# Patient Record
Sex: Male | Born: 1935 | Race: White | Hispanic: No | Smoking: Former smoker
Health system: Southern US, Community
[De-identification: ages and names within clinical notes are randomized; demographics above are authoritative.]

## PROBLEM LIST (undated history)

## (undated) DIAGNOSIS — E079 Disorder of thyroid, unspecified: Secondary | ICD-10-CM

## (undated) DIAGNOSIS — E039 Hypothyroidism, unspecified: Secondary | ICD-10-CM

## (undated) DIAGNOSIS — K219 Gastro-esophageal reflux disease without esophagitis: Secondary | ICD-10-CM

## (undated) DIAGNOSIS — I6529 Occlusion and stenosis of unspecified carotid artery: Secondary | ICD-10-CM

## (undated) DIAGNOSIS — I10 Essential (primary) hypertension: Secondary | ICD-10-CM

## (undated) DIAGNOSIS — F0390 Unspecified dementia without behavioral disturbance: Secondary | ICD-10-CM

## (undated) HISTORY — DX: Essential (primary) hypertension: I10

## (undated) HISTORY — DX: Gastro-esophageal reflux disease without esophagitis: K21.9

## (undated) HISTORY — PX: COLONOSCOPY: SHX174

## (undated) HISTORY — DX: Occlusion and stenosis of unspecified carotid artery: I65.29

## (undated) HISTORY — PX: INGUINAL HERNIA REPAIR: SHX194

## (undated) HISTORY — PX: EYE SURGERY: SHX253

## (undated) HISTORY — PX: ESOPHAGOGASTRODUODENOSCOPY: SHX1529

## (undated) HISTORY — PX: LIPOMA EXCISION: SHX5283

## (undated) HISTORY — PX: MELANOMA EXCISION: SHX5266

## (undated) HISTORY — DX: Disorder of thyroid, unspecified: E07.9

## (undated) HISTORY — PX: REPLACEMENT TOTAL KNEE BILATERAL: SUR1225

---

## 2000-06-13 ENCOUNTER — Encounter: Payer: Self-pay | Admitting: General Surgery

## 2000-06-13 ENCOUNTER — Encounter: Admission: RE | Admit: 2000-06-13 | Discharge: 2000-06-13 | Payer: Self-pay | Admitting: General Surgery

## 2000-06-14 ENCOUNTER — Encounter (INDEPENDENT_AMBULATORY_CARE_PROVIDER_SITE_OTHER): Payer: Self-pay | Admitting: Specialist

## 2000-06-14 ENCOUNTER — Ambulatory Visit (HOSPITAL_BASED_OUTPATIENT_CLINIC_OR_DEPARTMENT_OTHER): Admission: RE | Admit: 2000-06-14 | Discharge: 2000-06-14 | Payer: Self-pay | Admitting: General Surgery

## 2009-08-08 ENCOUNTER — Inpatient Hospital Stay (HOSPITAL_COMMUNITY): Admission: RE | Admit: 2009-08-08 | Discharge: 2009-08-11 | Payer: Self-pay | Admitting: Orthopedic Surgery

## 2011-01-06 LAB — BASIC METABOLIC PANEL
BUN: 15 mg/dL (ref 6–23)
BUN: 15 mg/dL (ref 6–23)
CO2: 27 mEq/L (ref 19–32)
CO2: 27 mEq/L (ref 19–32)
Calcium: 8.5 mg/dL (ref 8.4–10.5)
Chloride: 102 mEq/L (ref 96–112)
Creatinine, Ser: 1.49 mg/dL (ref 0.4–1.5)
GFR calc non Af Amer: 54 mL/min — ABNORMAL LOW (ref >60)
Glucose, Bld: 109 mg/dL — ABNORMAL HIGH (ref 70–99)
Glucose, Bld: 126 mg/dL — ABNORMAL HIGH (ref 70–99)

## 2011-01-06 LAB — COMPREHENSIVE METABOLIC PANEL
ALT: 25 U/L (ref 0–53)
AST: 26 U/L (ref 0–37)
Albumin: 3.7 g/dL (ref 3.5–5.2)
Alkaline Phosphatase: 71 U/L (ref 39–117)
BUN: 12 mg/dL (ref 6–23)
CO2: 28 mEq/L (ref 19–32)
Calcium: 9.4 mg/dL (ref 8.4–10.5)
Chloride: 103 mEq/L (ref 96–112)
Creatinine, Ser: 1.26 mg/dL (ref 0.4–1.5)
GFR calc Af Amer: >60 mL/min (ref >60)
GFR calc non Af Amer: 55 mL/min — ABNORMAL LOW (ref >60)
Glucose, Bld: 181 mg/dL — ABNORMAL HIGH (ref 70–99)
Potassium: 3.6 mEq/L (ref 3.5–5.1)
Sodium: 137 mEq/L (ref 135–145)
Total Bilirubin: 0.8 mg/dL (ref 0.3–1.2)
Total Protein: 7.3 g/dL (ref 6.0–8.3)

## 2011-01-06 LAB — CBC
HCT: 27.4 % — ABNORMAL LOW (ref 39.0–52.0)
HCT: 27.9 % — ABNORMAL LOW (ref 39.0–52.0)
HCT: 33.4 % — ABNORMAL LOW (ref 39.0–52.0)
HCT: 40.7 % (ref 39.0–52.0)
Hemoglobin: 13.6 g/dL (ref 13.0–17.0)
MCHC: 33.3 g/dL (ref 30.0–36.0)
MCHC: 33.9 g/dL (ref 30.0–36.0)
MCHC: 34 g/dL (ref 30.0–36.0)
MCV: 96.3 fL (ref 78.0–100.0)
MCV: 96.6 fL (ref 78.0–100.0)
MCV: 96.6 fL (ref 78.0–100.0)
Platelets: 136 10*3/uL — ABNORMAL LOW (ref 150–400)
Platelets: 154 10*3/uL (ref 150–400)
Platelets: 158 10*3/uL (ref 150–400)
Platelets: 167 10*3/uL (ref 150–400)
RBC: 4.21 MIL/uL — ABNORMAL LOW (ref 4.22–5.81)
RDW: 13.1 % (ref 11.5–15.5)
RDW: 13.2 % (ref 11.5–15.5)
RDW: 13.2 % (ref 11.5–15.5)
WBC: 11.4 10*3/uL — ABNORMAL HIGH (ref 4.0–10.5)
WBC: 5.7 10*3/uL (ref 4.0–10.5)

## 2011-01-06 LAB — PROTIME-INR
INR: 1.04 (ref 0.00–1.49)
INR: 1.64 — ABNORMAL HIGH (ref 0.00–1.49)
Prothrombin Time: 13.5 seconds (ref 11.6–15.2)
Prothrombin Time: 15.5 seconds — ABNORMAL HIGH (ref 11.6–15.2)
Prothrombin Time: 21.4 seconds — ABNORMAL HIGH (ref 11.6–15.2)

## 2011-01-06 LAB — URINALYSIS, ROUTINE W REFLEX MICROSCOPIC
Bilirubin Urine: NEGATIVE
Glucose, UA: 100 mg/dL — AB
Hgb urine dipstick: NEGATIVE
Ketones, ur: NEGATIVE mg/dL
Nitrite: NEGATIVE
Protein, ur: NEGATIVE mg/dL
Specific Gravity, Urine: 1.011 (ref 1.005–1.030)
Urobilinogen, UA: 0.2 mg/dL (ref 0.0–1.0)
pH: 6.5 (ref 5.0–8.0)

## 2011-01-06 LAB — APTT: aPTT: 28 seconds (ref 24–37)

## 2011-01-06 LAB — TYPE AND SCREEN: ABO/RH(D): O POS

## 2011-02-18 ENCOUNTER — Other Ambulatory Visit: Payer: Self-pay | Admitting: Orthopedic Surgery

## 2011-02-18 ENCOUNTER — Encounter (HOSPITAL_COMMUNITY): Payer: Medicare Other

## 2011-02-18 LAB — CBC
MCH: 30.2 pg (ref 26.0–34.0)
MCHC: 33.3 g/dL (ref 30.0–36.0)
MCV: 90.5 fL (ref 78.0–100.0)
Platelets: 172 10*3/uL (ref 150–400)
RBC: 4.54 MIL/uL (ref 4.22–5.81)
RDW: 13.2 % (ref 11.5–15.5)

## 2011-02-18 LAB — COMPREHENSIVE METABOLIC PANEL
ALT: 23 U/L (ref 0–53)
AST: 22 U/L (ref 0–37)
Alkaline Phosphatase: 96 U/L (ref 39–117)
CO2: 32 mEq/L (ref 19–32)
Chloride: 99 mEq/L (ref 96–112)
GFR calc Af Amer: 57 mL/min — ABNORMAL LOW (ref >60)
GFR calc non Af Amer: 47 mL/min — ABNORMAL LOW (ref >60)
Glucose, Bld: 90 mg/dL (ref 70–99)
Potassium: 4.1 mEq/L (ref 3.5–5.1)
Sodium: 140 mEq/L (ref 135–145)
Total Bilirubin: 0.3 mg/dL (ref 0.3–1.2)

## 2011-02-18 LAB — URINALYSIS, ROUTINE W REFLEX MICROSCOPIC
Bilirubin Urine: NEGATIVE
Ketones, ur: NEGATIVE mg/dL
Nitrite: NEGATIVE
Protein, ur: NEGATIVE mg/dL
pH: 7 (ref 5.0–8.0)

## 2011-02-18 LAB — TYPE AND SCREEN: ABO/RH(D): O POS

## 2011-02-18 LAB — PROTIME-INR: Prothrombin Time: 13.3 seconds (ref 11.6–15.2)

## 2011-02-19 NOTE — Op Note (Signed)
[place]. [facility]  Patient:    Christian Bennett, Christian Bennett                       MRN: [id number] Proc. Date: 06/14/00 Adm. Date:  [id number] Attending:  Arlis Porta CC:         Hadassah Pais. Jeannetta Nap, M.D.   Operative Report  PREOPERATIVE DIAGNOSES: 1. Left inguinal hernia. 2. Left back soft tissue mass.  POSTOPERATIVE DIAGNOSES: 1. Indirect left inguinal hernia. 2. Left back lipomatous soft tissue mass.  PROCEDURE: 1. Left inguinal hernia repair with mesh. 2. Excision of 7 cm left back soft tissue mass.  SURGEON:  Adolph Pollack, M.D.  ANESTHESIA:  Local with MAC.  INDICATIONS FOR PROCEDURE:  The patient is a 77 year old male, status post right inguinal hernia repair in the past who has noticed a left inguinal bulge that has becoming symptomatic.  He also has noted a soft tissue mass on his back and his daughter said it is getting larger.  He presents for operative management of both of these problems.  DESCRIPTION OF PROCEDURE:  He was placed supine on the operating table and given intravenous sedation.  The local groin area was shaved, and sterilely prepped and draped.  Local anesthetic consisting of 0.5% plain Marcaine plus 1% lidocaine with epinephrine plus sodium bicarbonate was infiltrated in the left groin region and an oblique incision was made sharply and carried down to the external oblique aponeurosis.  More local anesthetic was infiltrated deep to the external oblique aponeurosis, and it was incised, and its fibers were split through the external ring medically, and up toward the anterior-superior iliac spine laterally.  The upper midline ilioinguinal nerve was identified and preserved.  The shelving edge of the inguinal ligament inferiorly and the internal oblique muscle aponeurosis superiorly were exposed by using blunt dissection.  The spermatic cord was isolated using blunt dissection and direct hernia sac was dissected free from  the spermatic cord, opened, and extraperitoneal fatty contents reduced.  The sac was ligated with a 2-0 silk pursestring suture, and excess sac was excised, and sent to pathology.  Next, a piece of 3 inch x 6 inch mesh was brought into the field and anchored 1 cm medial to the pubic tubercle with 2-0 Prolene suture.  The inferior edge of the mesh was then sewn to the shelving edge of the inguinal ligament with a running 2-0 Prolene suture up to the level of the internal ring.  A split was made in the mesh which was wrapped around the spermatic cord and the superior aspect of the mesh was anchored to the internal oblique muscle and aponeurosis with interrupted 2-0 Vicryl sutures.  Two tails of the mesh were crossed, creating a new internal ring, and these were anchored to the shelving edge of the inguinal ligament with a single 2-0 Prolene stitch.  The tip of the hemostat was able to be placed through the new internal ring aperture.  Next, the tails of the mesh were stuffed laterally, deep to the external oblique aponeurosis, and the external oblique aponeurosis was closed over the mesh with a running Vicryl suture.  The Scarpas fascia was closed with a running Vicryl suture, and the skin was closed with a 4-0 Monocryl subcuticular stitch followed by Steri-Strips and sterile dressings.  The left testicle was in the scrotum at this time.  Next, the patient was turned into the right lateral decubitus position and the mass of the left  back was identified.  The area was shaved and sterilely prepped and draped.  Local anesthetic was infiltrated directly over the mass and a transverse incision made directly over it and carried through the subcutaneous tissue.  The capsule of the mass was incised.  The mass was lipomatous and using sharp and blunt dissection, was able to be removed and measured 7 cm.  After removal, bleeding points were controlled with the cautery.  The subcutaneous tissue was  then closed with a running 3-0 Vicryl suture.  The skin closed with a 4-0 Monocryl subcuticular stitch. Steri-Strips and a sterile dressing were applied.  He tolerated both procedures well without any apparent complications, and was taken to the recovery room in satisfactory condition. DD:  06/14/00 TD:  06/15/00 Job: [id number]

## 2011-02-22 ENCOUNTER — Inpatient Hospital Stay (HOSPITAL_COMMUNITY)
Admission: RE | Admit: 2011-02-22 | Discharge: 2011-02-25 | DRG: 470 | Disposition: A | Discharge status: Skilled Nursing Facility | Payer: Medicare Other | Source: Ambulatory Visit | Attending: Orthopedic Surgery | Admitting: Orthopedic Surgery

## 2011-02-22 DIAGNOSIS — E039 Hypothyroidism, unspecified: Secondary | ICD-10-CM | POA: Diagnosis present

## 2011-02-22 DIAGNOSIS — H9190 Unspecified hearing loss, unspecified ear: Secondary | ICD-10-CM | POA: Diagnosis present

## 2011-02-22 DIAGNOSIS — I10 Essential (primary) hypertension: Secondary | ICD-10-CM | POA: Diagnosis present

## 2011-02-22 DIAGNOSIS — K219 Gastro-esophageal reflux disease without esophagitis: Secondary | ICD-10-CM | POA: Diagnosis present

## 2011-02-22 DIAGNOSIS — K449 Diaphragmatic hernia without obstruction or gangrene: Secondary | ICD-10-CM | POA: Diagnosis present

## 2011-02-22 DIAGNOSIS — Z85820 Personal history of malignant melanoma of skin: Secondary | ICD-10-CM

## 2011-02-22 DIAGNOSIS — H9319 Tinnitus, unspecified ear: Secondary | ICD-10-CM | POA: Diagnosis present

## 2011-02-22 DIAGNOSIS — Z01812 Encounter for preprocedural laboratory examination: Secondary | ICD-10-CM

## 2011-02-22 DIAGNOSIS — M1710 Unilateral primary osteoarthritis, unspecified knee: Principal | ICD-10-CM | POA: Diagnosis present

## 2011-02-23 LAB — CBC
HCT: 32.1 % — ABNORMAL LOW (ref 39.0–52.0)
MCH: 30.4 pg (ref 26.0–34.0)
MCV: 91.2 fL (ref 78.0–100.0)
Platelets: 141 10*3/uL — ABNORMAL LOW (ref 150–400)
RDW: 13.4 % (ref 11.5–15.5)
WBC: 10.3 10*3/uL (ref 4.0–10.5)

## 2011-02-23 LAB — BASIC METABOLIC PANEL
CO2: 31 mEq/L (ref 19–32)
Chloride: 99 mEq/L (ref 96–112)
Creatinine, Ser: 1.6 mg/dL — ABNORMAL HIGH (ref 0.4–1.5)
GFR calc Af Amer: 51 mL/min — ABNORMAL LOW (ref >60)
Glucose, Bld: 126 mg/dL — ABNORMAL HIGH (ref 70–99)
Sodium: 138 mEq/L (ref 135–145)

## 2011-02-24 LAB — BASIC METABOLIC PANEL
BUN: 17 mg/dL (ref 6–23)
CO2: 30 mEq/L (ref 19–32)
Glucose, Bld: 97 mg/dL (ref 70–99)
Potassium: 3.7 mEq/L (ref 3.5–5.1)
Sodium: 136 mEq/L (ref 135–145)

## 2011-02-24 LAB — CBC
Hemoglobin: 10.3 g/dL — ABNORMAL LOW (ref 13.0–17.0)
MCH: 30 pg (ref 26.0–34.0)
MCHC: 33.2 g/dL (ref 30.0–36.0)
MCV: 90.4 fL (ref 78.0–100.0)
RBC: 3.43 MIL/uL — ABNORMAL LOW (ref 4.22–5.81)

## 2011-02-24 NOTE — Op Note (Signed)
NAME:  Christian, Bennett NO.:  [id number]  MEDICAL RECORD NO.:  [id number]           PATIENT TYPE:  I  LOCATION:  0003                         FACILITY:  [facility]  PHYSICIAN:  Ollen Gross, M.D.    DATE OF BIRTH:  [birth year]  DATE OF PROCEDURE: DATE OF DISCHARGE:                              OPERATIVE REPORT   PREOPERATIVE DIAGNOSIS:  Osteoarthritis, left knee.  POSTOPERATIVE DIAGNOSIS:  Osteoarthritis, left knee.  PROCEDURE:  Left total knee arthroplasty.  SURGEON:  Ollen Gross, M.D.  ASSISTANT:  Rozell Searing, [facility]  ANESTHESIA:  Spinal.  ESTIMATED BLOOD LOSS:  Minimal.  DRAINS:  Hemovac x1.  TOURNIQUET TIME:  40 minutes at 300 mmHg.  COMPLICATIONS:  None.  CONDITION:  Stable to recovery.  BRIEF CLINICAL NOTE:  [name] is an 77 year old male with end-stage arthritis of the left knee with progressively worsening pain and dysfunction.  He has failed nonoperative management and presents for total knee arthroplasty.  PROCEDURE IN DETAIL:  After successful initiation of spinal anesthetic, a tourniquet was placed high on his left thigh and his left lower extremity.  It was prepped and draped in usual sterile fashion. Extremities wrapped in Esmarch, knee flexed, tourniquet inflated to 300 mmHg.  Midline incision is made with 10 blade through subcutaneous tissue to the level of the extensor mechanism.  A fresh blade is used to make a medial parapatellar arthrotomy.  Soft tissue on the proximal medial tibia is subperiosteally elevated to the joint line with the knife into the semimembranosus bursa with a Cobb elevator.  Soft tissue laterally is elevated with attention being paid to avoiding patellar tendon on tibial tubercle.  Patella was everted, knee flexed 90 degrees, and ACL and PCL removed.  Drill was used create a starting hole in the distal femur.  The canal was thoroughly irrigated.  The 5-degree left valgus alignment guide is placed and the  distal femoral cutting block pinned to remove 11 mm off the distal femur.  Distal femoral resection is made with an oscillating saw.  Size 4 is most appropriate femoral component and then the rotations marked off the epicondylar axis.  Size 4 cutting block is placed and the anterior-posterior chamfer cuts made.  Tibia subluxed forward and the menisci removed.  Extramedullary tibial alignment guide is placed, referencing proximally at the medial aspect of the tibial tubercle and distally along the second metatarsal axis tibial crest.  The block is pinned to remove 2 mm off the more deficient medial side.  Tibial resection is made with an oscillating saw.  Size 4 is the most appropriate tibial component and the proximal tibia is prepared to modular drill and keel punch for the size 4.  Femoral preparation is completed with the intercondylar cut.  Trial size 4 and posterior stabilized femur was placed.  10-mm posterior stabilized rotating platform insert trial was placed.  The 10 full extensions achieved with excellent varus-valgus and anterior-posterior balance throughout full range of motion.  Patella was everted and thickness measured to be 27 mm.  Freehand resection taken to 15 mm, 41 template placed, lug holes were drilled, trial patella was  placed and it tracks normally.  Osteophytes removed off the posterior femur with the trial in place.  All trials were removed and the cut bone surfaces are prepared with pulsatile lavage.  Cement is mixed and once ready for implantation, the size 4 mobile bearing tibial tray, size 4 posterior stabilized femur, and 41 patella are cemented into place.  The patella was held with a clamp.  Trial 10-mm inserts placed, knee held in full extension and all extruded cement removed.  When the cement is fully hardened, then the permanent 10-mm posterior stabilized rotating platform insert is placed into the tibial tray.  The wound was copiously irrigated  with saline solution and then the arthrotomy closed over Hemovac drain with interrupted #1 PDS.  Flexion against gravity is 135 degrees.  Tourniquet released total time of 40 minutes.  Subcu closed with interrupted 2-0 Vicryl and subcuticular running 4-0 Monocryl.  The catheter for Marcaine pain pump was placed and pumps initiated.  Incisions cleaned and dried and Steri-Strips and bulky sterile dressing were applied.  He is then placed into a knee immobilizer, awakened and transported to recovery in stable condition.     Ollen Gross, M.D.     FA/MEDQ  D:  02/22/2011  T:  02/22/2011  Job:  [id number]  Electronically Signed by Ollen Gross M.D. on 02/24/2011 12:56:09 PM

## 2011-02-25 LAB — CBC
HCT: 31.2 % — ABNORMAL LOW (ref 39.0–52.0)
Hemoglobin: 10.2 g/dL — ABNORMAL LOW (ref 13.0–17.0)
MCH: 29.7 pg (ref 26.0–34.0)
MCHC: 32.7 g/dL (ref 30.0–36.0)
MCV: 90.7 fL (ref 78.0–100.0)

## 2011-02-25 LAB — PROTIME-INR: INR: 1.33 (ref 0.00–1.49)

## 2011-03-06 NOTE — Discharge Summary (Signed)
NAME:  Christian Bennett, Christian Bennett NO.:  [id number]  MEDICAL RECORD NO.:  [id number]           PATIENT TYPE:  I  LOCATION:  1611                         FACILITY:  [facility]  PHYSICIAN:  Ollen Gross, M.D.    DATE OF BIRTH:  December 29, 1927  DATE OF ADMISSION:  02/22/2011 DATE OF DISCHARGE:  02/25/2011                        DISCHARGE SUMMARY - REFERRING   ADMITTING DIAGNOSES: 1. Osteoarthritis, left knee. 2. Impaired hearing. 3. Tinnitus. 4. Prior history of cataracts. 5. Hypertension. 6. Hiatal hernia. 7. Gastroesophageal reflux disease. 8. Hemorrhoids. 9. History of irritable bowel syndrome. 10.Hypothyroidism. 11.History of bursitis. 12.History of skin melanoma. 13.History of eczema.  DISCHARGE DIAGNOSES: 1. Osteoarthritis, left knee, status post left total knee replacement     arthroplasty. 2. Impaired hearing. 3. Tinnitus. 4. Prior history of cataracts. 5. Hypertension. 6. Hiatal hernia. 7. Gastroesophageal reflux disease. 8. Hemorrhoids. 9. History of irritable bowel syndrome. 10.Hypothyroidism. 11.History of bursitis. 12.History of skin melanoma. 13.History of eczema.  PROCEDURE:  Feb 22, 2011, left total knee.  SURGEON:  Ollen Gross, M.D.  ASSISTANT:  Rozell Searing, [facility]  ANESTHESIA:  Spinal anesthesia.  TOURNIQUET TIME:  40 minutes.  CONSULTS:  None.  BRIEF HISTORY:  Christian Bennett is an 77 year old male with end-stage arthritis of the left knee with progressive worsening and pain dysfunction.  He has failed nonoperative management and now presents for total knee arthroplasty.  LABORATORY DATA:  Preop CBC showed hemoglobin of 13.7, hematocrit of 41.1, white cell count 6, platelets 172,000.  PT/INR 13.3 and 0.99 with a PTT of 29 on admission.  Chem panel all within normal limits.  Preop UA negative.  Blood group type O positive.  Positive for Staphylococcus aureus.  Negative for MRSA on nasal swabs.  Serial CBCs were followed throughout the  hospital course.  Hemoglobin dropped down to 10.7, then 10.3 stabilized.  Last H and H 10.2 and 31.2.  Serial protimes followed per Coumadin protocol.  Last PT/INR 16.7 and 1.33.  Serial BMETs were followed for 48 hours.  Electrolytes all remained within normal limits but glucose went from 90 to 126, back down to 97.  EKG dated January 07, 2011, first-degree AV block, old PVCs.  HOSPITAL COURSE:  The patient was admitted [facility], taken to OR, underwent above-stated procedure without complication.  The patient tolerated the procedure well, later transferred to recovery room on orthopedic floor, started on p.o. and IV analgesic for pain control following surgery.  Had some problems with some nausea and we switched over from his oral pain pill to different one.  He did want to look into [facility], so we got social work involved.  He had good urinary output.  We started back on his home meds.  Started getting up out of bed on day 1.  By day 2, he was doing a little bit better.  Pain was under better control, especially his nausea was improved after switching over to different medications.  He was getting up, walking over 100 feet by day 2, dressing changed, incision looked good.  By day 3, he was feeling well, moving his bowels.  No complaints on  discharge over to [facility].  DISCHARGE PLAN: 1. The patient will be transferred to [facility] on Feb 25, 2011. 2. Discharge diagnoses, please see above.  DISCHARGE MEDICATIONS:  Current medications at time of transfer include: 1. Coumadin protocol.  Please titrate the Coumadin level for target     INR between 2.0 and 3.0, needs to be on Coumadin for 3 weeks from     the date of surgery. 2. Lovenox 40 mg subcu injection daily for 3 more days, then     discontinue the Lovenox. 3. Colace 100 mg p.o. b.i.d. 4. Levothyroxine 88 mcg p.o. q.a.m. 5. Prilosec 20 mg daily. 6. Furosemide 80  mg 2 tablets in the morning and 1 to 2 tablets in the     evening. 7. Robaxin 500 mg p.o. q.6-8h. p.r.n. spasm. 8. Tylenol 325 one or two every 4 to 6 hours as needed for mild pain,     temperature or headache. 9. Dilaudid 2 mg one or two every 4 to 6 hours as needed for moderate     pain. 10.Tramadol 50 mg 1 to 2 tablets every 6 hours as needed for mild     pain.  DIET:  Diet as tolerated.  ACTIVITY:  He is weightbearing as tolerated.  Total knee protocol.  PT and OT for gait training, ambulation, ADLs, range of motion, strengthening exercises.  Please note he may start showering, however do not submerge incision under water.  FOLLOWUP:  He needs to follow up with Dr. Deri Fuelling office in 2 to 2-1/2 weeks following his surgery.  Please contact the office at [phone] and help make appointment either on Tuesday, June 5, or Thursday, June 7.  DISPOSITION:  [facility].  CONDITION ON DISCHARGE:  Improving.     Alexzandrew L. Julien Girt, P.A.C.   ______________________________ Ollen Gross, M.D.    ALP/MEDQ  D:  02/25/2011  T:  02/25/2011  Job:  [id number]  cc:   Windle Guard, M.D. Fax: [phone]  Electronically Signed by Patrica Duel P.A.C. on 03/02/2011 07:10:25 AM Electronically Signed by Ollen Gross M.D. on 03/06/2011 04:05:53 PM

## 2011-03-06 NOTE — H&P (Signed)
NAME:  Christian Bennett, TICAS NO.:  [id number]  MEDICAL RECORD NO.:  [id number]           PATIENT TYPE:  LOCATION:                                 FACILITY:  PHYSICIAN:  Ollen Gross, M.D.    DATE OF BIRTH:  [birth year]  DATE OF ADMISSION: DATE OF DISCHARGE:                             HISTORY & PHYSICAL   DATE OF ADMISSION:  Feb 22, 2011  CHIEF COMPLAINT:  Left knee pain.  HISTORY OF PRESENT ILLNESS:  The patient is an 77 year old male who has been seen by Dr. Lequita Halt for ongoing knee pain.  He has had previous right total knee done back in November 2010.  The left knee continues to be problematic and he is ready to have the other side done.  He has been seen preoperatively by Dr. Jeannetta Nap, felt to be stable for surgery.  ALLERGIES:  DOXYCYCLINE causes hives breaking out on his hands.  INTOLERANCES:  AMOXICILLIN causes stomach cramps.  Questionable food allergy to [facility], but he has been eaten it several times since 1 episode and he has had no problems.  CURRENT MEDICATIONS: 1. Levothyroxine 88 mcg. 2. Prilosec or Zegerid over the counter. 3. Lasix. 4. Cortisone cream topical.  PAST MEDICAL HISTORY: 1. Impaired hearing. 2. Tinnitus. 3. Previous history of cataracts. 4. Hypertension. 5. Hiatal hernia. 6. Gastroesophageal reflux disease. 7. Hemorrhoids. 8. History of irritable bowel syndrome. 9. Hypothyroidism. 10.History of bursitis. 11.History of skin melanoma. 12.History of eczema.  PAST SURGICAL HISTORY: 1. Hernia surgery in 2001. 2. Excision of lipoma on his back, 2001. 3. Cataract excision of bilateral eyes, 2010. 4. Right total knee replacement in November 2010.  FAMILY HISTORY:  Father deceased in his 57s with hypertension.  Mother deceased secondary to stroke and hypertension, she was 35.  He has a brother living with coronary arterial disease.  Another sister deceased secondary to MI.  Another brother who is deceased.  SOCIAL HISTORY:   Widowed, self employed, retired, quit smoking many years ago.  Lives alone.  Does want to look into a rehab facility, possibly [facility].  Does have a living will and healthcare power of attorney.  REVIEW OF SYSTEMS:  GENERAL:  No fevers, chills, or night sweats. NEURO:  Little bit of ringing and tinnitus.  No seizures, syncope, or paralysis.  RESPIRATORY:  No shortness breath, productive cough, or hemoptysis.  CARDIOVASCULAR:  No chest pain or orthopnea.  GI:  No nausea, vomiting, diarrhea, constipation, although does have some heartburn.  GU:  Little bit of nocturia.  No dysuria, hematuria. MUSCULOSKELETAL:  Left knee pain.  PHYSICAL EXAMINATION:  VITAL SIGNS:  Pulse 72, respirations 12, blood pressure 148/72. GENERAL:  77 year old white male, thin frame, in no acute distress, alert, oriented, and cooperative. HEENT:  Normocephalic, atraumatic.  Pupils round and reactive.  EOMs intact. NECK:  Supple. CHEST:  Clear. HEART:  Regular rate and rhythm without murmur, S1-S2 noted. ABDOMEN:  Soft, nontender.  Bowel sounds present. RECTAL/BREASTS/GENITALIA:  Not done, not part of present illness. EXTREMITIES:  Left knee, no swelling, moderate crepitus, tender more medial than lateral.  No instability.  IMPRESSION:  Osteoarthritis, left knee.  PLAN:  The patient admitted to [facility] to undergo a left total knee replacement arthroplasty.  Surgery will be performed by Dr. Ollen Gross.     Alexzandrew L. Julien Girt, P.A.C.   ______________________________ Ollen Gross, M.D.    ALP/MEDQ  D:  02/21/2011  T:  02/22/2011  Job:  [id number]  cc:   Windle Guard, M.D. Fax: [phone]  Electronically Signed by Patrica Duel P.A.C. on 03/02/2011 07:10:18 AM Electronically Signed by Ollen Gross M.D. on 03/06/2011 04:05:50 PM

## 2012-07-11 ENCOUNTER — Other Ambulatory Visit: Payer: Self-pay | Admitting: Dermatology

## 2014-01-25 ENCOUNTER — Other Ambulatory Visit: Payer: Self-pay | Admitting: Specialist

## 2014-01-25 DIAGNOSIS — M5136 Other intervertebral disc degeneration, lumbar region: Secondary | ICD-10-CM

## 2014-01-25 DIAGNOSIS — M5126 Other intervertebral disc displacement, lumbar region: Secondary | ICD-10-CM

## 2014-02-05 ENCOUNTER — Ambulatory Visit
Admission: RE | Admit: 2014-02-05 | Discharge: 2014-02-05 | Disposition: A | Discharge status: Home / Self Care | Payer: Medicare Other | Source: Ambulatory Visit | Attending: Specialist | Admitting: Specialist

## 2014-02-05 ENCOUNTER — Ambulatory Visit
Admission: RE | Admit: 2014-02-05 | Discharge: 2014-02-05 | Disposition: A | Discharge status: Home / Self Care | Payer: BLUE CROSS/BLUE SHIELD | Source: Ambulatory Visit | Attending: Specialist | Admitting: Specialist

## 2014-02-05 VITALS — BP 149/76 | HR 58 | Ht 70.0 in | Wt 175.0 lb

## 2014-02-05 DIAGNOSIS — M51369 Other intervertebral disc degeneration, lumbar region without mention of lumbar back pain or lower extremity pain: Secondary | ICD-10-CM

## 2014-02-05 DIAGNOSIS — M5136 Other intervertebral disc degeneration, lumbar region: Secondary | ICD-10-CM

## 2014-02-05 DIAGNOSIS — M5126 Other intervertebral disc displacement, lumbar region: Secondary | ICD-10-CM

## 2014-02-05 MED ORDER — IOHEXOL 180 MG/ML  SOLN: 15.0000 mL | Freq: Once | INTRAMUSCULAR | Status: AC | PRN

## 2014-02-05 MED ORDER — DIAZEPAM 5 MG PO TABS
5.0000 mg | ORAL_TABLET | Freq: Once | ORAL | Status: AC
  Administered 2014-02-05: 5 mg via ORAL

## 2014-02-05 NOTE — Discharge Instructions (Signed)

## 2015-12-09 DIAGNOSIS — R0602 Shortness of breath: Secondary | ICD-10-CM | POA: Diagnosis not present

## 2015-12-09 DIAGNOSIS — I10 Essential (primary) hypertension: Secondary | ICD-10-CM | POA: Diagnosis not present

## 2015-12-09 DIAGNOSIS — L299 Pruritus, unspecified: Secondary | ICD-10-CM | POA: Diagnosis not present

## 2015-12-30 DIAGNOSIS — K529 Noninfective gastroenteritis and colitis, unspecified: Secondary | ICD-10-CM | POA: Diagnosis not present

## 2016-01-21 DIAGNOSIS — D649 Anemia, unspecified: Secondary | ICD-10-CM | POA: Diagnosis not present

## 2016-01-21 DIAGNOSIS — R5381 Other malaise: Secondary | ICD-10-CM | POA: Diagnosis not present

## 2016-01-21 DIAGNOSIS — R630 Anorexia: Secondary | ICD-10-CM | POA: Diagnosis not present

## 2016-01-21 DIAGNOSIS — R110 Nausea: Secondary | ICD-10-CM | POA: Diagnosis not present

## 2016-01-29 DIAGNOSIS — R5381 Other malaise: Secondary | ICD-10-CM | POA: Diagnosis not present

## 2016-01-29 DIAGNOSIS — D649 Anemia, unspecified: Secondary | ICD-10-CM | POA: Diagnosis not present

## 2016-01-29 DIAGNOSIS — R110 Nausea: Secondary | ICD-10-CM | POA: Diagnosis not present

## 2016-01-30 DIAGNOSIS — D649 Anemia, unspecified: Secondary | ICD-10-CM | POA: Diagnosis not present

## 2016-01-30 DIAGNOSIS — R110 Nausea: Secondary | ICD-10-CM | POA: Diagnosis not present

## 2016-02-03 DIAGNOSIS — D649 Anemia, unspecified: Secondary | ICD-10-CM | POA: Diagnosis not present

## 2016-02-18 DIAGNOSIS — R634 Abnormal weight loss: Secondary | ICD-10-CM | POA: Diagnosis not present

## 2016-02-18 DIAGNOSIS — R5383 Other fatigue: Secondary | ICD-10-CM | POA: Diagnosis not present

## 2016-02-18 DIAGNOSIS — N402 Nodular prostate without lower urinary tract symptoms: Secondary | ICD-10-CM | POA: Diagnosis not present

## 2016-02-18 DIAGNOSIS — Z1211 Encounter for screening for malignant neoplasm of colon: Secondary | ICD-10-CM | POA: Diagnosis not present

## 2016-02-20 ENCOUNTER — Other Ambulatory Visit: Payer: Self-pay | Admitting: Family Medicine

## 2016-02-20 DIAGNOSIS — R110 Nausea: Secondary | ICD-10-CM

## 2016-02-20 DIAGNOSIS — R634 Abnormal weight loss: Secondary | ICD-10-CM

## 2016-02-20 DIAGNOSIS — K529 Noninfective gastroenteritis and colitis, unspecified: Secondary | ICD-10-CM

## 2016-02-20 DIAGNOSIS — R5383 Other fatigue: Secondary | ICD-10-CM

## 2016-02-21 DIAGNOSIS — R197 Diarrhea, unspecified: Secondary | ICD-10-CM | POA: Diagnosis not present

## 2016-02-26 ENCOUNTER — Ambulatory Visit
Admission: RE | Admit: 2016-02-26 | Discharge: 2016-02-26 | Disposition: A | Discharge status: Home / Self Care | Payer: Medicare Other | Source: Ambulatory Visit | Attending: Family Medicine | Admitting: Family Medicine

## 2016-02-26 DIAGNOSIS — R634 Abnormal weight loss: Secondary | ICD-10-CM

## 2016-02-26 DIAGNOSIS — R5383 Other fatigue: Secondary | ICD-10-CM

## 2016-02-26 DIAGNOSIS — K529 Noninfective gastroenteritis and colitis, unspecified: Secondary | ICD-10-CM

## 2016-02-26 DIAGNOSIS — R110 Nausea: Secondary | ICD-10-CM

## 2016-02-26 MED ORDER — IOPAMIDOL (ISOVUE-300) INJECTION 61%
100.0000 mL | Freq: Once | INTRAVENOUS | Status: AC | PRN
  Administered 2016-02-26: 100 mL via INTRAVENOUS

## 2016-03-03 DIAGNOSIS — I10 Essential (primary) hypertension: Secondary | ICD-10-CM | POA: Diagnosis not present

## 2016-03-03 DIAGNOSIS — H6123 Impacted cerumen, bilateral: Secondary | ICD-10-CM | POA: Diagnosis not present

## 2016-03-10 DIAGNOSIS — R5383 Other fatigue: Secondary | ICD-10-CM | POA: Diagnosis not present

## 2016-03-10 DIAGNOSIS — S81851A Open bite, right lower leg, initial encounter: Secondary | ICD-10-CM | POA: Diagnosis not present

## 2016-03-15 DIAGNOSIS — R634 Abnormal weight loss: Secondary | ICD-10-CM | POA: Diagnosis not present

## 2016-03-15 DIAGNOSIS — R197 Diarrhea, unspecified: Secondary | ICD-10-CM | POA: Diagnosis not present

## 2016-03-15 DIAGNOSIS — R5383 Other fatigue: Secondary | ICD-10-CM | POA: Diagnosis not present

## 2016-03-18 DIAGNOSIS — K635 Polyp of colon: Secondary | ICD-10-CM | POA: Diagnosis not present

## 2016-03-18 DIAGNOSIS — Z86010 Personal history of colonic polyps: Secondary | ICD-10-CM | POA: Diagnosis not present

## 2016-03-18 DIAGNOSIS — D123 Benign neoplasm of transverse colon: Secondary | ICD-10-CM | POA: Diagnosis not present

## 2016-03-18 DIAGNOSIS — R1310 Dysphagia, unspecified: Secondary | ICD-10-CM | POA: Diagnosis not present

## 2016-03-18 DIAGNOSIS — K3189 Other diseases of stomach and duodenum: Secondary | ICD-10-CM | POA: Diagnosis not present

## 2016-03-18 DIAGNOSIS — K529 Noninfective gastroenteritis and colitis, unspecified: Secondary | ICD-10-CM | POA: Diagnosis not present

## 2016-03-18 DIAGNOSIS — R197 Diarrhea, unspecified: Secondary | ICD-10-CM | POA: Diagnosis not present

## 2016-03-18 DIAGNOSIS — K5730 Diverticulosis of large intestine without perforation or abscess without bleeding: Secondary | ICD-10-CM | POA: Diagnosis not present

## 2016-03-18 DIAGNOSIS — Z800 Family history of malignant neoplasm of digestive organs: Secondary | ICD-10-CM | POA: Diagnosis not present

## 2016-03-18 DIAGNOSIS — R634 Abnormal weight loss: Secondary | ICD-10-CM | POA: Diagnosis not present

## 2016-03-24 DIAGNOSIS — I10 Essential (primary) hypertension: Secondary | ICD-10-CM | POA: Diagnosis not present

## 2016-03-29 DIAGNOSIS — R197 Diarrhea, unspecified: Secondary | ICD-10-CM | POA: Diagnosis not present

## 2016-03-29 DIAGNOSIS — R634 Abnormal weight loss: Secondary | ICD-10-CM | POA: Diagnosis not present

## 2016-04-14 DIAGNOSIS — E039 Hypothyroidism, unspecified: Secondary | ICD-10-CM | POA: Diagnosis not present

## 2016-04-14 DIAGNOSIS — I10 Essential (primary) hypertension: Secondary | ICD-10-CM | POA: Diagnosis not present

## 2016-04-15 ENCOUNTER — Other Ambulatory Visit: Payer: Self-pay | Admitting: Family Medicine

## 2016-04-15 DIAGNOSIS — L989 Disorder of the skin and subcutaneous tissue, unspecified: Secondary | ICD-10-CM

## 2016-04-21 ENCOUNTER — Ambulatory Visit
Admission: RE | Admit: 2016-04-21 | Discharge: 2016-04-21 | Disposition: A | Discharge status: Home / Self Care | Payer: Medicare Other | Source: Ambulatory Visit | Attending: Family Medicine | Admitting: Family Medicine

## 2016-04-21 DIAGNOSIS — R197 Diarrhea, unspecified: Secondary | ICD-10-CM | POA: Diagnosis not present

## 2016-04-21 DIAGNOSIS — S4991XA Unspecified injury of right shoulder and upper arm, initial encounter: Secondary | ICD-10-CM | POA: Diagnosis not present

## 2016-04-21 DIAGNOSIS — L989 Disorder of the skin and subcutaneous tissue, unspecified: Secondary | ICD-10-CM

## 2016-04-21 DIAGNOSIS — R634 Abnormal weight loss: Secondary | ICD-10-CM | POA: Diagnosis not present

## 2016-06-02 DIAGNOSIS — L309 Dermatitis, unspecified: Secondary | ICD-10-CM | POA: Diagnosis not present

## 2016-06-02 DIAGNOSIS — L821 Other seborrheic keratosis: Secondary | ICD-10-CM | POA: Diagnosis not present

## 2016-06-02 DIAGNOSIS — L570 Actinic keratosis: Secondary | ICD-10-CM | POA: Diagnosis not present

## 2016-06-02 DIAGNOSIS — D485 Neoplasm of uncertain behavior of skin: Secondary | ICD-10-CM | POA: Diagnosis not present

## 2016-06-02 DIAGNOSIS — Z85820 Personal history of malignant melanoma of skin: Secondary | ICD-10-CM | POA: Diagnosis not present

## 2016-07-21 DIAGNOSIS — R197 Diarrhea, unspecified: Secondary | ICD-10-CM | POA: Diagnosis not present

## 2016-07-21 DIAGNOSIS — R634 Abnormal weight loss: Secondary | ICD-10-CM | POA: Diagnosis not present

## 2016-08-04 DIAGNOSIS — I10 Essential (primary) hypertension: Secondary | ICD-10-CM | POA: Diagnosis not present

## 2016-08-24 DIAGNOSIS — E039 Hypothyroidism, unspecified: Secondary | ICD-10-CM | POA: Diagnosis not present

## 2016-08-24 DIAGNOSIS — D649 Anemia, unspecified: Secondary | ICD-10-CM | POA: Diagnosis not present

## 2016-08-24 DIAGNOSIS — I10 Essential (primary) hypertension: Secondary | ICD-10-CM | POA: Diagnosis not present

## 2016-08-24 DIAGNOSIS — N189 Chronic kidney disease, unspecified: Secondary | ICD-10-CM | POA: Diagnosis not present

## 2016-08-24 DIAGNOSIS — E785 Hyperlipidemia, unspecified: Secondary | ICD-10-CM | POA: Diagnosis not present

## 2016-09-06 DIAGNOSIS — L03031 Cellulitis of right toe: Secondary | ICD-10-CM | POA: Diagnosis not present

## 2016-09-08 DIAGNOSIS — L600 Ingrowing nail: Secondary | ICD-10-CM | POA: Diagnosis not present

## 2016-09-23 DIAGNOSIS — I10 Essential (primary) hypertension: Secondary | ICD-10-CM | POA: Diagnosis not present

## 2016-09-23 DIAGNOSIS — E039 Hypothyroidism, unspecified: Secondary | ICD-10-CM | POA: Diagnosis not present

## 2016-09-23 DIAGNOSIS — D509 Iron deficiency anemia, unspecified: Secondary | ICD-10-CM | POA: Diagnosis not present

## 2016-12-13 DIAGNOSIS — R55 Syncope and collapse: Secondary | ICD-10-CM | POA: Diagnosis not present

## 2016-12-13 DIAGNOSIS — Z79899 Other long term (current) drug therapy: Secondary | ICD-10-CM | POA: Diagnosis not present

## 2016-12-13 DIAGNOSIS — R42 Dizziness and giddiness: Secondary | ICD-10-CM | POA: Diagnosis not present

## 2017-05-04 DIAGNOSIS — R634 Abnormal weight loss: Secondary | ICD-10-CM | POA: Diagnosis not present

## 2017-05-04 DIAGNOSIS — R197 Diarrhea, unspecified: Secondary | ICD-10-CM | POA: Diagnosis not present

## 2017-06-22 ENCOUNTER — Encounter: Payer: Self-pay | Admitting: Family Medicine

## 2017-06-22 ENCOUNTER — Ambulatory Visit (INDEPENDENT_AMBULATORY_CARE_PROVIDER_SITE_OTHER): Payer: Medicare Other | Admitting: Family Medicine

## 2017-06-22 VITALS — BP 130/72 | HR 67 | Ht 70.0 in | Wt 152.6 lb

## 2017-06-22 DIAGNOSIS — F39 Unspecified mood [affective] disorder: Secondary | ICD-10-CM | POA: Diagnosis not present

## 2017-06-22 DIAGNOSIS — K861 Other chronic pancreatitis: Secondary | ICD-10-CM | POA: Insufficient documentation

## 2017-06-22 DIAGNOSIS — E039 Hypothyroidism, unspecified: Secondary | ICD-10-CM | POA: Insufficient documentation

## 2017-06-22 DIAGNOSIS — Z9889 Other specified postprocedural states: Secondary | ICD-10-CM | POA: Diagnosis not present

## 2017-06-22 DIAGNOSIS — K219 Gastro-esophageal reflux disease without esophagitis: Secondary | ICD-10-CM | POA: Insufficient documentation

## 2017-06-22 DIAGNOSIS — Z87891 Personal history of nicotine dependence: Secondary | ICD-10-CM | POA: Insufficient documentation

## 2017-06-22 DIAGNOSIS — Z1321 Encounter for screening for nutritional disorder: Secondary | ICD-10-CM | POA: Diagnosis not present

## 2017-06-22 DIAGNOSIS — I10 Essential (primary) hypertension: Secondary | ICD-10-CM

## 2017-06-22 NOTE — Patient Instructions (Addendum)
- Patient will calm in fasting for blood work in the very near future sometime this week.  He will make an appointment for that before he leaves today.  Patient prefers to be called at home with the results rather than coming in to discuss it.   He will follow-up in approximately 3 months for review of his thyroid, blood pressure, and any other things that may be going on.     How to Increase Your Level of Physical Activity  Getting regular physical activity is important for your overall health and well-being. Most people do not get enough exercise. There are easy ways to increase your level of physical activity, even if you have not been very active in the past or you are just starting out. Why is physical activity important? Physical activity has many short-term and long-term health benefits. Regular exercise can:  Help you lose weight or maintain a healthy weight.  Strengthen your muscles and bones.  Boost your mood and improve self-esteem.  Reduce your risk of certain long-term (chronic) diseases, like heart disease, cancer, and diabetes.  Help you stay capable of walking and moving around (mobile) as you age.  Prevent accidents, such as falls, as you age.  Increase life expectancy.  What are the benefits of being physically active on a regular basis? In addition to improving your physical health, being physically active on most days of the week can help you in ways that you may not expect. Benefits of regular physical activity may include:  Feeling good about your body.  Being able to move around more easily and for longer periods of time without getting tired (increased stamina).  Finding new sources of fun and enjoyment.  Meeting new people who share a common interest.  Being able to fight off illness better (enhanced immunity).  Being able to sleep better.  What can happen if I am not physically active on a regular basis? Not getting enough physical activity can  lead to an unhealthy lifestyle and future health problems. This can increase your chances of:  Becoming overweight or obese.  Becoming sick.  Developing chronic illnesses, like heart disease or diabetes.  Having mental health problems, like depression or anxiety.  Having sleep problems.  Having trouble walking or getting yourself around (reduced mobility).  Injuring yourself in a fall as you get older.  What steps can I take to be more physically active?  Check with your health care provider about how to get started. Ask your health care provider what activities are safe for you.  Start out slowly. Walking or doing some simple chair exercises is a good place to start, especially if you have not been active before or for a long time.  Try to find activities that you enjoy. You are more likely to commit to an exercise routine if it does not feel like a chore.  If you have bone or joint problems, choose low-impact exercises, like walking or swimming.  Include physical activity in your everyday routine.  Invite friends or family members to exercise with you. This also will help you commit to your workout plan.  Set goals that you can work toward.  Aim for at least 150 minutes of moderate-intensity exercise each week. Examples of moderate-intensity exercise include walking or riding a bike. Where to find more information:  Centers for Disease Control and Prevention: BowlingGrip.is  President's Council on [employer], Sports & Nutrition www.http://villegas.org/  ChooseMyPlate: [employer]gages.dk Contact a health care provider if:  You  have headaches, muscle aches, or joint pain.  You feel dizzy or light-headed while exercising.  You faint.  You have chest pain while exercising. Summary  Exercise benefits your mind and body at any age, even if you are just starting out.  If you have a chronic illness or have not been  active for a while, check with your health care provider before increasing your physical activity.  Choose activities that are safe and enjoyable for you.Ask your health care provider what activities are safe for you.  Start slowly. Tell your health care provider if you have problems as you start to increase your activity level. This information is not intended to replace advice given to you by your health care provider. Make sure you discuss any questions you have with your health care provider. Document Released: 09/09/2016 Document Revised: 09/09/2016 Document Reviewed: 09/09/2016 Elsevier Interactive Patient Education  [employer].

## 2017-06-22 NOTE — Progress Notes (Signed)
New patient office visit note:  Impression and Recommendations:    1. Encounter for vitamin deficiency screening   2. Hypertension, unspecified type   3. Gastroesophageal reflux disease, esophagitis presence not specified   4. Hx of colonoscopy   5. Mood disorder ([place])   6. Hypothyroidism, unspecified type   7. Chronic pancreatitis, unspecified pancreatitis type ([place])   8. History of smoking 30 or more pack years      Encounter for vitamin deficiency screening - Plan: VITAMIN D 25 Hydroxy (Vit-D Deficiency, Fractures)  Hypertension, unspecified type - Plan: CBC with Differential/Platelet, Comprehensive metabolic panel, Amylase, Lipase, Magnesium, Phosphorus, TSH, T4, free, Lipid panel  Gastroesophageal reflux disease, esophagitis presence not specified - Plan: CBC with Differential/Platelet, Comprehensive metabolic panel, Amylase, Lipase, Magnesium, Phosphorus, TSH, T4, free, Lipid panel  Hx of colonoscopy  Mood disorder (HCC) - Plan: CBC with Differential/Platelet, Comprehensive metabolic panel, Amylase, Lipase, Magnesium, Phosphorus, TSH, T4, free, Lipid panel  Hypothyroidism, unspecified type - Plan: CBC with Differential/Platelet, Comprehensive metabolic panel, Amylase, Lipase, Magnesium, Phosphorus, TSH, T4, free, Lipid panel  Chronic pancreatitis, unspecified pancreatitis type ([facility]) - Plan: CBC with Differential/Platelet, Comprehensive metabolic panel, Amylase, Lipase, Magnesium, Phosphorus, TSH, T4, free, Lipid panel  History of smoking 30 or more pack years - Plan: CBC with Differential/Platelet, Comprehensive metabolic panel, Amylase, Lipase, Magnesium, Phosphorus, TSH, T4, free, Lipid panel   No problem-specific Assessment & Plan notes found for this encounter.   The patient was counseled, risk factors were discussed, anticipatory guidance given.   New Prescriptions   TRIAMCINOLONE ACETONIDE (TRIAMCINOLONE 0.1 % CREAM : EUCERIN) CREA    Apply 1 application  topically 2 (two) times daily as needed for rash or itching.    No orders of the defined types were placed in this encounter.   Discontinued Medications   THYROID (ARMOUR) 60 MG TABLET    Take 60 mg by mouth daily before breakfast.    Modified Medications   Modified Medication Previous Medication   AMLODIPINE (NORVASC) 5 MG TABLET amLODipine (NORVASC) 5 MG tablet      Take 1 tablet (5 mg total) by mouth daily.    Take 5 mg by mouth daily.   CHLORTHALIDONE (HYGROTON) 25 MG TABLET chlorthalidone (HYGROTON) 25 MG tablet      Take 1 tablet (25 mg total) by mouth daily.    Take 25 mg by mouth daily.   NP THYROID 60 MG TABLET thyroid (ARMOUR) 60 MG tablet      TAKE 1 TABLET BY MOUTH ONCE DAILY BEFORE BREAKFAST    Take 1 tablet (60 mg total) by mouth daily before breakfast.   SERTRALINE (ZOLOFT) 25 MG TABLET sertraline (ZOLOFT) 25 MG tablet      Take 1 tablet (25 mg total) by mouth daily.    Take 25 mg by mouth daily.    Orders Placed This Encounter  Procedures  . CBC with Differential/Platelet  . Comprehensive metabolic panel  . Amylase  . Lipase  . Magnesium  . Phosphorus  . TSH  . T4, free  . Lipid panel  . VITAMIN D 25 Hydroxy (Vit-D Deficiency, Fractures)     Gross side effects, risk and benefits, and alternatives of medications discussed with patient.  Patient is aware that all medications have potential side effects and we are unable to predict every side effect or drug-drug interaction that may occur.  Expresses verbal understanding and consents to current therapy plan and treatment regimen.  Return in about 3 months (around  09/21/2017) for BP, thyroid, .  Please see AVS handed out to patient at the end of our visit for further patient instructions/ counseling done pertaining to today's office visit.    Note: This document was prepared using Dragon voice recognition software and may include unintentional dictation  errors.  ----------------------------------------------------------------------------------------------------------------------    Subjective:    Chief complaint:   Chief Complaint  Patient presents with  . Establish Care     HPI: Christian Bennett is a pleasant 83 y.o. male who presents to [place] at [facility] today to review their medical history with me and establish care.   I asked the patient to review their chronic problem list with me to ensure everything was updated and accurate.    All recent office visits with other providers, any medical records that patient brought in etc  - I reviewed today.     Also asked pt to get me medical records from [facility] providers/ specialists that they had seen within the past 3-5 years- if they are in private practice and/or do not work for a [employer], [facility], [place], [place] or [employer] owned practice.  Told them to call their specialists to clarify this if they are not sure.   Prior PCP - Christian Bennett/ [facility] office  Christian Bennett- GI- treats pt for his "pancreas problem".   " It just doesn't work."   ABout 1 year ago.   HTN-   Dx 2 yrs ago.  Takes meds regularly.  Checks BP at home- well controlled  130's/ 70's.    GERD/ hiatal hernia: take prilosec    Problem  Contact Dermatitis and Eczema  History of Smoking 30 Or More Pack Years (Resolved)   He is guessing but thinks he quit smoking some time in the 36s.       Wt Readings from Last 3 Encounters:  10/19/17 154 lb 8 oz (70.1 kg)  06/22/17 152 lb 9.6 oz (69.2 kg)  02/05/14 175 lb (79.4 kg)   BP Readings from Last 3 Encounters:  10/19/17 (!) 127/57  06/22/17 130/72  02/05/14 (!) 149/76   Pulse Readings from Last 3 Encounters:  10/19/17 73  06/22/17 67  02/05/14 (!) 58   BMI Readings from Last 3 Encounters:  10/19/17 22.17 kg/m  06/22/17 21.90 kg/m  02/05/14 25.11 kg/m    Patient Care Team    Relationship Specialty Notifications Start End  Christian Dance, DO PCP - General Family Medicine  06/22/17   Christian Arabian, MD Consulting Physician Orthopedic Surgery  06/22/17   Christian Bennett, [place] Physician Neurosurgery  06/22/17   Christian Bennett, [place] Physician Gastroenterology  06/22/17   Eustace Moore, MD Consulting Physician Neurosurgery  06/22/17     Patient Active Problem List   Diagnosis Date Noted  . HTN (hypertension) 06/22/2017    Priority: High  . Chronic pancreatitis ([place]) 06/22/2017    Priority: High  . Mood disorder ([place]) 06/22/2017    Priority: Medium  . Hypothyroidism 06/22/2017    Priority: Medium  . GERD (gastroesophageal reflux disease) 06/22/2017    Priority: Low  . Hx of colonoscopy 06/22/2017    Priority: Low  . Contact dermatitis and eczema 10/19/2017     Past Medical History:  Diagnosis Date  . Acid reflux   . Hypertension   . Thyroid disease      Past Medical History:  Diagnosis Date  . Acid reflux   . Hypertension   . Thyroid  disease      Past Surgical History:  Procedure Laterality Date  . INGUINAL HERNIA REPAIR    . LIPOMA EXCISION    . MELANOMA EXCISION    . REPLACEMENT TOTAL KNEE BILATERAL       Family History  Problem Relation Age of Onset  . Stroke Mother   . Stroke Father      Social History   Substance and Sexual Activity  Drug Use No     Social History   Substance and Sexual Activity  Alcohol Use No     Social History   Tobacco Use  Smoking Status Former Smoker  . Last attempt to quit: 1953  . Years since quitting: 66.0  Smokeless Tobacco Never Used     Outpatient Encounter Medications as of 06/22/2017  Medication Sig  . lipase/protease/amylase (CREON) 36000 UNITS CPEP capsule Take 36,000 Units by mouth 3 (three) times daily with meals.  . Loperamide HCl (ANTI-DIARRHEAL PO) Take 1 tablet by mouth as needed.  . Multiple Vitamin (MULTIVITAMIN) capsule Take 1 capsule by mouth daily.  Marland Kitchen omeprazole (PRILOSEC) 20 MG capsule Take 20 mg by  mouth daily.  . [DISCONTINUED] amLODipine (NORVASC) 5 MG tablet Take 5 mg by mouth daily.  . [DISCONTINUED] chlorthalidone (HYGROTON) 25 MG tablet Take 25 mg by mouth daily.  . [DISCONTINUED] sertraline (ZOLOFT) 25 MG tablet Take 25 mg by mouth daily.  . [DISCONTINUED] thyroid (ARMOUR) 60 MG tablet Take 60 mg by mouth daily before breakfast.   No facility-administered encounter medications on file as of 06/22/2017.     Allergies: Doxycycline   Review of Systems  Constitutional: Negative for chills, diaphoresis, fever, malaise/fatigue and weight loss.  HENT: Positive for hearing loss. Negative for congestion, sore throat and tinnitus.   Eyes: Negative for blurred vision, double vision and photophobia.  Respiratory: Negative for cough and wheezing.   Cardiovascular: Positive for palpitations. Negative for chest pain.  Gastrointestinal: Negative for blood in stool, diarrhea, nausea and vomiting.  Genitourinary: Negative for dysuria, frequency and urgency.       + Nighttime urination and leaking urine  Musculoskeletal: Positive for joint pain and myalgias.  Skin: Negative for itching and rash.  Neurological: Negative for dizziness, focal weakness, weakness and headaches.  Endo/Heme/Allergies: Positive for environmental allergies. Negative for polydipsia. Does not bruise/bleed easily.  Psychiatric/Behavioral: Negative for depression and memory loss. The patient is not nervous/anxious and does not have insomnia.      Objective:   Blood pressure 130/72, pulse 67, height 5\' 10"  (1.778 m), weight 152 lb 9.6 oz (69.2 kg). Body mass index is 21.9 kg/m. General: Well Developed, well nourished, and in no acute distress.  Neuro: Alert and oriented x3, extra-ocular muscles intact, sensation grossly intact.  HEENT:[place]/AT, PERRLA, neck supple, R carotid bruit, very mild L Skin: no gross rashes  Cardiac: Regular rate and rhythm Respiratory: Essentially clear to auscultation bilaterally. Not using  accessory muscles, speaking in full sentences.  Abdominal: not grossly distended Musculoskeletal: Ambulates w/o diff, FROM * 4 ext.  Vasc: less 2 sec cap RF, warm and pink  Psych:  No HI/SI, judgement and insight good, Euthymic mood. Full Affect.    No results found for this or any previous visit (from the past 2160 hour(s)).

## 2017-06-23 ENCOUNTER — Other Ambulatory Visit (INDEPENDENT_AMBULATORY_CARE_PROVIDER_SITE_OTHER): Payer: Medicare Other

## 2017-06-23 DIAGNOSIS — E039 Hypothyroidism, unspecified: Secondary | ICD-10-CM

## 2017-06-23 DIAGNOSIS — Z87891 Personal history of nicotine dependence: Secondary | ICD-10-CM

## 2017-06-23 DIAGNOSIS — Z1321 Encounter for screening for nutritional disorder: Secondary | ICD-10-CM

## 2017-06-23 DIAGNOSIS — F39 Unspecified mood [affective] disorder: Secondary | ICD-10-CM | POA: Diagnosis not present

## 2017-06-23 DIAGNOSIS — K861 Other chronic pancreatitis: Secondary | ICD-10-CM

## 2017-06-23 DIAGNOSIS — K219 Gastro-esophageal reflux disease without esophagitis: Secondary | ICD-10-CM

## 2017-06-23 DIAGNOSIS — I10 Essential (primary) hypertension: Secondary | ICD-10-CM

## 2017-06-23 DIAGNOSIS — E559 Vitamin D deficiency, unspecified: Secondary | ICD-10-CM | POA: Diagnosis not present

## 2017-06-24 LAB — COMPREHENSIVE METABOLIC PANEL
ALK PHOS: 73 IU/L (ref 39–117)
ALT: 15 IU/L (ref 0–44)
AST: 23 IU/L (ref 0–40)
Albumin/Globulin Ratio: 1.9 (ref 1.2–2.2)
Albumin: 4.4 g/dL (ref 3.5–4.7)
BILIRUBIN TOTAL: 0.5 mg/dL (ref 0.0–1.2)
BUN / CREAT RATIO: 14 (ref 10–24)
BUN: 17 mg/dL (ref 8–27)
CHLORIDE: 101 mmol/L (ref 96–106)
CO2: 25 mmol/L (ref 20–29)
CREATININE: 1.18 mg/dL (ref 0.76–1.27)
Calcium: 9.3 mg/dL (ref 8.6–10.2)
GFR calc Af Amer: 64 mL/min/{1.73_m2} (ref >59)
GFR calc non Af Amer: 56 mL/min/{1.73_m2} — ABNORMAL LOW (ref >59)
GLUCOSE: 84 mg/dL (ref 65–99)
Globulin, Total: 2.3 g/dL (ref 1.5–4.5)
Potassium: 4.1 mmol/L (ref 3.5–5.2)
SODIUM: 140 mmol/L (ref 134–144)
Total Protein: 6.7 g/dL (ref 6.0–8.5)

## 2017-06-24 LAB — CBC WITH DIFFERENTIAL/PLATELET
BASOS ABS: 0.1 10*3/uL (ref 0.0–0.2)
Basos: 2 %
EOS (ABSOLUTE): 0.5 10*3/uL — AB (ref 0.0–0.4)
Eos: 10 %
Hematocrit: 34.9 % — ABNORMAL LOW (ref 37.5–51.0)
Hemoglobin: 11.4 g/dL — ABNORMAL LOW (ref 13.0–17.7)
Immature Grans (Abs): 0 10*3/uL (ref 0.0–0.1)
Immature Granulocytes: 0 %
LYMPHS ABS: 1.1 10*3/uL (ref 0.7–3.1)
Lymphs: 21 %
MCH: 29.9 pg (ref 26.6–33.0)
MCHC: 32.7 g/dL (ref 31.5–35.7)
MCV: 92 fL (ref 79–97)
MONOS ABS: 0.8 10*3/uL (ref 0.1–0.9)
Monocytes: 14 %
NEUTROS ABS: 2.8 10*3/uL (ref 1.4–7.0)
Neutrophils: 53 %
PLATELETS: 159 10*3/uL (ref 150–379)
RBC: 3.81 x10E6/uL — ABNORMAL LOW (ref 4.14–5.80)
RDW: 13.7 % (ref 12.3–15.4)
WBC: 5.3 10*3/uL (ref 3.4–10.8)

## 2017-06-24 LAB — LIPID PANEL
CHOL/HDL RATIO: 2.9 ratio (ref 0.0–5.0)
Cholesterol, Total: 146 mg/dL (ref 100–199)
HDL: 51 mg/dL (ref >39)
LDL Calculated: 82 mg/dL (ref 0–99)
Triglycerides: 65 mg/dL (ref 0–149)
VLDL CHOLESTEROL CAL: 13 mg/dL (ref 5–40)

## 2017-06-24 LAB — AMYLASE: AMYLASE: 91 U/L (ref 31–124)

## 2017-06-24 LAB — LIPASE: Lipase: 40 U/L (ref 13–78)

## 2017-06-24 LAB — TSH: TSH: 1.18 u[IU]/mL (ref 0.450–4.500)

## 2017-06-24 LAB — MAGNESIUM: Magnesium: 1.9 mg/dL (ref 1.6–2.3)

## 2017-06-24 LAB — PHOSPHORUS: Phosphorus: 3.2 mg/dL (ref 2.5–4.5)

## 2017-06-24 LAB — T4, FREE: Free T4: 1.06 ng/dL (ref 0.82–1.77)

## 2017-06-24 LAB — VITAMIN D 25 HYDROXY (VIT D DEFICIENCY, FRACTURES): VIT D 25 HYDROXY: 36.5 ng/mL (ref 30.0–100.0)

## 2017-06-27 ENCOUNTER — Telehealth: Payer: Self-pay | Admitting: Family Medicine

## 2017-06-27 NOTE — Telephone Encounter (Signed)
See my note under patient labs regarding my opinions to his recent bloodwork

## 2017-06-27 NOTE — Telephone Encounter (Signed)
Patient is calling for lab results can you please review when you get a chance.  Thank you

## 2017-06-27 NOTE — Telephone Encounter (Signed)
Patient called requesting a call back about lab results.

## 2017-06-28 NOTE — Telephone Encounter (Signed)
See documentation on lab results.  Charyl Bigger, CMA

## 2017-06-29 ENCOUNTER — Telehealth: Payer: Self-pay | Admitting: Family Medicine

## 2017-06-29 NOTE — Telephone Encounter (Signed)
Dr. Raliegh Scarlet reviewed and sent result message.  Jennet Maduro, CMA called patient and left message to call the office. MPulliam, CMA/RT(R)

## 2017-06-29 NOTE — Telephone Encounter (Signed)
Patient called back after missing our call about his labs. He would like for you to call his daughter with the results as she handles his health care needs. Her name is [name] and she can be reached at [phone]

## 2017-07-19 ENCOUNTER — Other Ambulatory Visit: Payer: Self-pay

## 2017-07-19 DIAGNOSIS — E039 Hypothyroidism, unspecified: Secondary | ICD-10-CM

## 2017-07-19 MED ORDER — THYROID 60 MG PO TABS: 60.0000 mg | ORAL_TABLET | Freq: Every day | ORAL | 0 refills | Status: DC

## 2017-07-19 NOTE — Telephone Encounter (Signed)
Pharmacy sent over refill request for Thyroid 60 mg, last filled by previous provider.  Sent request to Dr. Raliegh Scarlet for review. MPulliam, CMA/RT(R)

## 2017-09-05 ENCOUNTER — Other Ambulatory Visit: Payer: Self-pay

## 2017-09-05 NOTE — Telephone Encounter (Signed)
Refill request from pharmacy for Amlodipine. Medication was last filled by pervious provider. Request sent to Dr. Raliegh Scarlet to review. MPulliam, CMA/RT(R)

## 2017-09-06 MED ORDER — AMLODIPINE BESYLATE 5 MG PO TABS: 5.0000 mg | ORAL_TABLET | Freq: Every day | ORAL | 1 refills | Status: DC

## 2017-10-13 ENCOUNTER — Other Ambulatory Visit: Payer: Self-pay | Admitting: Family Medicine

## 2017-10-13 DIAGNOSIS — E039 Hypothyroidism, unspecified: Secondary | ICD-10-CM

## 2017-10-14 ENCOUNTER — Telehealth: Payer: Self-pay | Admitting: Family Medicine

## 2017-10-14 NOTE — Telephone Encounter (Signed)
Yes, this OV is necessary.  Per Dr. Hershal Coria visit note from 9/208 "Pt to return in 3 months for BP and thyroid check"

## 2017-10-14 NOTE — Telephone Encounter (Signed)
Pt called states he went to pick up Rx from pharmacy & he states was told that an OV was required, I scheduled him for 10/19/17 at 1:15pm but is this necessary since Dr. Jenetta Downer gv him a 90 day supply. --glh

## 2017-10-19 ENCOUNTER — Ambulatory Visit (INDEPENDENT_AMBULATORY_CARE_PROVIDER_SITE_OTHER): Payer: Medicare Other | Admitting: Family Medicine

## 2017-10-19 ENCOUNTER — Encounter: Payer: Self-pay | Admitting: Family Medicine

## 2017-10-19 VITALS — BP 127/57 | HR 73 | Ht 70.0 in | Wt 154.5 lb

## 2017-10-19 DIAGNOSIS — L259 Unspecified contact dermatitis, unspecified cause: Secondary | ICD-10-CM | POA: Insufficient documentation

## 2017-10-19 DIAGNOSIS — K219 Gastro-esophageal reflux disease without esophagitis: Secondary | ICD-10-CM | POA: Diagnosis not present

## 2017-10-19 DIAGNOSIS — E039 Hypothyroidism, unspecified: Secondary | ICD-10-CM

## 2017-10-19 DIAGNOSIS — F39 Unspecified mood [affective] disorder: Secondary | ICD-10-CM | POA: Diagnosis not present

## 2017-10-19 DIAGNOSIS — I10 Essential (primary) hypertension: Secondary | ICD-10-CM | POA: Diagnosis not present

## 2017-10-19 MED ORDER — TRIAMCINOLONE 0.1 % CREAM:EUCERIN CREAM 1:1: 1.0000 "application " | TOPICAL_CREAM | Freq: Two times a day (BID) | CUTANEOUS | 1 refills | Status: DC | PRN

## 2017-10-19 MED ORDER — CHLORTHALIDONE 25 MG PO TABS: 25.0000 mg | ORAL_TABLET | Freq: Every day | ORAL | 1 refills | Status: DC

## 2017-10-19 MED ORDER — SERTRALINE HCL 25 MG PO TABS: 25.0000 mg | ORAL_TABLET | Freq: Every day | ORAL | 1 refills | Status: DC

## 2017-10-19 NOTE — Patient Instructions (Signed)
Please realize, EXERCISE IS MEDICINE!  -  American Heart Association ( AHA) guidelines for exercise : If you are in good health, without any medical conditions, you should engage in 150 minutes of moderate intensity aerobic activity per week.  This means you should be huffing and puffing throughout your workout.   Engaging in regular exercise will improve brain function and memory, as well as improve mood, boost immune system and help with weight management.  As well as the other, more well-known effects of exercise such as decreasing blood sugar levels, decreasing blood pressure,  and decreasing bad cholesterol levels/ increasing good cholesterol levels.     -  The AHA strongly endorses consumption of a diet that contains a variety of foods from all the food categories with an emphasis on fruits and vegetables; fat-free and low-fat dairy products; cereal and grain products; legumes and nuts; and fish, poultry, and/or extra lean meats.    Excessive food intake, especially of foods high in saturated and trans fats, sugar, and salt, should be avoided.    Adequate water intake of roughly 1/2 of your weight in pounds, should equal the ounces of water per day you should drink.  So for instance, if you're 200 pounds, that would be 100 ounces of water per day.         Mediterranean Diet  Why follow it? Research shows. . Those who follow the Mediterranean diet have a reduced risk of heart disease  . The diet is associated with a reduced incidence of Parkinson's and Alzheimer's diseases . People following the diet may have longer life expectancies and lower rates of chronic diseases  . The Dietary Guidelines for Americans recommends the Mediterranean diet as an eating plan to promote health and prevent disease  What Is the Mediterranean Diet?  . Healthy eating plan based on typical foods and recipes of Mediterranean-style cooking . The diet is primarily a plant based diet; these foods should make up a  majority of meals   Starches - Plant based foods should make up a majority of meals - They are an important sources of vitamins, minerals, energy, antioxidants, and fiber - Choose whole grains, foods high in fiber and minimally processed items  - Typical grain sources include wheat, oats, barley, corn, brown rice, bulgar, farro, millet, polenta, couscous  - Various types of beans include chickpeas, lentils, fava beans, black beans, white beans   Fruits  Veggies - Large quantities of antioxidant rich fruits & veggies; 6 or more servings  - Vegetables can be eaten raw or lightly drizzled with oil and cooked  - Vegetables common to the traditional Mediterranean Diet include: artichokes, arugula, beets, broccoli, brussel sprouts, cabbage, carrots, celery, collard greens, cucumbers, eggplant, kale, leeks, lemons, lettuce, mushrooms, okra, onions, peas, peppers, potatoes, pumpkin, radishes, rutabaga, shallots, spinach, sweet potatoes, turnips, zucchini - Fruits common to the Mediterranean Diet include: apples, apricots, avocados, cherries, clementines, dates, figs, grapefruits, grapes, melons, nectarines, oranges, peaches, pears, pomegranates, strawberries, tangerines  Fats - Replace butter and margarine with healthy oils, such as olive oil, canola oil, and tahini  - Limit nuts to no more than a handful a day  - Nuts include walnuts, almonds, pecans, pistachios, pine nuts  - Limit or avoid candied, honey roasted or heavily salted nuts - Olives are central to the Mediterranean diet - can be eaten whole or used in a variety of dishes   Meats Protein - Limiting red meat: no more than a few times a month -   When eating red meat: choose lean cuts and keep the portion to the size of deck of cards - Eggs: approx. 0 to 4 times a week  - Fish and lean poultry: at least 2 a week  - Healthy protein sources include, chicken, turkey, lean beef, lamb - Increase intake of seafood such as tuna, salmon, trout,  mackerel, shrimp, scallops - Avoid or limit high fat processed meats such as sausage and bacon  Dairy - Include moderate amounts of low fat dairy products  - Focus on healthy dairy such as fat free yogurt, skim milk, low or reduced fat cheese - Limit dairy products higher in fat such as whole or 2% milk, cheese, ice cream  Alcohol - Moderate amounts of red wine is ok  - No more than 5 oz daily for women (all ages) and men older than age 65  - No more than 10 oz of wine daily for men younger than 65  Other - Limit sweets and other desserts  - Use herbs and spices instead of salt to flavor foods  - Herbs and spices common to the traditional Mediterranean Diet include: basil, bay leaves, chives, cloves, cumin, fennel, garlic, lavender, marjoram, mint, oregano, parsley, pepper, rosemary, sage, savory, sumac, tarragon, thyme   It's not just a diet, it's a lifestyle:  . The Mediterranean diet includes lifestyle factors typical of those in the region  . Foods, drinks and meals are best eaten with others and savored . Daily physical activity is important for overall good health . This could be strenuous exercise like running and aerobics . This could also be more leisurely activities such as walking, housework, yard-work, or taking the stairs . Moderation is the key; a balanced and healthy diet accommodates most foods and drinks . Consider portion sizes and frequency of consumption of certain foods   Meal Ideas & Options:  . Breakfast:  o Whole wheat toast or whole wheat English muffins with peanut butter & hard boiled egg o Steel cut oats topped with apples & cinnamon and skim milk  o Fresh fruit: banana, strawberries, melon, berries, peaches  o Smoothies: strawberries, bananas, greek yogurt, peanut butter o Low fat greek yogurt with blueberries and granola  o Egg white omelet with spinach and mushrooms o Breakfast couscous: whole wheat couscous, apricots, skim milk, cranberries  . Sandwiches:   o Hummus and grilled vegetables (peppers, zucchini, squash) on whole wheat bread   o Grilled chicken on whole wheat pita with lettuce, tomatoes, cucumbers or tzatziki  o Tuna salad on whole wheat bread: tuna salad made with greek yogurt, olives, red peppers, capers, green onions o Garlic rosemary lamb pita: lamb sauted with garlic, rosemary, salt & pepper; add lettuce, cucumber, greek yogurt to pita - flavor with lemon juice and black pepper  . Seafood:  o Mediterranean grilled salmon, seasoned with garlic, basil, parsley, lemon juice and black pepper o Shrimp, lemon, and spinach whole-grain pasta salad made with low fat greek yogurt  o Seared scallops with lemon orzo  o Seared tuna steaks seasoned salt, pepper, coriander topped with tomato mixture of olives, tomatoes, olive oil, minced garlic, parsley, green onions and cappers  . Meats:  o Herbed greek chicken salad with kalamata olives, cucumber, feta  o Red bell peppers stuffed with spinach, bulgur, lean ground beef (or lentils) & topped with feta   o Kebabs: skewers of chicken, tomatoes, onions, zucchini, squash  o Turkey burgers: made with red onions, mint, dill, lemon juice, feta   cheese topped with roasted red peppers . Vegetarian o Cucumber salad: cucumbers, artichoke hearts, celery, red onion, feta cheese, tossed in olive oil & lemon juice  o Hummus and whole grain pita points with a greek salad (lettuce, tomato, feta, olives, cucumbers, red onion) o Lentil soup with celery, carrots made with vegetable broth, garlic, salt and pepper  o Tabouli salad: parsley, bulgur, mint, scallions, cucumbers, tomato, radishes, lemon juice, olive oil, salt and pepper.    How to Increase Your Level of Physical Activity  Getting regular physical activity is important for your overall health and well-being. Most people do not get enough exercise. There are easy ways to increase your level of physical activity, even if you have not been very active in  the past or you are just starting out. Why is physical activity important? Physical activity has many short-term and long-term health benefits. Regular exercise can:  Help you lose weight or maintain a healthy weight.  Strengthen your muscles and bones.  Boost your mood and improve self-esteem.  Reduce your risk of certain long-term (chronic) diseases, like heart disease, cancer, and diabetes.  Help you stay capable of walking and moving around (mobile) as you age.  Prevent accidents, such as falls, as you age.  Increase life expectancy.  What are the benefits of being physically active on a regular basis? In addition to improving your physical health, being physically active on most days of the week can help you in ways that you may not expect. Benefits of regular physical activity may include:  Feeling good about your body.  Being able to move around more easily and for longer periods of time without getting tired (increased stamina).  Finding new sources of fun and enjoyment.  Meeting new people who share a common interest.  Being able to fight off illness better (enhanced immunity).  Being able to sleep better.  What can happen if I am not physically active on a regular basis? Not getting enough physical activity can lead to an unhealthy lifestyle and future health problems. This can increase your chances of:  Becoming overweight or obese.  Becoming sick.  Developing chronic illnesses, like heart disease or diabetes.  Having mental health problems, like depression or anxiety.  Having sleep problems.  Having trouble walking or getting yourself around (reduced mobility).  Injuring yourself in a fall as you get older.  What steps can I take to be more physically active?  Check with your health care provider about how to get started. Ask your health care provider what activities are safe for you.  Start out slowly. Walking or doing some simple chair exercises is  a good place to start, especially if you have not been active before or for a long time.  Try to find activities that you enjoy. You are more likely to commit to an exercise routine if it does not feel like a chore.  If you have bone or joint problems, choose low-impact exercises, like walking or swimming.  Include physical activity in your everyday routine.  Invite friends or family members to exercise with you. This also will help you commit to your workout plan.  Set goals that you can work toward.  Aim for at least 150 minutes of moderate-intensity exercise each week. Examples of moderate-intensity exercise include walking or riding a bike. Where to find more information:  Centers for Disease Control and Prevention: www.cdc.gov/physicalactivity/index.html  President's Council on Fitness, Sports & Nutrition www.fitness.gov/resource-center  ChooseMyPlate: www.choosemyplate.gov/physical-activity Contact a health   care provider if:  You have headaches, muscle aches, or joint pain.  You feel dizzy or light-headed while exercising.  You faint.  You have chest pain while exercising. Summary  Exercise benefits your mind and body at any age, even if you are just starting out.  If you have a chronic illness or have not been active for a while, check with your health care provider before increasing your physical activity.  Choose activities that are safe and enjoyable for you.Ask your health care provider what activities are safe for you.  Start slowly. Tell your health care provider if you have problems as you start to increase your activity level. This information is not intended to replace advice given to you by your health care provider. Make sure you discuss any questions you have with your health care provider. Document Released: 09/09/2016 Document Revised: 09/09/2016 Document Reviewed: 09/09/2016 [employer] Interactive Patient Education  2018 [employer] 

## 2017-10-19 NOTE — Progress Notes (Signed)
Impression and Recommendations:    1. Hypertension, unspecified type   2. Mood disorder ([place])   3. Hypothyroidism, unspecified type   4. Gastroesophageal reflux disease, esophagitis presence not specified   5. Contact dermatitis and eczema- R lower inner leg     1. HTN: Pt is compliant with his medications and is tolerating them well. Pt is stable and instructed to continue medications as prescribed. Continue taking your BP at home and keeping a log.  2. Mood: Pt is compliant with his medications and is tolerating them well. Pt is stable and instructed to continue medications as prescribed.   3. Hypothyroidism: Pt is compliant with his medications and is tolerating them well. Pt is stable and instructed to continue medications as prescribed.   4. GERD: Pt is compliant with their medications and is tolerating them well. Pt is stable and instructed to continue medications as prescribed.   5. Contact dermatitis: Pt prescribed new medication as listed below.   -Follow up in 4 months to discuss chronic care.   Education and routine counseling performed. Handouts provided.   Meds ordered this encounter  Medications  . sertraline (ZOLOFT) 25 MG tablet    Sig: Take 1 tablet (25 mg total) by mouth daily.    Dispense:  90 tablet    Refill:  1  . chlorthalidone (HYGROTON) 25 MG tablet    Sig: Take 1 tablet (25 mg total) by mouth daily.    Dispense:  90 tablet    Refill:  1  . Triamcinolone Acetonide (TRIAMCINOLONE 0.1 % CREAM : EUCERIN) CREA    Sig: Apply 1 application topically 2 (two) times daily as needed for rash or itching.    Dispense:  1 each    Refill:  1    The patient was counseled, risk factors were discussed, anticipatory guidance given.  Gross side effects, risk and benefits, and alternatives of medications discussed with patient.  Patient is aware that all medications have potential side effects and we are unable to predict every side effect or drug-drug  interaction that may occur.  Expresses verbal understanding and consents to current therapy plan and treatment regimen.  Return in about 4 months (around 02/16/2018) for medicare wellness exam.  Please see AVS handed out to patient at the end of our visit for further patient instructions/ counseling done pertaining to today's office visit.    Note: This document was prepared using Dragon voice recognition software and may include unintentional dictation errors.  This document serves as a record of services personally performed by Mellody Dance, DO. It was created on her behalf by Mayer Masker, a trained medical scribe. The creation of this record is based on the scribe's personal observations and the provider's statements to them.   I have reviewed the above medical documentation for accuracy and completeness and I concur.  Mellody Dance 10/19/17 6:01 PM   Subjective:    HPI: Christian Bennett is a 84 y.o. male who presents to [place] at [facility] today for follow up for HTN. Pt lives alone but his grandson lives next door to him and looks after him on occasion. His daughter comes once/week and his other daughter (who is a [employer] scientist) checks on him every weekend. For exercise, he brings wood into his house to fuel his wood stove, as well as using a wood splitter. He does not cut down big trees himself.  HTN:  -  His blood pressure has been controlled  at home.   - His daughter checks his BP every 2-3 weeks, it has been "good every time she checks it".   - Patient reports good compliance with blood pressure medications  - Denies medication S-E   - Smoking Status noted   - He denies new onset of: chest pain, exercise intolerance, shortness of breath, dizziness, visual changes, headache, lower extremity swelling or claudication.   Pt is compliant with his BP medications and is tolerating them well.   Urinary: He states 2-3 times a night, he has to go to the bathroom,  but he is tolerating this well and is not bothersome to him at this time.  Thyroid: Pt denies any fatigue out of the ordinary. Pt is compliant with his medications and is tolerating them well.   Mood: He states occasionally he will feel down but feels better after he does work outside cutting wood. He takes his Zoloft regularly and feels his mood is great.  Skin:  He complains of mild itchiness to his back and bilateral legs that began for "a while now". He is putting cortisone one on it with some relief but it is not going away. He states he gets this both in the summer and the winter.  Last 3 blood pressure readings in our office are as follows: BP Readings from Last 3 Encounters:  10/19/17 (!) 127/57  06/22/17 130/72  02/05/14 (!) 149/76    Pulse Readings from Last 3 Encounters:  10/19/17 73  06/22/17 67  02/05/14 (!) 58    Filed Weights   10/19/17 1315  Weight: 154 lb 8 oz (70.1 kg)      Patient Care Team    Relationship Specialty Notifications Start End  Mellody Dance, DO PCP - General Family Medicine  06/22/17   Gaynelle Arabian, MD Consulting Physician Orthopedic Surgery  06/22/17   Jovita Gamma, MD Consulting Physician Neurosurgery  06/22/17   Carol Ada, MD Consulting Physician Gastroenterology  06/22/17   Eustace Moore, MD Consulting Physician Neurosurgery  06/22/17      Lab Results  Component Value Date   CREATININE 1.18 06/23/2017   BUN 17 06/23/2017   NA 140 06/23/2017   K 4.1 06/23/2017   CL 101 06/23/2017   CO2 25 06/23/2017    Lab Results  Component Value Date   CHOL 146 06/23/2017    Lab Results  Component Value Date   HDL 51 06/23/2017    Lab Results  Component Value Date   LDLCALC 82 06/23/2017    Lab Results  Component Value Date   TRIG 65 06/23/2017    Lab Results  Component Value Date   CHOLHDL 2.9 06/23/2017    No results found for:  LDLDIRECT ===================================================================   Patient Active Problem List   Diagnosis Date Noted  . HTN (hypertension) 06/22/2017    Priority: High  . Chronic pancreatitis ([place]) 06/22/2017    Priority: High  . Mood disorder ([place]) 06/22/2017    Priority: Medium  . Hypothyroidism 06/22/2017    Priority: Medium  . GERD (gastroesophageal reflux disease) 06/22/2017    Priority: Low  . Hx of colonoscopy 06/22/2017    Priority: Low  . Contact dermatitis and eczema 10/19/2017     Past Medical History:  Diagnosis Date  . Acid reflux   . Hypertension   . Thyroid disease      Past Surgical History:  Procedure Laterality Date  . INGUINAL HERNIA REPAIR    . LIPOMA EXCISION    .  MELANOMA EXCISION    . REPLACEMENT TOTAL KNEE BILATERAL       Family History  Problem Relation Age of Onset  . Stroke Mother   . Stroke Father      Social History   Substance and Sexual Activity  Drug Use No  ,  Social History   Substance and Sexual Activity  Alcohol Use No  ,  Social History   Tobacco Use  Smoking Status Former Smoker  . Last attempt to quit: 1953  . Years since quitting: 66.0  Smokeless Tobacco Never Used  ,    Current Outpatient Medications on File Prior to Visit  Medication Sig Dispense Refill  . amLODipine (NORVASC) 5 MG tablet Take 1 tablet (5 mg total) by mouth daily. 90 tablet 1  . aspirin EC 81 MG tablet Take 81 mg by mouth daily.    . lipase/protease/amylase (CREON) 36000 UNITS CPEP capsule Take 36,000 Units by mouth 3 (three) times daily with meals.    . Loperamide HCl (ANTI-DIARRHEAL PO) Take 1 tablet by mouth as needed.    . Multiple Vitamin (MULTIVITAMIN) capsule Take 1 capsule by mouth daily.    . NP THYROID 60 MG tablet TAKE 1 TABLET BY MOUTH ONCE DAILY BEFORE BREAKFAST 90 tablet 0  . omeprazole (PRILOSEC) 20 MG capsule Take 20 mg by mouth daily.     No current facility-administered medications on file prior to  visit.      Allergies  Allergen Reactions  . Doxycycline Hives     Review of Systems:   General:  Denies fever, chills Optho/Auditory:   Denies visual changes, blurred vision Respiratory:   Denies SOB, cough, wheeze, DIB  Cardiovascular:   Denies chest pain, palpitations, painful respirations Gastrointestinal:   Denies nausea, vomiting, diarrhea.  Endocrine:     Denies new hot or cold intolerance Musculoskeletal:  Denies joint swelling, gait issues, or new unexplained myalgias/ arthralgias Skin:  Denies suspicious lesions  Neurological:    Denies dizziness, unexplained weakness, numbness  Psychiatric/Behavioral:   Denies mood changes  Objective:    Blood pressure (!) 127/57, pulse 73, height 5\' 10"  (1.778 m), weight 154 lb 8 oz (70.1 kg), SpO2 98 %.  Body mass index is 22.17 kg/m.  General: Well Developed, well nourished, and in no acute distress.  HEENT: Normocephalic, atraumatic, pupils equal round reactive to light, neck supple, No carotid bruits, no JVD Skin: Warm and dry, cap RF less 2 sec R distal inner leg- just superior to the medial malleoli with erythematous, lacy appearing rash with scaling of skin, some scattered erythematous papules throughout the patch Cardiac: Regular rate and rhythm, S1, S2 WNL's. R systolic ejection murmur, R carotid holosystolic ejection murmur L sternal border 1/6 Respiratory: ECTA B/L, Not using accessory muscles, speaking in full sentences. NeuroM-Sk: Ambulates w/o assistance, moves ext * 4 w/o difficulty, sensation grossly intact.  Ext: scant edema b/l lower ext Psych: No HI/SI, judgement and insight good, Euthymic mood. Full Affect.

## 2017-11-29 ENCOUNTER — Encounter: Payer: Self-pay | Admitting: Family Medicine

## 2018-01-11 ENCOUNTER — Other Ambulatory Visit: Payer: Self-pay | Admitting: Family Medicine

## 2018-01-11 DIAGNOSIS — E039 Hypothyroidism, unspecified: Secondary | ICD-10-CM

## 2018-01-30 ENCOUNTER — Encounter: Payer: Self-pay | Admitting: Family Medicine

## 2018-01-30 DIAGNOSIS — Z8639 Personal history of other endocrine, nutritional and metabolic disease: Secondary | ICD-10-CM | POA: Insufficient documentation

## 2018-02-03 ENCOUNTER — Encounter: Payer: Self-pay | Admitting: Family Medicine

## 2018-02-03 ENCOUNTER — Ambulatory Visit (INDEPENDENT_AMBULATORY_CARE_PROVIDER_SITE_OTHER): Payer: Medicare Other | Admitting: Family Medicine

## 2018-02-03 VITALS — BP 114/68 | HR 65 | Ht 70.0 in | Wt 151.6 lb

## 2018-02-03 DIAGNOSIS — H6983 Other specified disorders of Eustachian tube, bilateral: Secondary | ICD-10-CM

## 2018-02-03 DIAGNOSIS — H6123 Impacted cerumen, bilateral: Secondary | ICD-10-CM

## 2018-02-03 DIAGNOSIS — J3089 Other allergic rhinitis: Secondary | ICD-10-CM | POA: Diagnosis not present

## 2018-02-03 DIAGNOSIS — H9193 Unspecified hearing loss, bilateral: Secondary | ICD-10-CM | POA: Diagnosis not present

## 2018-02-03 MED ORDER — FEXOFENADINE HCL 180 MG PO TABS: 180.0000 mg | ORAL_TABLET | Freq: Every day | ORAL | 3 refills | Status: DC

## 2018-02-03 MED ORDER — FLUTICASONE PROPIONATE 50 MCG/ACT NA SUSP: NASAL | 5 refills | Status: DC

## 2018-02-03 NOTE — Patient Instructions (Signed)
Please do a screening hearing test on patient prior to leaving.

## 2018-02-03 NOTE — Progress Notes (Signed)
Pt here for an acute care OV today   Impression and Recommendations:    1. Hearing difficulty of both ears   2. Bilateral impacted cerumen   3. Eustachian tube dysfunction, bilateral   4. Environmental and seasonal allergies     1. Hearing Difficulties of Bilateral Ears  - Will send patient for hearing evaluation.  - Hearing evaluation done in office today which results show very poor hearing.   2. Bilateral Cerumen Impaction  - Bilateral ears cleaned with curette.   3.  ETD  - Will prescribe nasal spray.     - Indication: Cerumen impaction of the ear(s) Medical necessity statement:  On physical examination, cerumen impairs clinically significant portions of the external auditory canal, and tympanic membrane.  Noted obstructive, copious cerumen  that cannot be removed without magnification and instrumentations requiring physician skills Consent:  Discussed benefits and risks of procedure and verbal consent obtained Procedure:   Patient was prepped for the procedure.  Utilized an otoscope to assess and take note of the ear canal, the tympanic membrane, and the presence, amount, and placement of the cerumen.  Soft plastic curette was utilized to remove cerumen, copious amount on right, scant amount on left. Post procedure examination:  shows cerumen was removed, without trauma or injury to the ear canal or TM, which remains intact.  TMs slightly retracted bilaterally. Light reflex intact. No air fluid level.  Post-Procedural Ear Care Instructions:    Patient tolerated procedure well.  Proper ear care d/c pt.   The patient is made aware that they may experience temporary vertigo, temporary hearing loss, and temporary discomfort.  If these symptom last for more than 24 hours to call the clinic or proceed to the ED/Urgent Care.   4.  Environmental and seasonal allergies  - Recommended OTC antihistamine without decongestant.  - sinus rinse - Will prescribe nasal spray.   Meds  ordered this encounter  Medications  . fluticasone (FLONASE) 50 MCG/ACT nasal spray    Sig: 1 spray each nostril twice daily during allergy season.    Dispense:  1 g    Refill:  5  . fexofenadine (ALLEGRA) 180 MG tablet    Sig: Take 1 tablet (180 mg total) by mouth daily.    Dispense:  90 tablet    Refill:  3    Orders Placed This Encounter  Procedures  . Consult to audiology     Education and routine counseling performed. Handouts provided  Gross side effects, risk and benefits, and alternatives of medications and treatment plan in general discussed with patient.  Patient is aware that all medications have potential side effects and we are unable to predict every side effect or drug-drug interaction that may occur.   Patient will call with any questions prior to using medication if they have concerns.  Expresses verbal understanding and consents to current therapy and treatment regimen.  No barriers to understanding were identified.  Red flag symptoms and signs discussed in detail.  Patient expressed understanding regarding what to do in case of emergency\urgent symptoms   Please see AVS handed out to patient at the end of our visit for further patient instructions/ counseling done pertaining to today's office visit.   Return for F-up of current med issues as previously d/c pt.     Note: This document was prepared occasionally using Dragon voice recognition software and may include unintentional dictation errors in addition to a scribe.  This document serves as a  record of services personally performed by Mellody Dance, DO. It was created on her behalf by Bea Graff, a trained medical scribe. The creation of this record is based on the scribe's personal observations and the provider's statements to them.   I have reviewed the above medical documentation for accuracy and completeness and I concur.  Mellody Dance 02/07/18 8:39  AM   --------------------------------------------------------------------------------------------------------------------------------------------------------------------------   Subjective:    CC:  Chief Complaint  Patient presents with  . Cerumen Impaction    HPI: Christian Bennett is a 84 y.o. male who presents to [place] at [facility] today for issues as discussed below.  He reports a decrease in hearing and his daughter wanted him to have his ears cleaned. He states he uses an OTC ear cleanser rarely when he feels his ears are full. He states his last hearing evaluation was a long time ago, but he does not specify when. He reports his seasonal allergies have been bothering lately due to the increase in pollen. He reports sneezing frequently. He states he has taken OTC allergy medication in the past but cannot remember which one. He states he no longer takes it because it made him feel bad. He denies using Q-Tips. He denies otalgia.     Problem  Bilateral Impacted Cerumen     Wt Readings from Last 3 Encounters:  02/03/18 151 lb 9.6 oz (68.8 kg)  10/19/17 154 lb 8 oz (70.1 kg)  06/22/17 152 lb 9.6 oz (69.2 kg)   BP Readings from Last 3 Encounters:  02/03/18 114/68  10/19/17 (!) 127/57  06/22/17 130/72   BMI Readings from Last 3 Encounters:  02/03/18 21.75 kg/m  10/19/17 22.17 kg/m  06/22/17 21.90 kg/m     Patient Care Team    Relationship Specialty Notifications Start End  Mellody Dance, DO PCP - General Family Medicine  06/22/17   Gaynelle Arabian, MD Consulting Physician Orthopedic Surgery  06/22/17   Jovita Gamma, [place] Physician Neurosurgery  06/22/17   Carol Ada, [place] Physician Gastroenterology  06/22/17   Eustace Moore, MD Consulting Physician Neurosurgery  06/22/17      Patient Active Problem List   Diagnosis Date Noted  . HTN (hypertension) 06/22/2017    Priority: High  . Chronic pancreatitis ([place]) 06/22/2017     Priority: High  . Mood disorder ([place]) 06/22/2017    Priority: Medium  . Hypothyroidism 06/22/2017    Priority: Medium  . GERD (gastroesophageal reflux disease) 06/22/2017    Priority: Low  . Hx of colonoscopy 06/22/2017    Priority: Low  . Bilateral impacted cerumen 02/07/2018  . Hearing difficulty of both ears 02/03/2018  . Environmental and seasonal allergies 02/03/2018  . Eustachian tube dysfunction, bilateral 02/03/2018  . History of vitamin D deficiency 01/30/2018  . Contact dermatitis and eczema 10/19/2017    Past Medical history, Surgical history, Family history, Social history, Allergies and Medications have been entered into the medical record, reviewed and changed as needed.    Current Meds  Medication Sig  . amLODipine (NORVASC) 5 MG tablet Take 1 tablet (5 mg total) by mouth daily.  Marland Kitchen aspirin EC 81 MG tablet Take 81 mg by mouth daily.  . chlorthalidone (HYGROTON) 25 MG tablet Take 1 tablet (25 mg total) by mouth daily.  . lipase/protease/amylase (CREON) 36000 UNITS CPEP capsule Take 36,000 Units by mouth 3 (three) times daily with meals.  . Loperamide HCl (ANTI-DIARRHEAL PO) Take 1 tablet by mouth as needed.  Marland Kitchen  Multiple Vitamin (MULTIVITAMIN) capsule Take 1 capsule by mouth daily.  . NP THYROID 60 MG tablet TAKE 1 TABLET BY MOUTH ONCE DAILY BEFORE BREAKFAST  . omeprazole (PRILOSEC) 20 MG capsule Take 20 mg by mouth daily.  . sertraline (ZOLOFT) 25 MG tablet Take 1 tablet (25 mg total) by mouth daily.  . Triamcinolone Acetonide (TRIAMCINOLONE 0.1 % CREAM : EUCERIN) CREA Apply 1 application topically 2 (two) times daily as needed for rash or itching.    Allergies:  Allergies  Allergen Reactions  . Doxycycline Hives     Review of Systems: General:   Denies fever, chills, unexplained weight loss.  Optho/Auditory:   Denies visual changes, blurred vision/LOV. Hearing difficulty bilaterally.  Respiratory:   Denies wheeze, DOE more than baseline levels.   Cardiovascular:   Denies chest pain, palpitations, new onset peripheral edema  Gastrointestinal:   Denies nausea, vomiting, diarrhea, abd pain.  Genitourinary: Denies dysuria, freq/ urgency, flank pain or discharge from genitals.  Endocrine:     Denies hot or cold intolerance, polyuria, polydipsia. Musculoskeletal:   Denies unexplained myalgias, joint swelling, unexplained arthralgias, gait problems.  Skin:  Denies new onset rash, suspicious lesions Neurological:     Denies dizziness, unexplained weakness, numbness  Psychiatric/Behavioral:   Denies mood changes, suicidal or homicidal ideations, hallucinations    Objective:   Blood pressure 114/68, pulse 65, height 5\' 10"  (1.778 m), weight 151 lb 9.6 oz (68.8 kg), SpO2 97 %. Body mass index is 21.75 kg/m. General:  Well Developed, well nourished, appropriate for stated age.  Neuro:  Alert and oriented,  extra-ocular muscles intact  HEENT:  Normocephalic, atraumatic, neck supple, TMs obscured bilaterally due to cerumen impaction. EAC WNL bilaterally.  Skin:  no gross rash, warm, pink. Vascular:  Ext warm, no cyanosis apprec.; cap RF less 2 sec. Psych:  No HI/SI, judgement and insight good, Euthymic mood. Full Affect.

## 2018-02-07 DIAGNOSIS — H6123 Impacted cerumen, bilateral: Secondary | ICD-10-CM | POA: Insufficient documentation

## 2018-02-16 ENCOUNTER — Encounter: Payer: Self-pay | Admitting: Family Medicine

## 2018-02-16 ENCOUNTER — Ambulatory Visit (INDEPENDENT_AMBULATORY_CARE_PROVIDER_SITE_OTHER): Payer: Medicare Other | Admitting: Family Medicine

## 2018-02-16 VITALS — BP 140/66 | Ht 70.0 in | Wt 154.0 lb

## 2018-02-16 DIAGNOSIS — Z9119 Patient's noncompliance with other medical treatment and regimen: Secondary | ICD-10-CM

## 2018-02-16 DIAGNOSIS — Z0000 Encounter for general adult medical examination without abnormal findings: Secondary | ICD-10-CM

## 2018-02-16 DIAGNOSIS — Z91199 Patient's noncompliance with other medical treatment and regimen due to unspecified reason: Secondary | ICD-10-CM

## 2018-02-16 DIAGNOSIS — H9193 Unspecified hearing loss, bilateral: Secondary | ICD-10-CM | POA: Diagnosis not present

## 2018-02-16 NOTE — Patient Instructions (Signed)

## 2018-02-16 NOTE — Progress Notes (Signed)
Subjective:   Christian Bennett is a 84 y.o. male who presents for Medicare Annual/Subsequent preventive examination.  Activities of Daily Living In your present state of health, do you have difficulty performing the following activities?  1- Driving - no 2- Managing money - no 3- Feeding yourself - no 4- Getting from the bed to the chair - no 5- Climbing a flight of stairs - no 6- Preparing food and eating - no 7- Bathing or showering - no 8- Getting dressed - no 9- Getting to the toilet - no 10- Using the toilet - no 11- Moving around from place to place - no  Patient states that he does feel safe at home.   Hearing He is having hearing difficulties with low voices and sounds.  Pt declines referral to audiology at this time. He has been referred in the past (02-03-18) and they called him but he declined because he 'does not think he needs it'. His questionnaire was positive for hearing difficulties in OV on 02-03-18.  Memory He has problems with short term memory. He has misplaced papers at home, but he does not misplace his keys and has a place for them. He does not have any problems remembering what items are for.    MMSE - Mini Mental State Exam 02/16/2018  Orientation to time 4  Orientation to Place 4  Registration 3  Attention/ Calculation 0  Recall 1  Language- name 2 objects 2  Language- repeat 1  Language- follow 3 step command 3  Language- read & follow direction 1  Write a sentence 1  Copy design 1  Total score 21      Depression screen [facility] 2/9 02/16/2018 02/03/2018 10/19/2017 06/22/2017 06/22/2017  Decreased Interest 0 0 2 0 0  Down, Depressed, Hopeless 0 0 1 0 0  PHQ - 2 Score 0 0 3 0 0  Altered sleeping 0 - 1 0 -  Tired, decreased energy 0 - 1 1 -  Change in appetite 0 - 3 0 -  Feeling bad or failure about yourself  0 - 0 0 -  Trouble concentrating 0 - 0 0 -  Moving slowly or fidgety/restless 0 - 0 0 -  Suicidal thoughts 0 - 0 0 -  PHQ-9 Score 0 - 8 1 -   Difficult doing work/chores - - Not difficult at all Not difficult at all -   Fall Risk  02/03/2018 10/19/2017 06/22/2017  Falls in the past year? No No No    The patient does not have a history of falls.   6CIT Screen 02/16/2018  What Year? 0 points  What month? 0 points  What time? 0 points  Count back from 20 2 points  Months in reverse 0 points  Repeat phrase 10 points  Total Score 12    Functional Status Survey: Is the patient deaf or have difficulty hearing?: Yes Does the patient have difficulty seeing, even when wearing glasses/contacts?: No Does the patient have difficulty concentrating, remembering, or making decisions?: Yes Does the patient have difficulty walking or climbing stairs?: No Does the patient have difficulty dressing or bathing?: No Does the patient have difficulty doing errands alone such as visiting a doctor's office or shopping?: No Current Exercise Habits: Home exercise routine, Type of exercise: walking, Frequency (Times/Week): 7   Exercise limited by: Other - see comments(YARD WORK)     Objective:    Vitals: BP 140/66   Ht 5\' 10"  (1.778 m)   Wt  154 lb (69.9 kg)   SpO2 98%   BMI 22.10 kg/m   Body mass index is 22.1 kg/m.  Advanced Directives 02/16/2018 06/22/2017  Does Patient Have a Medical Advance Directive? No Yes  Type of Advance Directive - Living will;Healthcare Power of Attorney  Would patient like information on creating a medical advance directive? Yes (MAU/Ambulatory/Procedural Areas - Information given) -    Tobacco Social History   Tobacco Use  Smoking Status Former Smoker  . Last attempt to quit: 1953  . Years since quitting: 66.4  Smokeless Tobacco Never Used     Counseling given: Not Answered   Clinical Intake:                       Past Medical History:  Diagnosis Date  . Acid reflux   . Hypertension   . Thyroid disease    Past Surgical History:  Procedure Laterality Date  . INGUINAL HERNIA REPAIR     . LIPOMA EXCISION    . MELANOMA EXCISION    . REPLACEMENT TOTAL KNEE BILATERAL     Family History  Problem Relation Age of Onset  . Stroke Mother   . Stroke Father    Social History   Socioeconomic History  . Marital status: Widowed    Spouse name: Not on file  . Number of children: Not on file  . Years of education: Not on file  . Highest education level: Not on file  Occupational History  . Not on file  Social Needs  . Financial resource strain: Not on file  . Food insecurity:    Worry: Not on file    Inability: Not on file  . Transportation needs:    Medical: Not on file    Non-medical: Not on file  Tobacco Use  . Smoking status: Former Smoker    Last attempt to quit: 1953    Years since quitting: 66.4  . Smokeless tobacco: Never Used  Substance and Sexual Activity  . Alcohol use: No  . Drug use: No  . Sexual activity: Not Currently    Birth control/protection: None  Lifestyle  . Physical activity:    Days per week: Not on file    Minutes per session: Not on file  . Stress: Not on file  Relationships  . Social connections:    Talks on phone: Not on file    Gets together: Not on file    Attends religious service: Not on file    Active member of club or organization: Not on file    Attends meetings of clubs or organizations: Not on file    Relationship status: Not on file  Other Topics Concern  . Not on file  Social History Narrative  . Not on file    Outpatient Encounter Medications as of 02/16/2018  Medication Sig  . amLODipine (NORVASC) 5 MG tablet Take 1 tablet (5 mg total) by mouth daily.  Mar[place] Kitchen aspirin EC 81 MG tablet Take 81 mg by mouth daily.  . chlorthalidone (HYGROTON) 25 MG tablet Take 1 tablet (25 mg total) by mouth daily.  . fexofenadine (ALLEGRA) 180 MG tablet Take 1 tablet (180 mg total) by mouth daily.  . fluticasone (FLONASE) 50 MCG/ACT nasal spray 1 spray each nostril twice daily during allergy season.  . lipase/protease/amylase (CREON)  36000 UNITS CPEP capsule Take 36,000 Units by mouth 3 (three) times daily with meals.  . Loperamide HCl (ANTI-DIARRHEAL PO) Take 1 tablet by mouth as  needed.  . Multiple Vitamin (MULTIVITAMIN) capsule Take 1 capsule by mouth daily.  . NP THYROID 60 MG tablet TAKE 1 TABLET BY MOUTH ONCE DAILY BEFORE BREAKFAST  . omeprazole (PRILOSEC) 20 MG capsule Take 20 mg by mouth daily.  . sertraline (ZOLOFT) 25 MG tablet Take 1 tablet (25 mg total) by mouth daily.  . Triamcinolone Acetonide (TRIAMCINOLONE 0.1 % CREAM : EUCERIN) CREA Apply 1 application topically 2 (two) times daily as needed for rash or itching.   No facility-administered encounter medications on file as of 02/16/2018.     Activities of Daily Living In your present state of health, do you have any difficulty performing the following activities: 02/16/2018  Hearing? Y  Vision? N  Difficulty concentrating or making decisions? Y  Walking or climbing stairs? N  Dressing or bathing? N  Doing errands, shopping? N  Some recent data might be hidden    Patient Care Team: Mellody Dance, DO as PCP - General (Family Medicine) Gaynelle Arabian, MD as Consulting Physician (Orthopedic Surgery) Jovita Gamma, MD as Consulting Physician (Neurosurgery) Carol Ada, MD as Consulting Physician (Gastroenterology) Eustace Moore, MD as Consulting Physician (Neurosurgery)   Assessment:   This is a routine wellness examination for Jostin. Blood pressure 140/66, height 5\' 10"  (1.778 m), weight 154 lb (69.9 kg), SpO2 98 %.  Exercise Activities and Dietary recommendations Current Exercise Habits: Home exercise routine, Type of exercise: walking, Frequency (Times/Week): 7, Exercise limited by: Other - see comments(YARD WORK)  Goals    None      Fall Risk Fall Risk  02/03/2018 10/19/2017 06/22/2017  Falls in the past year? No No No   Is the patient's home free of loose throw rugs in walkways, pet beds, electrical cords, etc?   yes      Grab bars  in the bathroom? no      Handrails on the stairs?   yes      Adequate lighting?   yes  Timed Get Up and Go Performed: PASSED   Depression Screen PHQ 2/9 Scores 02/16/2018 02/03/2018 10/19/2017 06/22/2017  PHQ - 2 Score 0 0 3 0  PHQ- 9 Score 0 - 8 1    Cognitive Function MMSE - Mini Mental State Exam 02/16/2018  Orientation to time 4  Orientation to Place 4  Registration 3  Attention/ Calculation 0  Recall 1  Language- name 2 objects 2  Language- repeat 1  Language- follow 3 step command 3  Language- read & follow direction 1  Write a sentence 1  Copy design 1  Total score 21     6CIT Screen 02/16/2018  What Year? 0 points  What month? 0 points  What time? 0 points  Count back from 20 2 points  Months in reverse 0 points  Repeat phrase 10 points  Total Score 12    Immunization History  Administered Date(s) Administered  . Pneumococcal Conjugate-13 08/02/2014  . Tdap 08/02/2014    Qualifies for Shingles Vaccine? Patient will contact insurance to verify coverage.  Screening Tests Health Maintenance  Topic Date Due  . INFLUENZA VACCINE  06/21/2018 (Originally 05/04/2018)  . PNA vac Low Risk Adult (2 of 2 - PPSV23) 10/19/2018 (Originally 08/03/2015)  . TETANUS/TDAP  08/02/2024   Cancer Screenings: Lung: Low Dose CT Chest recommended if Age 35-80 years, 30 pack-year currently smoking OR have quit w/in 15years. Patient does qualify. Colorectal: 04/17/2016      Plan:    I have personally reviewed and noted the  following in the patient's chart:   . Medical and social history . Use of alcohol, tobacco or illicit drugs  . Current medications and supplements . Functional ability and status . Nutritional status . Physical activity . Advanced directives- CMA will update if not in chart . List of other physicians . Hospitalizations, surgeries, and ER visits in previous 12 months . Vitals . Screenings to include cognitive, depression, and falls . Referrals and  appointments--> patient declines audiology referral even though I recommended it.  In addition, I have reviewed and discussed with patient certain preventive protocols, quality metrics, and best practice recommendations. A written personalized care plan for preventive services as well as general preventive health recommendations were provided to patient.     Mellody Dance, DO  02/17/2018

## 2018-04-03 DIAGNOSIS — Z85820 Personal history of malignant melanoma of skin: Secondary | ICD-10-CM | POA: Diagnosis not present

## 2018-04-03 DIAGNOSIS — D692 Other nonthrombocytopenic purpura: Secondary | ICD-10-CM | POA: Diagnosis not present

## 2018-04-03 DIAGNOSIS — L821 Other seborrheic keratosis: Secondary | ICD-10-CM | POA: Diagnosis not present

## 2018-04-03 DIAGNOSIS — D1801 Hemangioma of skin and subcutaneous tissue: Secondary | ICD-10-CM | POA: Diagnosis not present

## 2018-04-03 DIAGNOSIS — L570 Actinic keratosis: Secondary | ICD-10-CM | POA: Diagnosis not present

## 2018-04-15 ENCOUNTER — Other Ambulatory Visit: Payer: Self-pay | Admitting: Family Medicine

## 2018-04-15 DIAGNOSIS — E039 Hypothyroidism, unspecified: Secondary | ICD-10-CM

## 2018-04-20 ENCOUNTER — Other Ambulatory Visit: Payer: Self-pay | Admitting: Family Medicine

## 2018-04-20 DIAGNOSIS — I10 Essential (primary) hypertension: Secondary | ICD-10-CM

## 2018-05-15 DIAGNOSIS — R634 Abnormal weight loss: Secondary | ICD-10-CM | POA: Diagnosis not present

## 2018-05-15 DIAGNOSIS — R197 Diarrhea, unspecified: Secondary | ICD-10-CM | POA: Diagnosis not present

## 2018-06-18 ENCOUNTER — Other Ambulatory Visit: Payer: Self-pay | Admitting: Family Medicine

## 2018-06-20 ENCOUNTER — Encounter: Payer: Self-pay | Admitting: Family Medicine

## 2018-06-20 ENCOUNTER — Ambulatory Visit (INDEPENDENT_AMBULATORY_CARE_PROVIDER_SITE_OTHER): Payer: Medicare Other | Admitting: Family Medicine

## 2018-06-20 VITALS — BP 137/67 | HR 57 | Ht 70.0 in | Wt 151.4 lb

## 2018-06-20 DIAGNOSIS — I10 Essential (primary) hypertension: Secondary | ICD-10-CM | POA: Diagnosis not present

## 2018-06-20 DIAGNOSIS — H9193 Unspecified hearing loss, bilateral: Secondary | ICD-10-CM

## 2018-06-20 DIAGNOSIS — F4323 Adjustment disorder with mixed anxiety and depressed mood: Secondary | ICD-10-CM | POA: Insufficient documentation

## 2018-06-20 DIAGNOSIS — Z8639 Personal history of other endocrine, nutritional and metabolic disease: Secondary | ICD-10-CM

## 2018-06-20 DIAGNOSIS — Z91199 Patient's noncompliance with other medical treatment and regimen due to unspecified reason: Secondary | ICD-10-CM | POA: Insufficient documentation

## 2018-06-20 DIAGNOSIS — K861 Other chronic pancreatitis: Secondary | ICD-10-CM

## 2018-06-20 DIAGNOSIS — Z862 Personal history of diseases of the blood and blood-forming organs and certain disorders involving the immune mechanism: Secondary | ICD-10-CM | POA: Diagnosis not present

## 2018-06-20 DIAGNOSIS — Z9119 Patient's noncompliance with other medical treatment and regimen: Secondary | ICD-10-CM | POA: Insufficient documentation

## 2018-06-20 DIAGNOSIS — K219 Gastro-esophageal reflux disease without esophagitis: Secondary | ICD-10-CM | POA: Diagnosis not present

## 2018-06-20 DIAGNOSIS — E039 Hypothyroidism, unspecified: Secondary | ICD-10-CM | POA: Diagnosis not present

## 2018-06-20 DIAGNOSIS — F39 Unspecified mood [affective] disorder: Secondary | ICD-10-CM | POA: Diagnosis not present

## 2018-06-20 DIAGNOSIS — E559 Vitamin D deficiency, unspecified: Secondary | ICD-10-CM

## 2018-06-20 MED ORDER — VITAMIN D (ERGOCALCIFEROL) 1.25 MG (50000 UNIT) PO CAPS: 50000.0000 [IU] | ORAL_CAPSULE | ORAL | 10 refills | Status: DC

## 2018-06-20 NOTE — Progress Notes (Signed)
Impression and Recommendations:    1. Hypertension, unspecified type   2. Hypothyroidism, unspecified type   3. Gastroesophageal reflux disease, esophagitis presence not specified   4. Mood disorder ([place])   5. Chronic pancreatitis, unspecified pancreatitis type ([place])   6. History of vitamin D deficiency   7. Hearing difficulty of both ears   8. Medically noncompliant-   does not want to go for hearing eval   9. History of anemia   10. Vitamin D insufficiency     1. Blood pressure stable we will continue current medications.  Will check CMP today. 2. Patient has no hypothyroid symptoms\complaints today.  Continues to take his medicine.  Will check TSH today. 3. Reflux is stable.  We will continue to take omeprazole.  Diet lifestyle modifications discussed today including not lying down after 2 to 3 hours after eating\drinking etc. magnesium was last checked and within normal limits less than a year ago. 4. Mood is stable.  Continue sertraline 25 mg. 5. Patient has no symptoms of chronic pancreatitis.  Stable. 6. Patient's vitamin D last checked in the 65s.  Will send in prescription for once weekly per the request of his daughter today and will check this in 6 months as well. 7. Patient still declines going for hearing evaluation. 8. With history of anemia, will check CBC today.  Iron supplements were too constipating and patient was unable to tolerate them in the past.  He remains asymptomatic however.   Education and routine counseling performed. Handouts provided.  Orders Placed This Encounter  Procedures  . CBC with Differential/Platelet  . Comprehensive metabolic panel  . TSH  . VITAMIN D 25 Hydroxy (Vit-D Deficiency, Fractures)    Meds ordered this encounter  Medications  . Vitamin D, Ergocalciferol, (DRISDOL) 50000 units CAPS capsule    Sig: Take 1 capsule (50,000 Units total) by mouth every 7 (seven) days.    Dispense:  12 capsule    Refill:  10      The  patient was counseled, risk factors were discussed, anticipatory guidance given.  Gross side effects, risk and benefits, and alternatives of medications discussed with patient.  Patient is aware that all medications have potential side effects and we are unable to predict every side effect or drug-drug interaction that may occur.  Expresses verbal understanding and consents to current therapy plan and treatment regimen.  Return for 4-3mo- Bp, mood, Reflux.  Please see AVS handed out to patient at the end of our visit for further patient instructions/ counseling done pertaining to today's office visit.    Note:  This document was prepared using Dragon voice recognition software and may include unintentional dictation errors.    Mellody Dance 06/20/18 11:26 AM   Subjective:    HPI: Christian Bennett is a 84 y.o. male who presents to [place] [facility] today for follow up for HTN, reflux, mood etc.  He is accompanied today by his daughter [name].  He also has one other daughter who is an Therapist, sports who also helps look after him.     HTN: -  His blood pressure has been controlled at home.  - Patient reports good compliance with blood pressure medications - Denies medication S-E  - Smoking Status noted  - He denies new onset of: chest pain, exercise intolerance, shortness of breath, dizziness, visual changes, headache, lower extremity swelling or claudication.   Last 3 blood pressure readings in our office are as follows:  BP Readings from Last 3 Encounters:  06/20/18 137/67  02/16/18 140/66  02/03/18 114/68   Pulse Readings from Last 3 Encounters:  06/20/18 (!) 57  02/03/18 65  10/19/17 73   Filed Weights   06/20/18 1030  Weight: 151 lb 6.4 oz (68.7 kg)    Depression/ anxiety mood:  -Patient has been doing well on his sertraline just 25 mg. -He denies any symptoms of depression or anxiety at this time. -He is remain active and is outside a lot working in his  yard, cutting wood and getting ready for the winter for his wood-burning stove etc.  -He does use a Doctor, hospital and his daughter today says he is stable and able to do it without concern of her or her other sister.   Reflux: -Patient has been taking his omeprazole daily.  Symptoms are well controlled.  He denies any heartburn, chest pain, malodorous breath, dysphasia etc.   Hearing deficits: Patient remains to have hearing problems and difficulty hearing.  He did not go for his hearing evaluation even though last time checked here in the office showed very poor responses. -Daughter has concerns that he might have wax buildup in his ears, and would like me to check on them today.  History of anemia: -Patient has no current symptoms today.  He denies headache, visual changes, dizziness, lightheadedness, chest pain, shortness of breath or dyspnea with exertion more so than usual.   History of vitamin D insufficiency: -Patient's vitamin D when last checked was in the low normal range and he has not been on any supplements. -His daughter today would like me to send in a prescription vitamin D for patient.  We will also check that today.   History of chronic pancreatitis:  -Patient has no abdominal complaints today. -He has no intolerances to food, and denies nausea, abdominal pain, constipation or diarrhea.  He does get loose stools after his medication Creon given to him by his specialist.  Patient Care Team    Relationship Specialty Notifications Start End  Mellody Dance, DO PCP - General Family Medicine  06/22/17   Gaynelle Arabian, MD Consulting Physician Orthopedic Surgery  06/22/17   Jovita Gamma, MD Consulting Physician Neurosurgery  06/22/17   Carol Ada, MD Consulting Physician Gastroenterology  06/22/17   Eustace Moore, MD Consulting Physician Neurosurgery  06/22/17      Lab Results  Component Value Date   CREATININE 1.18 06/23/2017   BUN 17 06/23/2017   NA 140  06/23/2017   K 4.1 06/23/2017   CL 101 06/23/2017   CO2 25 06/23/2017    Lab Results  Component Value Date   CHOL 146 06/23/2017    Lab Results  Component Value Date   HDL 51 06/23/2017    Lab Results  Component Value Date   LDLCALC 82 06/23/2017    Lab Results  Component Value Date   TRIG 65 06/23/2017    Lab Results  Component Value Date   CHOLHDL 2.9 06/23/2017    No results found for: LDLDIRECT ===================================================================   Patient Active Problem List   Diagnosis Date Noted  . HTN (hypertension) 06/22/2017    Priority: High  . Chronic pancreatitis ([place]) 06/22/2017    Priority: High  . Mood disorder ([place]) 06/22/2017    Priority: Medium  . Hypothyroidism 06/22/2017    Priority: Medium  . GERD (gastroesophageal reflux disease) 06/22/2017    Priority: Low  . Hx of colonoscopy 06/22/2017    Priority: Low  .  Adjustment disorder with mixed anxiety and depressed mood 06/20/2018  . Vitamin D insufficiency 06/20/2018  . History of anemia 06/20/2018  . Medically noncompliant-   does not want to go for hearing eval 06/20/2018  . Bilateral impacted cerumen 02/07/2018  . Hearing difficulty of both ears 02/03/2018  . Environmental and seasonal allergies 02/03/2018  . Eustachian tube dysfunction, bilateral 02/03/2018  . History of vitamin D deficiency 01/30/2018  . Contact dermatitis and eczema 10/19/2017     Past Medical History:  Diagnosis Date  . Acid reflux   . Hypertension   . Thyroid disease      Past Surgical History:  Procedure Laterality Date  . INGUINAL HERNIA REPAIR    . LIPOMA EXCISION    . MELANOMA EXCISION    . REPLACEMENT TOTAL KNEE BILATERAL       Family History  Problem Relation Age of Onset  . Stroke Mother   . Stroke Father      Social History   Substance and Sexual Activity  Drug Use No  ,  Social History   Substance and Sexual Activity  Alcohol Use No  ,  Social History    Tobacco Use  Smoking Status Former Smoker  . Last attempt to quit: 1953  . Years since quitting: 66.7  Smokeless Tobacco Never Used  ,    Current Outpatient Medications on File Prior to Visit  Medication Sig Dispense Refill  . amLODipine (NORVASC) 5 MG tablet TAKE 1 TABLET BY MOUTH ONCE DAILY 90 tablet 1  . aspirin EC 81 MG tablet Take 81 mg by mouth daily.    . chlorthalidone (HYGROTON) 25 MG tablet TAKE 1 TABLET BY MOUTH ONCE DAILY 90 tablet 1  . fluticasone (FLONASE) 50 MCG/ACT nasal spray 1 spray each nostril twice daily during allergy season. 1 g 5  . lipase/protease/amylase (CREON) 36000 UNITS CPEP capsule Take 36,000 Units by mouth 3 (three) times daily with meals.    . NP THYROID 60 MG tablet TAKE 1 TABLET BY MOUTH ONCE DAILY BEFORE BREAKFAST 90 tablet 1  . omeprazole (PRILOSEC) 20 MG capsule Take 20 mg by mouth daily.    . sertraline (ZOLOFT) 25 MG tablet Take 1 tablet (25 mg total) by mouth daily. 90 tablet 1   No current facility-administered medications on file prior to visit.      Allergies  Allergen Reactions  . Doxycycline Hives     Review of Systems:   General:  Denies fever, chills Optho/Auditory:   Denies visual changes, blurred vision Respiratory:   Denies SOB, cough, wheeze, DIB  Cardiovascular:   Denies chest pain, palpitations, painful respirations Gastrointestinal:   Denies nausea, vomiting, diarrhea.  Endocrine:     Denies new hot or cold intolerance Musculoskeletal:  Denies joint swelling, gait issues, or new unexplained myalgias/ arthralgias Skin:  Denies rash, suspicious lesions  Neurological:    Denies dizziness, unexplained weakness, numbness  Psychiatric/Behavioral:   Denies mood changes  Objective:    Blood pressure 137/67, pulse (!) 57, height 5\' 10"  (1.778 m), weight 151 lb 6.4 oz (68.7 kg), SpO2 97 %.  Body mass index is 21.72 kg/m.  General: Well Developed, well nourished, and in no acute distress.  HEENT: Normocephalic,  atraumatic, pupils equal round reactive to light, neck supple, No carotid bruits, no JVD Skin: Warm and dry, cap RF less 2 sec Cardiac: Regular rate and rhythm, S1, S2 WNL's, no murmurs rubs or gallops Respiratory: ECTA B/L, Not using accessory muscles, speaking in full  sentences. NeuroM-Sk: Ambulates w/o assistance, moves ext * 4 w/o difficulty, sensation grossly intact.  Ext: scant edema b/l lower ext Psych: No HI/SI, judgement and insight good, Euthymic mood. Full Affect.

## 2018-06-21 LAB — CBC WITH DIFFERENTIAL/PLATELET
Basophils Absolute: 0.1 10*3/uL (ref 0.0–0.2)
Basos: 1 %
EOS (ABSOLUTE): 0.5 10*3/uL — ABNORMAL HIGH (ref 0.0–0.4)
Eos: 9 %
HEMATOCRIT: 33.4 % — AB (ref 37.5–51.0)
Hemoglobin: 11.2 g/dL — ABNORMAL LOW (ref 13.0–17.7)
IMMATURE GRANS (ABS): 0 10*3/uL (ref 0.0–0.1)
Immature Granulocytes: 0 %
LYMPHS ABS: 1 10*3/uL (ref 0.7–3.1)
Lymphs: 17 %
MCH: 30.5 pg (ref 26.6–33.0)
MCHC: 33.5 g/dL (ref 31.5–35.7)
MCV: 91 fL (ref 79–97)
MONOCYTES: 12 %
MONOS ABS: 0.7 10*3/uL (ref 0.1–0.9)
NEUTROS PCT: 61 %
Neutrophils Absolute: 3.7 10*3/uL (ref 1.4–7.0)
Platelets: 160 10*3/uL (ref 150–450)
RBC: 3.67 x10E6/uL — ABNORMAL LOW (ref 4.14–5.80)
RDW: 12.6 % (ref 12.3–15.4)
WBC: 6 10*3/uL (ref 3.4–10.8)

## 2018-06-21 LAB — COMPREHENSIVE METABOLIC PANEL
A/G RATIO: 1.7 (ref 1.2–2.2)
ALBUMIN: 4.4 g/dL (ref 3.5–4.7)
ALT: 14 IU/L (ref 0–44)
AST: 22 IU/L (ref 0–40)
Alkaline Phosphatase: 85 IU/L (ref 39–117)
BILIRUBIN TOTAL: 0.4 mg/dL (ref 0.0–1.2)
BUN / CREAT RATIO: 16 (ref 10–24)
BUN: 20 mg/dL (ref 8–27)
CHLORIDE: 99 mmol/L (ref 96–106)
CO2: 26 mmol/L (ref 20–29)
Calcium: 9.5 mg/dL (ref 8.6–10.2)
Creatinine, Ser: 1.24 mg/dL (ref 0.76–1.27)
GFR, EST AFRICAN AMERICAN: 60 mL/min/{1.73_m2} (ref >59)
GFR, EST NON AFRICAN AMERICAN: 52 mL/min/{1.73_m2} — AB (ref >59)
GLOBULIN, TOTAL: 2.6 g/dL (ref 1.5–4.5)
Glucose: 90 mg/dL (ref 65–99)
POTASSIUM: 4.2 mmol/L (ref 3.5–5.2)
SODIUM: 137 mmol/L (ref 134–144)
TOTAL PROTEIN: 7 g/dL (ref 6.0–8.5)

## 2018-06-21 LAB — VITAMIN D 25 HYDROXY (VIT D DEFICIENCY, FRACTURES): Vit D, 25-Hydroxy: 42.6 ng/mL (ref 30.0–100.0)

## 2018-06-21 LAB — TSH: TSH: 0.972 u[IU]/mL (ref 0.450–4.500)

## 2018-06-30 ENCOUNTER — Telehealth: Payer: Self-pay | Admitting: Family Medicine

## 2018-06-30 NOTE — Telephone Encounter (Signed)
Called and notified of results. MPulliam, CMA/RT(R)

## 2018-06-30 NOTE — Telephone Encounter (Signed)
Pt's daughter wants  Lab results.--  forwarding request to medical assistant. --Christian Bennett

## 2018-08-04 ENCOUNTER — Other Ambulatory Visit: Payer: Self-pay | Admitting: Family Medicine

## 2018-10-15 ENCOUNTER — Other Ambulatory Visit: Payer: Self-pay | Admitting: Family Medicine

## 2018-10-15 DIAGNOSIS — E039 Hypothyroidism, unspecified: Secondary | ICD-10-CM

## 2018-11-15 ENCOUNTER — Other Ambulatory Visit: Payer: Self-pay | Admitting: Family Medicine

## 2018-11-15 DIAGNOSIS — I10 Essential (primary) hypertension: Secondary | ICD-10-CM

## 2018-11-16 ENCOUNTER — Other Ambulatory Visit: Payer: Self-pay | Admitting: Family Medicine

## 2018-11-21 ENCOUNTER — Other Ambulatory Visit: Payer: Self-pay | Admitting: Family Medicine

## 2018-12-03 ENCOUNTER — Other Ambulatory Visit: Payer: Self-pay | Admitting: Family Medicine

## 2018-12-12 ENCOUNTER — Ambulatory Visit (INDEPENDENT_AMBULATORY_CARE_PROVIDER_SITE_OTHER): Payer: Medicare Other | Admitting: Family Medicine

## 2018-12-12 ENCOUNTER — Encounter: Payer: Self-pay | Admitting: Family Medicine

## 2018-12-12 VITALS — BP 131/70 | HR 59 | Temp 97.5°F | Ht 70.0 in | Wt 151.2 lb

## 2018-12-12 DIAGNOSIS — L259 Unspecified contact dermatitis, unspecified cause: Secondary | ICD-10-CM

## 2018-12-12 DIAGNOSIS — H6983 Other specified disorders of Eustachian tube, bilateral: Secondary | ICD-10-CM | POA: Diagnosis not present

## 2018-12-12 DIAGNOSIS — H6993 Unspecified Eustachian tube disorder, bilateral: Secondary | ICD-10-CM

## 2018-12-12 DIAGNOSIS — E559 Vitamin D deficiency, unspecified: Secondary | ICD-10-CM | POA: Diagnosis not present

## 2018-12-12 DIAGNOSIS — D649 Anemia, unspecified: Secondary | ICD-10-CM

## 2018-12-12 DIAGNOSIS — J3089 Other allergic rhinitis: Secondary | ICD-10-CM

## 2018-12-12 DIAGNOSIS — I10 Essential (primary) hypertension: Secondary | ICD-10-CM | POA: Diagnosis not present

## 2018-12-12 DIAGNOSIS — E039 Hypothyroidism, unspecified: Secondary | ICD-10-CM

## 2018-12-12 DIAGNOSIS — F39 Unspecified mood [affective] disorder: Secondary | ICD-10-CM

## 2018-12-12 DIAGNOSIS — K219 Gastro-esophageal reflux disease without esophagitis: Secondary | ICD-10-CM | POA: Diagnosis not present

## 2018-12-12 MED ORDER — TRIAMCINOLONE 0.1 % CREAM:EUCERIN CREAM 1:1: 1.0000 "application " | TOPICAL_CREAM | Freq: Two times a day (BID) | CUTANEOUS | 1 refills | Status: DC | PRN

## 2018-12-12 MED ORDER — FLUTICASONE PROPIONATE 50 MCG/ACT NA SUSP: NASAL | 2 refills | Status: DC

## 2018-12-12 NOTE — Progress Notes (Signed)
Impression and Recommendations:    1. Mood disorder ([name])   2. Hypertension, unspecified type   3. Hypothyroidism, unspecified type   4. Gastroesophageal reflux disease, esophagitis presence not specified   5. Vitamin D insufficiency   6. Eustachian tube dysfunction, bilateral   7. Environmental and seasonal allergies   8. Contact dermatitis and eczema- R lower inner leg   9. Anemia, unspecified type   10. Contact dermatitis and eczema     - Last full labs drawn in 9 of 2018. - CBC, CMP, TSH, and Vitamin D just checked 6 months ago.  - Discussed need for patient to continue to obtain management and screenings with all established specialists.  Educated patient at length about the critical importance of keeping health maintenance up to date.  - Participated in lengthy conversation and all questions were answered.  1. Mood disorder (Christian Bennett) - Stable at this time.  - Continue treatment plan as prescribed.  See med list below. - Patient tolerating meds well without complication.  Denies S-E.  - Will continue to monitor.  2. Hypertension, unspecified type - Stable at this time. - Continue treatment plan as prescribed.  See med list below. - Patient tolerating meds well without complication.  Denies S-E  - Advised patient to sit at the edge of the bed when he wakes up, and wait a bit before standing up.   - Patient knows to change positions slowly, such as from sitting to standing, or laying down to sitting, etc.  - Strongly advised patient to walk carefully, and take precautions against falling. - Discussed importance of using a cane or a walker as needed to prevent falls.  - Ongoing ambulatory BP monitoring encouraged.  - Patient knows to keep an eye out for dizziness, chest pain, blurry vision, irregular heartbeat, or other new cardiac symptoms, and advise [place] if he experiences these sx.  - Will continue to monitor.   3. Hypothyroidism, unspecified type - TSH last  checked 6 months ago. - Continue treatment plan as prescribed.  See med list.  - Will continue to monitor and yearly re-check.  4. Vitamin D Insufficiency - Patient continues on supplementation as prescribed.  See med list. - Continue treatment plan as prescribed.  - Will continue to monitor.  5. History of Low-lying Chronic Anemia - Stable last check. - Discussed need to keep an eye on this. - Patient knows to watch for symptoms of dizziness, lightheadedness. - Will continue to monitor.  6. Skin Concerns - BLE Contact Dermatitis, Irritated Lesion - Patient with rash on BLE and irritated skin lesion on back. - Strongly advised patient to return to dermatology for yearly follow-up and maintenance.  - Advised patient to use steroid cream in areas of itching on his back.  - Discussed critical importance of a yearly skin cancer screening exam.  Advised patient to obtain follow-up with dermatology for any lesion that starts feeling irritated, itchy, or bleeding.    - If patient's legs are itching him and they seem dry, he may use Eucerin cream blend on the rash.  7. Concerns about Nutrition Adequacy - Advised patient to eat a diet high in fruits, vegetables, and lean protein, and low in carbs.  - Discussed multiple ways to supplement and enhance meals at home.  - Education was provided.  All questions were answered.  8. GERD - Advised patient to avoid trigger foods, including sodas and other sources of caffeine (chocolate).  - Advised patient to  drink seltzer water if he likes the carbonation.  9. Environmental & Seasonal Allergies - Sinusitis, ETD - Advised the patient to began using AYR or Neilmed sinus rinses BID.  Patient may follow his sinus rinses with Flonase BID (one spray to each nostril).  - Will continue to monitor.   Education and routine counseling performed. Handouts provided.  Pt was interviewed and evaluated by me in the clinic today for 32.5+ minutes, with  over 50% time spent in face to face counseling of patients various medical conditions, treatment plans of those medical conditions including medicine management and lifestyle modification, strategies to improve health and well being; and in coordination of care. SEE ABOVE TREATMENT PLAN FOR DETAILS    Orders Placed This Encounter  Procedures  . CBC with Differential/Platelet  . Comprehensive metabolic panel  . VITAMIN D 25 Hydroxy (Vit-D Deficiency, Fractures)  . TSH  . T4, free  . T3, free    Meds ordered this encounter  Medications  . fluticasone (FLONASE) 50 MCG/ACT nasal spray    Sig: 2 sprays each nostril after sinus rinse    Dispense:  16 g    Refill:  2  . Triamcinolone Acetonide (TRIAMCINOLONE 0.1 % CREAM : EUCERIN) CREA    Sig: Apply 1 application topically 2 (two) times daily as needed.    Dispense:  1 each    Refill:  1    Medications Discontinued During This Encounter  Medication Reason  . fluticasone (FLONASE) 50 MCG/ACT nasal spray      The patient was counseled, risk factors were discussed, anticipatory guidance given.  Gross side effects, risk and benefits, and alternatives of medications discussed with patient.  Patient is aware that all medications have potential side effects and we are unable to predict every side effect or drug-drug interaction that may occur.  Expresses verbal understanding and consents to current therapy plan and treatment regimen.  Return for 2) 30mo- [facility]- sooner if concerns arise!!.  Please see AVS handed out to patient at the end of our visit for further patient instructions/ counseling done pertaining to today's office visit.    Note:  This document was prepared using Dragon voice recognition software and may include unintentional dictation errors.   This document serves as a record of services personally performed by Mellody Dance, DO. It was created on her behalf by Toni Amend, a trained medical scribe. The  creation of this record is based on the scribe's personal observations and the provider's statements to them.   I have reviewed the above medical documentation for accuracy and completeness and I concur.  Mellody Dance, DO 12/12/2018 7:56 PM       Subjective:    HPI: Christian Bennett is a 85 y.o. male who presents to [place] [facility] today for follow up of [place].    Overall, patient largely denies chronic concerns.  Dietary Habits Per family member in office today (daughter?) he's been slowing down on cooking for himself, so she and her sister have been helping to supplement his meals.  Patient states "my problem is that I like just about everything I can get a hold of to eat!"  Per family member, sometimes the patient has to take imodium to stop his diarrhea.  GERD Notes if he eats chocolate, he feels burning sometimes.  States he enjoys soda like Dr. Malachi Bonds.  He drinks maybe one cup of soda per day, and otherwise "a whole lot of water."  Ambulation at Home Notes he's only fallen once.  Denies concerns or problems getting around at home.  He has used a cane "one time, I felt kinda wobbly."  Notes that one time, "I felt I needed to carry it with me."  "The phone rang and I jumped up to answer it and fell right down in the floor."  Vitamin D Supplementation Patient continues on Vitamin D as prescribed.  Skin Concerns States he has had a rash for several days.  He also has an itchy bump on his back.  Confirms that the area on his legs sometimes bothers him.  States that this rash does sometimes itch.  Denies contact with anything new that could have caused a rash. Denies using new soaps or deodorants.  Mood States that he likes to garden outside daily and feels pleasure doing his usual activities.  Family member states that when the patient is having acid reflux and hurting, he feels down.  But when he's off the chocolate and not taking  so many sodas, his attitude is good.  HTN:  -  His blood pressure has been controlled at home.  Pt has been checking it regularly.  129/57 115/53 134/63 130/65 113/52 128/60 140/61 61/63  Pulses in 60-70 range.  - Patient reports good compliance with blood pressure medications  - Denies medication S-E   - Smoking Status noted   - He denies new onset of: chest pain, exercise intolerance, shortness of breath, dizziness, visual changes, headache, lower extremity swelling or claudication.   Last 3 blood pressure readings in our office are as follows: BP Readings from Last 3 Encounters:  12/12/18 131/70  06/20/18 137/67  02/16/18 140/66    Pulse Readings from Last 3 Encounters:  12/12/18 (!) 59  06/20/18 (!) 57  02/03/18 65    Filed Weights   12/12/18 0938  Weight: 151 lb 3.2 oz (68.6 kg)   Depression screen [facility] 2/9 12/12/2018 06/20/2018 02/16/2018  Decreased Interest 3 0 0  Down, Depressed, Hopeless 0 0 0  PHQ - 2 Score 3 0 0  Altered sleeping 0 0 0  Tired, decreased energy 0 1 0  Change in appetite 0 0 0  Feeling bad or failure about yourself  0 0 0  Trouble concentrating 0 0 0  Moving slowly or fidgety/restless 0 0 0  Suicidal thoughts 0 1 0  PHQ-9 Score 3 2 0  Difficult doing work/chores - Not difficult at all -     Patient Care Team    Relationship Specialty Notifications Start End  Mellody Dance, DO PCP - General Family Medicine  06/22/17   Gaynelle Arabian, MD Consulting Physician Orthopedic Surgery  06/22/17   Jovita Gamma, MD Consulting Physician Neurosurgery  06/22/17   Carol Ada, MD Consulting Physician Gastroenterology  06/22/17   Eustace Moore, MD Consulting Physician Neurosurgery  06/22/17      Lab Results  Component Value Date   CREATININE 1.24 06/20/2018   BUN 20 06/20/2018   NA 137 06/20/2018   K 4.2 06/20/2018   CL 99 06/20/2018   CO2 26 06/20/2018    Lab Results  Component Value Date   CHOL 146 06/23/2017    Lab  Results  Component Value Date   HDL 51 06/23/2017    Lab Results  Component Value Date   [place] 82 06/23/2017    Lab Results  Component Value Date   TRIG 65 06/23/2017    Lab Results  Component Value Date  CHOLHDL 2.9 06/23/2017    No results found for: LDLDIRECT ===================================================================   Patient Active Problem List   Diagnosis Date Noted  . HTN (hypertension) 06/22/2017    Priority: High  . Chronic pancreatitis ([place]) 06/22/2017    Priority: High  . Mood disorder ([place]) 06/22/2017    Priority: Medium  . Hypothyroidism 06/22/2017    Priority: Medium  . GERD (gastroesophageal reflux disease) 06/22/2017    Priority: Low  . Hx of colonoscopy 06/22/2017    Priority: Low  . Adjustment disorder with mixed anxiety and depressed mood 06/20/2018  . Vitamin D insufficiency 06/20/2018  . History of anemia 06/20/2018  . Medically noncompliant-   does not want to go for hearing eval 06/20/2018  . Bilateral impacted cerumen 02/07/2018  . Hearing difficulty of both ears 02/03/2018  . Environmental and seasonal allergies 02/03/2018  . Eustachian tube dysfunction, bilateral 02/03/2018  . History of vitamin D deficiency 01/30/2018  . Contact dermatitis and eczema 10/19/2017     Past Medical History:  Diagnosis Date  . Acid reflux   . Hypertension   . Thyroid disease      Past Surgical History:  Procedure Laterality Date  . INGUINAL HERNIA REPAIR    . LIPOMA EXCISION    . MELANOMA EXCISION    . REPLACEMENT TOTAL KNEE BILATERAL       Family History  Problem Relation Age of Onset  . Stroke Mother   . Stroke Father      Social History   Substance and Sexual Activity  Drug Use No  ,  Social History   Substance and Sexual Activity  Alcohol Use No  ,  Social History   Tobacco Use  Smoking Status Former Smoker  . Last attempt to quit: 1953  . Years since quitting: 67.2  Smokeless Tobacco Never Used  ,     Current Outpatient Medications on File Prior to Visit  Medication Sig Dispense Refill  . amLODipine (NORVASC) 5 MG tablet TAKE 1 TABLET BY MOUTH ONCE DAILY 90 tablet 1  . aspirin EC 81 MG tablet Take 81 mg by mouth daily.    . chlorthalidone (HYGROTON) 25 MG tablet TAKE 1 TABLET BY MOUTH ONCE DAILY 90 tablet 0  . lipase/protease/amylase (CREON) 36000 UNITS CPEP capsule Take 36,000 Units by mouth 3 (three) times daily with meals.    . NP THYROID 60 MG tablet TAKE 1 TABLET BY MOUTH ONCE DAILY BEFORE BREAKFAST 90 tablet 1  . omeprazole (PRILOSEC) 20 MG capsule Take 20 mg by mouth daily.    . sertraline (ZOLOFT) 25 MG tablet TAKE 1 TABLET BY MOUTH ONCE DAILY 90 tablet 1  . Skin Protectants, Misc. (EUCERIN) cream APPLY TWICE DAILY AS NEEDED FOR RASH OR ITCHING 454 g 1  . Vitamin D, Ergocalciferol, (DRISDOL) 50000 units CAPS capsule Take 1 capsule (50,000 Units total) by mouth every 7 (seven) days. 12 capsule 10   No current facility-administered medications on file prior to visit.      Allergies  Allergen Reactions  . Doxycycline Hives     Review of Systems:   General:  Denies fever, chills Optho/Auditory:   Denies visual changes, blurred vision Respiratory:   Denies SOB, cough, wheeze, DIB  Cardiovascular:   Denies chest pain, palpitations, painful respirations Gastrointestinal:   Denies nausea, vomiting, diarrhea.  Endocrine:     Denies new hot or cold intolerance Musculoskeletal:  Denies joint swelling, gait issues, or new unexplained myalgias/ arthralgias Skin:  Denies rash, suspicious lesions  Neurological:    Denies dizziness, unexplained weakness, numbness  Psychiatric/Behavioral:   Denies mood changes  Objective:    Blood pressure 131/70, pulse (!) 59, temperature (!) 97.5 F (36.4 C), height 5\' 10"  (1.778 m), weight 151 lb 3.2 oz (68.6 kg), SpO2 98 %.  Body mass index is 21.69 kg/m.  General: Well Developed, well nourished, and in no acute distress.  HEENT:  Normocephalic, atraumatic, pupils equal round reactive to light, neck supple, No carotid bruits, no JVD Skin: 0.6 cm hyperkeratotic and hypermelanotic raised skin lesion in the left side of his lower back, that is a little bit erythematous at the base.  In addition, patient with erythematous, flaky-type skin rash, bilateral anterior thighs, worse on the right than left.  Rash proceeds down to right lower leg and left lower legs.  Same rash present on bilateral lower back.  Otherwise, skin is warm and dry, cap RF less 2 sec Cardiac:  Carotid bruit on right, greater than left.  Regular rate and rhythm, S1, S2 WNL's, no murmurs rubs or gallops Respiratory: ECTA B/L, Not using accessory muscles, speaking in full sentences. NeuroM-Sk: Ambulates w/o assistance, moves ext * 4 w/o difficulty, sensation grossly intact.  Ext: scant edema b/l lower ext Psych: No HI/SI, judgement and insight good, Euthymic mood. Full Affect.

## 2018-12-12 NOTE — Patient Instructions (Addendum)
For relief of sinus concerns, begin using AYR or NeilMed sinus rinses twice daily.  Be sure to use distilled water, and follow all directions according to packaging.  Patient may follow his sinus rinses once daily with Flonase (two sprays to each nostril after sinus rinse).  Do not use Afrin nasal spray or any Oxymetazoline product.     How to Perform a Sinus Rinse A sinus rinse is a home treatment that is used to rinse your sinuses with a sterile mixture of salt and water (saline solution). Sinuses are air-filled spaces in your skull behind the bones of your face and forehead that open into your nasal cavity. A sinus rinse can help to clear mucus, dirt, dust, or pollen from your nasal cavity. You may do a sinus rinse when you have a cold, a virus, nasal allergy symptoms, a sinus infection, or stuffiness in your nose or sinuses. Talk with your health care provider about whether a sinus rinse might help you. What are the risks? A sinus rinse is generally safe and effective. However, there are a few risks, which include:  A burning sensation in your sinuses. This may happen if you do not make the saline solution as directed. Be sure to follow all directions when making the saline solution.  Nasal irritation.  Infection from contaminated water. This is rare, but possible. Do not do a sinus rinse if you have had ear or nasal surgery, ear infection, or blocked ears. Supplies needed:  Saline solution or powder.  Distilled or sterile water may be needed to mix with saline powder. ? You may use boiled and cooled tap water. Boil tap water for 5 minutes; cool until it is lukewarm. Use within 24 hours. ? Do not use regular tap water to mix with the saline solution.  Neti pot or nasal rinse bottle. These supplies release the saline solution into your nose and through your sinuses. Neti pots and nasal rinse bottles can be purchased at [employer], a health food store, or online. How to  perform a sinus rinse  1. Wash your hands with soap and water. 2. Wash your device according to the directions that came with the product and then dry it. 3. Use the solution that comes with your product or one that is sold separately in stores. Follow the mixing directions on the package if you need to mix with sterile or distilled water. 4. Fill the device with the amount of saline solution noted in the device instructions. 5. Stand over a sink and tilt your head sideways over the sink. 6. Place the spout of the device in your upper nostril (the one closer to the ceiling). 7. Gently pour or squeeze the saline solution into your nasal cavity. The liquid should drain out from the lower nostril if you are not too congested. 8. While rinsing, breathe through your open mouth. 9. Gently blow your nose to clear any mucus and rinse solution. Blowing too hard may cause ear pain. 10. Repeat in your other nostril. 11. Clean and rinse your device with clean water and then air-dry it. Talk with your health care provider or pharmacist if you have questions about how to do a sinus rinse. Summary  A sinus rinse is a home treatment that is used to rinse your sinuses with a sterile mixture of salt and water (saline solution).  A sinus rinse is generally safe and effective. Follow all instructions carefully.  Before doing a sinus rinse, talk with your  health care provider about whether it would be helpful for you. This information is not intended to replace advice given to you by your health care provider. Make sure you discuss any questions you have with your health care provider. Document Released: 04/17/2014 Document Revised: 07/18/2017 Document Reviewed: 07/18/2017 Elsevier Interactive Patient Education  2019 [employer].     Allergies, Adult An allergy is when your body's defense system (immune system) overreacts to an otherwise harmless substance (allergen) that you breathe in or eat or something  that touches your skin. When you come into contact with something that you are allergic to, your immune system produces certain proteins (antibodies). These proteins cause cells to release chemicals (histamines) that trigger the symptoms of an allergic reaction. Allergies often affect the nasal passages (allergic rhinitis), eyes (allergic conjunctivitis), skin (atopic dermatitis), and stomach. Allergies can be mild or severe. Allergies cannot spread from person to person (are not contagious). They can develop at any age and may be outgrown. What increases the risk? You may be at greater risk of allergies if other people in your family have allergies. What are the signs or symptoms? Symptoms depend on what type of allergy you have. They may include:  Runny, stuffy nose.  Sneezing.  Itchy mouth, ears, or throat.  Postnasal drip.  Sore throat.  Itchy, red, watery, or puffy eyes.  Skin rash or hives.  Stomach pain.  Vomiting.  Diarrhea.  Bloating.  Wheezing or coughing. People with a severe allergy to food, medicine, or an insect bite may have a life-threatening allergic reaction (anaphylaxis). Symptoms of anaphylaxis include:  Hives.  Itching.  Flushed face.  Swollen lips, tongue, or mouth.  Tight or swollen throat.  Chest pain or tightness in the chest.  Trouble breathing or shortness of breath.  Rapid heartbeat.  Dizziness or fainting.  Vomiting.  Diarrhea.  Pain in the abdomen. How is this diagnosed? This condition is diagnosed based on:  Your symptoms.  Your family and medical history.  A physical exam. You may need to see a health care provider who specializes in treating allergies (allergist). You may also have tests, including:  Skin tests to see which allergens are causing your symptoms, such as: ? Skin prick test. In this test, your skin is pricked with a tiny needle and exposed to small amounts of possible allergens to see if your skin  reacts. ? Intradermal skin test. In this test, a small amount of allergen is injected under your skin to see if your skin reacts. ? Patch test. In this test, a small amount of allergen is placed on your skin and then your skin is covered with a bandage. Your health care provider will check your skin after a couple of days to see if a rash has developed.  Blood tests.  Challenges tests. In this test, you inhale a small amount of allergen by mouth to see if you have an allergic reaction. You may also be asked to:  Keep a food diary. A food diary is a record of all the foods and drinks you have in a day and any symptoms you experience.  Practice an elimination diet. An elimination diet involves eliminating specific foods from your diet and then adding them back in one by one to find out if a certain food causes an allergic reaction. How is this treated? Treatment for allergies depends on your symptoms. Treatment may include:  Cold compresses to soothe itching and swelling.  Eye drops.  Nasal sprays.  Using a saline spray or container (neti pot) to flush out the nose (nasal irrigation). These methods can help clear away mucus and keep the nasal passages moist.  Using a humidifier.  Oral antihistamines or other medicines to block allergic reaction and inflammation.  Skin creams to treat rashes or itching.  Diet changes to eliminate food allergy triggers.  Repeated exposure to tiny amounts of allergens to build up a tolerance and prevent future allergic reactions (immunotherapy). These include: ? Allergy shots. ? Oral treatment. This involves taking small doses of an allergen under the tongue (sublingual immunotherapy).  Emergency epinephrine injection (auto-injector) in case of an allergic emergency. This is a self-injectable, pre-measured medicine that must be given within the first few minutes of a serious allergic reaction. Follow these instructions at home:         Avoid  known allergens whenever possible.  If you suffer from airborne allergens, wash out your nose daily. You can do this with a saline spray or a neti pot to flush out your nose (nasal irrigation).  Take over-the-counter and prescription medicines only as told by your health care provider.  Keep all follow-up visits as told by your health care provider. This is important.  If you are at risk of a severe allergic reaction (anaphylaxis), keep your auto-injector with you at all times.  If you have ever had anaphylaxis, wear a medical alert bracelet or necklace that states you have a severe allergy. Contact a health care provider if:  Your symptoms do not improve with treatment. Get help right away if:  You have symptoms of anaphylaxis, such as: ? Swollen mouth, tongue, or throat. ? Pain or tightness in your chest. ? Trouble breathing or shortness of breath. ? Dizziness or fainting. ? Severe abdominal pain, vomiting, or diarrhea. This information is not intended to replace advice given to you by your health care provider. Make sure you discuss any questions you have with your health care provider. Document Released: 12/14/2002 Document Revised: 01/19/2017 Document Reviewed: 04/07/2016 Elsevier Interactive Patient Education  2019 [employer].

## 2018-12-13 LAB — CBC WITH DIFFERENTIAL/PLATELET
BASOS: 2 %
Basophils Absolute: 0.1 10*3/uL (ref 0.0–0.2)
EOS (ABSOLUTE): 0.9 10*3/uL — AB (ref 0.0–0.4)
EOS: 15 %
HEMATOCRIT: 35.6 % — AB (ref 37.5–51.0)
Hemoglobin: 12.1 g/dL — ABNORMAL LOW (ref 13.0–17.7)
IMMATURE GRANS (ABS): 0 10*3/uL (ref 0.0–0.1)
Immature Granulocytes: 0 %
LYMPHS: 13 %
Lymphocytes Absolute: 0.8 10*3/uL (ref 0.7–3.1)
MCH: 30.7 pg (ref 26.6–33.0)
MCHC: 34 g/dL (ref 31.5–35.7)
MCV: 90 fL (ref 79–97)
Monocytes Absolute: 0.8 10*3/uL (ref 0.1–0.9)
Monocytes: 13 %
NEUTROS ABS: 3.6 10*3/uL (ref 1.4–7.0)
Neutrophils: 57 %
Platelets: 185 10*3/uL (ref 150–450)
RBC: 3.94 x10E6/uL — ABNORMAL LOW (ref 4.14–5.80)
RDW: 12.8 % (ref 11.6–15.4)
WBC: 6.2 10*3/uL (ref 3.4–10.8)

## 2018-12-13 LAB — COMPREHENSIVE METABOLIC PANEL
ALT: 14 IU/L (ref 0–44)
AST: 16 IU/L (ref 0–40)
Albumin/Globulin Ratio: 1.7 (ref 1.2–2.2)
Albumin: 4.4 g/dL (ref 3.6–4.6)
Alkaline Phosphatase: 80 IU/L (ref 39–117)
BILIRUBIN TOTAL: 0.3 mg/dL (ref 0.0–1.2)
BUN/Creatinine Ratio: 18 (ref 10–24)
BUN: 23 mg/dL (ref 8–27)
CHLORIDE: 96 mmol/L (ref 96–106)
CO2: 23 mmol/L (ref 20–29)
Calcium: 9.6 mg/dL (ref 8.6–10.2)
Creatinine, Ser: 1.29 mg/dL — ABNORMAL HIGH (ref 0.76–1.27)
GFR calc non Af Amer: 49 mL/min/{1.73_m2} — ABNORMAL LOW (ref >59)
GFR, EST AFRICAN AMERICAN: 57 mL/min/{1.73_m2} — AB (ref >59)
Globulin, Total: 2.6 g/dL (ref 1.5–4.5)
Glucose: 81 mg/dL (ref 65–99)
Potassium: 4.4 mmol/L (ref 3.5–5.2)
Sodium: 134 mmol/L (ref 134–144)
Total Protein: 7 g/dL (ref 6.0–8.5)

## 2018-12-13 LAB — T3, FREE: T3, Free: 3.4 pg/mL (ref 2.0–4.4)

## 2018-12-13 LAB — VITAMIN D 25 HYDROXY (VIT D DEFICIENCY, FRACTURES): Vit D, 25-Hydroxy: 61.1 ng/mL (ref 30.0–100.0)

## 2018-12-13 LAB — T4, FREE: Free T4: 0.89 ng/dL (ref 0.82–1.77)

## 2018-12-13 LAB — TSH: TSH: 2.26 u[IU]/mL (ref 0.450–4.500)

## 2018-12-18 DIAGNOSIS — L820 Inflamed seborrheic keratosis: Secondary | ICD-10-CM | POA: Diagnosis not present

## 2018-12-18 DIAGNOSIS — D1801 Hemangioma of skin and subcutaneous tissue: Secondary | ICD-10-CM | POA: Diagnosis not present

## 2018-12-18 DIAGNOSIS — L570 Actinic keratosis: Secondary | ICD-10-CM | POA: Diagnosis not present

## 2018-12-18 DIAGNOSIS — L821 Other seborrheic keratosis: Secondary | ICD-10-CM | POA: Diagnosis not present

## 2018-12-18 DIAGNOSIS — L308 Other specified dermatitis: Secondary | ICD-10-CM | POA: Diagnosis not present

## 2018-12-18 DIAGNOSIS — Z85820 Personal history of malignant melanoma of skin: Secondary | ICD-10-CM | POA: Diagnosis not present

## 2019-02-18 ENCOUNTER — Other Ambulatory Visit: Payer: Self-pay | Admitting: Family Medicine

## 2019-02-18 DIAGNOSIS — I10 Essential (primary) hypertension: Secondary | ICD-10-CM

## 2019-02-20 ENCOUNTER — Other Ambulatory Visit: Payer: Self-pay | Admitting: Family Medicine

## 2019-04-09 ENCOUNTER — Other Ambulatory Visit: Payer: Self-pay | Admitting: Family Medicine

## 2019-04-09 DIAGNOSIS — E039 Hypothyroidism, unspecified: Secondary | ICD-10-CM

## 2019-04-23 ENCOUNTER — Other Ambulatory Visit: Payer: Self-pay | Admitting: Family Medicine

## 2019-04-23 DIAGNOSIS — I10 Essential (primary) hypertension: Secondary | ICD-10-CM

## 2019-04-24 ENCOUNTER — Telehealth: Payer: Self-pay | Admitting: Family Medicine

## 2019-04-24 NOTE — Telephone Encounter (Signed)
Patient was prescribed triamcinolone cream yesterday and the patient states it is different than what he typically gets (the one that was sent yesterday does not have the Eucerin in it). He wants to make sure that it is ok for him to use or was there a mistake and the wrong cream was sent. Please advise

## 2019-04-25 ENCOUNTER — Other Ambulatory Visit: Payer: Self-pay

## 2019-04-25 DIAGNOSIS — L259 Unspecified contact dermatitis, unspecified cause: Secondary | ICD-10-CM

## 2019-04-25 MED ORDER — TRIAMCINOLONE 0.1 % CREAM:EUCERIN CREAM 1:1: 1.0000 "application " | TOPICAL_CREAM | Freq: Two times a day (BID) | CUTANEOUS | 1 refills | Status: DC | PRN

## 2019-04-25 NOTE — Telephone Encounter (Signed)
Called patient left message to call the office. MPulliam, CMA/RT(R)  

## 2019-04-25 NOTE — Telephone Encounter (Signed)
Called called in medication to the pharmacy. MPulliam, CMA/RT(R)

## 2019-05-16 ENCOUNTER — Other Ambulatory Visit: Payer: Self-pay | Admitting: Family Medicine

## 2019-05-23 DIAGNOSIS — R197 Diarrhea, unspecified: Secondary | ICD-10-CM | POA: Diagnosis not present

## 2019-05-23 DIAGNOSIS — R143 Flatulence: Secondary | ICD-10-CM | POA: Diagnosis not present

## 2019-05-24 ENCOUNTER — Telehealth: Payer: Self-pay | Admitting: Family Medicine

## 2019-05-24 NOTE — Telephone Encounter (Signed)
Yes, patient needs to be evaluated for this condition bu UC now.   I have never seen him for this and have never known him to have this condition.  I do not feel comfortable giving an 85 year old this medicine and assuming it is just his "regular vertigo".  He needs evaluation now- do not delay until next week!

## 2019-05-24 NOTE — Telephone Encounter (Signed)
Spoke with pt's daughter who states that the patient has a history of vertigo and meclizine usually helps.  Advised daughter that pt has not seen Dr. Raliegh Scarlet or addressed this PMH, he may need to be seen at [facility].  Please advise on your disposition.  Charyl Bigger, CMA

## 2019-05-24 NOTE — Telephone Encounter (Signed)
Patient's daughter Judson Roch called stating that her father woke up with a bad case of vertigo, advised patient Dr. Jenetta Downer was completely booked so Judson Roch has requested if we could send a prescription for meclizine which has helped greatly in the past when these episodes develop. If approved please send to [facility] on [address] [place]

## 2019-05-24 NOTE — Telephone Encounter (Signed)
Pt's daughter informed.  Sarah expressed understanding and is agreeable.  Charyl Bigger, CMA

## 2019-06-19 ENCOUNTER — Other Ambulatory Visit: Payer: Self-pay | Admitting: Family Medicine

## 2019-06-19 ENCOUNTER — Encounter: Payer: Self-pay | Admitting: Family Medicine

## 2019-06-19 ENCOUNTER — Ambulatory Visit (INDEPENDENT_AMBULATORY_CARE_PROVIDER_SITE_OTHER): Payer: Medicare Other | Admitting: Family Medicine

## 2019-06-19 ENCOUNTER — Other Ambulatory Visit: Payer: Self-pay

## 2019-06-19 VITALS — BP 150/64 | HR 59 | Resp 18 | Ht 70.0 in | Wt 155.6 lb

## 2019-06-19 DIAGNOSIS — H9190 Unspecified hearing loss, unspecified ear: Secondary | ICD-10-CM

## 2019-06-19 DIAGNOSIS — Z0000 Encounter for general adult medical examination without abnormal findings: Secondary | ICD-10-CM | POA: Diagnosis not present

## 2019-06-19 DIAGNOSIS — R4189 Other symptoms and signs involving cognitive functions and awareness: Secondary | ICD-10-CM

## 2019-06-19 DIAGNOSIS — R413 Other amnesia: Secondary | ICD-10-CM | POA: Diagnosis not present

## 2019-06-19 DIAGNOSIS — E039 Hypothyroidism, unspecified: Secondary | ICD-10-CM

## 2019-06-19 MED ORDER — ZOSTER VAC RECOMB ADJUVANTED 50 MCG/0.5ML IM SUSR: 0.5000 mL | Freq: Once | INTRAMUSCULAR | 0 refills | Status: AC

## 2019-06-19 NOTE — Progress Notes (Signed)
Subjective:   Christian Bennett is a 85 y.o. male who presents for Medicare Annual/Subsequent preventive examination.     Objective:    Vitals: BP (!) 150/64 (BP Location: Left Arm, Patient Position: Sitting)   Pulse (!) 59   Resp 18   Ht 5\' 10"  (1.778 m)   Wt 155 lb 9.6 oz (70.6 kg)   SpO2 99%   BMI 22.33 kg/m   Body mass index is 22.33 kg/m.  Advanced Directives 02/16/2018 06/22/2017  Does Patient Have a Medical Advance Directive? No Yes  Type of Advance Directive - Living will;Healthcare Power of Attorney  Would patient like information on creating a medical advance directive? Yes (MAU/Ambulatory/Procedural Areas - Information given) -    Tobacco Social History   Tobacco Use  Smoking Status Former Smoker  . Quit date: 71  . Years since quitting: 67.7  Smokeless Tobacco Never Used     Counseling given: Not Answered   Clinical Intake:                       Past Medical History:  Diagnosis Date  . Acid reflux   . Hypertension   . Thyroid disease    Past Surgical History:  Procedure Laterality Date  . INGUINAL HERNIA REPAIR    . LIPOMA EXCISION    . MELANOMA EXCISION    . REPLACEMENT TOTAL KNEE BILATERAL     Family History  Problem Relation Age of Onset  . Stroke Mother   . Stroke Father    Social History   Socioeconomic History  . Marital status: Widowed    Spouse name: Not on file  . Number of children: Not on file  . Years of education: Not on file  . Highest education level: Not on file  Occupational History  . Not on file  Social Needs  . Financial resource strain: Not on file  . Food insecurity    Worry: Not on file    Inability: Not on file  . Transportation needs    Medical: Not on file    Non-medical: Not on file  Tobacco Use  . Smoking status: Former Smoker    Quit date: 1953    Years since quitting: 67.7  . Smokeless tobacco: Never Used  Substance and Sexual Activity  . Alcohol use: No  . Drug use: No  . Sexual  activity: Not Currently    Birth control/protection: None  Lifestyle  . Physical activity    Days per week: Not on file    Minutes per session: Not on file  . Stress: Not on file  Relationships  . Social Herbalist on phone: Not on file    Gets together: Not on file    Attends religious service: Not on file    Active member of club or organization: Not on file    Attends meetings of clubs or organizations: Not on file    Relationship status: Not on file  Other Topics Concern  . Not on file  Social History Narrative  . Not on file    Outpatient Encounter Medications as of 06/19/2019  Medication Sig  . amLODipine (NORVASC) 5 MG tablet Take 1 tablet by mouth once daily  . aspirin EC 81 MG tablet Take 81 mg by mouth daily.  . chlorthalidone (HYGROTON) 25 MG tablet Take 1 tablet by mouth once daily  . fluticasone (FLONASE) 50 MCG/ACT nasal spray 2 sprays each nostril after sinus  rinse  . lipase/protease/amylase (CREON) 36000 UNITS CPEP capsule Take 36,000 Units by mouth 3 (three) times daily with meals.  . NP THYROID 60 MG tablet TAKE 1 TABLET BY MOUTH ONCE DAILY BEFORE BREAKFAST  . sertraline (ZOLOFT) 25 MG tablet Take 1 tablet by mouth once daily  . Skin Protectants, Misc. (EUCERIN) cream APPLY TWICE DAILY AS NEEDED FOR RASH OR ITCHING  . Triamcinolone Acetonide (TRIAMCINOLONE 0.1 % CREAM : EUCERIN) CREA Apply 1 application topically 2 (two) times daily as needed.  . triamcinolone cream (KENALOG) 0.1 % APPLY TOPICALLY TWICE DAILY AS NEEDED  . Vitamin D, Ergocalciferol, (DRISDOL) 50000 units CAPS capsule Take 1 capsule (50,000 Units total) by mouth every 7 (seven) days.  . Zoster Vaccine Adjuvanted [facility]) injection Inject 0.5 mLs into the muscle once for 1 dose.  . [DISCONTINUED] omeprazole (PRILOSEC) 20 MG capsule Take 20 mg by mouth daily.   No facility-administered encounter medications on file as of 06/19/2019.     Activities of Daily Living In your present state  of health, do you have any difficulty performing the following activities: 06/19/2019 12/12/2018  Hearing? Tempie Donning  Vision? Y N  Difficulty concentrating or making decisions? Y N  Walking or climbing stairs? Y Y  Dressing or bathing? N N  Doing errands, shopping? Tempie Donning  Some recent data might be hidden    Patient Care Team: Mellody Dance, DO as PCP - General (Family Medicine) Gaynelle Arabian, MD as Consulting Physician (Orthopedic Surgery) Jovita Gamma, MD as Consulting Physician (Neurosurgery) Carol Ada, MD as Consulting Physician (Gastroenterology) Eustace Moore, MD as Consulting Physician (Neurosurgery)   Assessment:   This is a routine wellness examination for Christian Bennett.  Exercise Activities and Dietary recommendations    Goals   None     Fall Risk Fall Risk  06/19/2019 12/12/2018 06/20/2018 02/03/2018 10/19/2017  Falls in the past year? 1 0 No No No  Number falls in past yr: 1 - - - -  Injury with Fall? 1 - - - -  Risk for fall due to : - Impaired balance/gait - - -  Follow up - Falls evaluation completed - - -   Is the patient's home free of loose throw rugs in walkways, pet beds, electrical cords, etc?   yes      Grab bars in the bathroom? no      Handrails on the stairs?   no      Adequate lighting?   yes  Timed Get Up and Go Performed: no trouble  Depression Screen PHQ 2/9 Scores 06/19/2019 12/12/2018 06/20/2018 02/16/2018  PHQ - 2 Score 1 3 0 0  PHQ- 9 Score 5 3 2  0    Cognitive Function MMSE - Mini Mental State Exam 06/19/2019 02/16/2018  Orientation to time 3 4  Orientation to Place 5 4  Registration - 3  Attention/ Calculation 3 0  Recall 0 1  Language- name 2 objects 1 2  Language- repeat 1 1  Language- follow 3 step command 3 3  Language- read & follow direction 1 1  Write a sentence 0 1  Copy design 0 1  Total score - 21     6CIT Screen 06/19/2019 02/16/2018  What Year? 0 points 0 points  What month? 3 points 0 points  What time? 3 points 0 points   Count back from 20 2 points 2 points  Months in reverse 4 points 0 points  Repeat phrase 10 points 10 points  Total Score  22 12    Immunization History  Administered Date(s) Administered  . Pneumococcal Conjugate-13 08/02/2014  . Tdap 08/02/2014    Qualifies for Shingles Vaccine? yes  Screening Tests Health Maintenance  Topic Date Due  . INFLUENZA VACCINE  05/05/2019  . PNA vac Low Risk Adult (2 of 2 - PPSV23) 12/12/2019 (Originally 08/03/2015)  . TETANUS/TDAP  08/02/2024   Cancer Screenings: Lung: Low Dose CT Chest recommended if Age 58-80 years, 30 pack-year currently smoking OR have quit w/in 15years. Patient does not qualify. Colorectal: does not  Additional Screenings: discussed unsure Hepatitis C Screening:      Plan:    -Discussed in detail in addition to seeing the patient today with his daughter Judson Roch the concerns I have for patient including his cognitive and memory declines as well as his hearing decline.    - I recommend that patient get evaluated by neurology for these changes and the patient and family both wish to wait at this time.  We will reconvene in 4 to 6 weeks via a Doxy visit to discuss their considerations and final decision on what to do next.  -Labs are updated and were done 6 months ago.  All stable.  -I recommend patient get pneumonia vaccines, flu vaccine as well as shingles.  We will send this to the pharmacy as per discussed with his daughter who is an Therapist, sports as well.  -She/patient's daughter was upset that she could not come into the room with him while I evaluated him and I explained that I would not be able to appreciate his true neuro cognitive decline as well as memory issues if there was somebody supporting and interjecting during the interview.  I also explained that this was safer for her as well as him due to [place].  I Apologized to her if she did not agree with our medical decision making but explained I felt this was best for the patient  in several ways  -Furthermore thank you for coming to the office visit today with her father as this is the first time have ever seen any family members with the patient.  -Patient will go and get his hearing aids reevaluated by the company he got them out.  They need to be adjusted.  -He will use a cane when ambulating around the house that he has fallen due to his accident and tripping but has not fallen without being an accident.  I have personally reviewed and noted the following in the patient's chart:   . Medical and social history . Use of alcohol, tobacco or illicit drugs  . Current medications and supplements . Functional ability and status . Nutritional status . Physical activity . Advanced directives . List of other physicians . Hospitalizations, surgeries, and ER visits in previous 12 months . Vitals . Screenings to include cognitive, depression, and falls . Referrals and appointments  In addition, I have reviewed and discussed with patient certain preventive protocols, quality metrics, and best practice recommendations. A written personalized care plan for preventive services as well as general preventive health recommendations were provided to patient.     Mellody Dance, DO  06/19/2019

## 2019-06-19 NOTE — Patient Instructions (Signed)
I recommend no driving any longer.  I have discussed this with the patient as well, due to his age and decline, this is no longer safe for him to do.  -Recommend patient use a cane for assistance with walking instability.  To help avoid tripping or accident such as happened in the past year  -I recommend neurology evaluation but we will reconvene and discuss in 4 to 6 weeks during our video visit in the future.  -

## 2019-06-20 ENCOUNTER — Other Ambulatory Visit: Payer: Self-pay | Admitting: Family Medicine

## 2019-07-10 ENCOUNTER — Inpatient Hospital Stay (HOSPITAL_COMMUNITY)
Admission: EM | Admit: 2019-07-10 | Discharge: 2019-07-12 | DRG: 065 | Disposition: A | Discharge status: Home / Self Care | Payer: Medicare Other | Attending: Internal Medicine | Admitting: Internal Medicine

## 2019-07-10 ENCOUNTER — Other Ambulatory Visit: Payer: Self-pay

## 2019-07-10 ENCOUNTER — Emergency Department (HOSPITAL_COMMUNITY): Payer: Medicare Other

## 2019-07-10 ENCOUNTER — Observation Stay (HOSPITAL_COMMUNITY): Payer: Medicare Other

## 2019-07-10 ENCOUNTER — Encounter (HOSPITAL_COMMUNITY): Payer: Self-pay

## 2019-07-10 DIAGNOSIS — Z20828 Contact with and (suspected) exposure to other viral communicable diseases: Secondary | ICD-10-CM | POA: Diagnosis not present

## 2019-07-10 DIAGNOSIS — K861 Other chronic pancreatitis: Secondary | ICD-10-CM | POA: Diagnosis not present

## 2019-07-10 DIAGNOSIS — Z79890 Hormone replacement therapy (postmenopausal): Secondary | ICD-10-CM

## 2019-07-10 DIAGNOSIS — Z881 Allergy status to other antibiotic agents status: Secondary | ICD-10-CM

## 2019-07-10 DIAGNOSIS — Z87891 Personal history of nicotine dependence: Secondary | ICD-10-CM

## 2019-07-10 DIAGNOSIS — I444 Left anterior fascicular block: Secondary | ICD-10-CM | POA: Diagnosis present

## 2019-07-10 DIAGNOSIS — N181 Chronic kidney disease, stage 1: Secondary | ICD-10-CM | POA: Diagnosis present

## 2019-07-10 DIAGNOSIS — R531 Weakness: Secondary | ICD-10-CM | POA: Diagnosis not present

## 2019-07-10 DIAGNOSIS — Z79899 Other long term (current) drug therapy: Secondary | ICD-10-CM

## 2019-07-10 DIAGNOSIS — E039 Hypothyroidism, unspecified: Secondary | ICD-10-CM | POA: Diagnosis present

## 2019-07-10 DIAGNOSIS — R29810 Facial weakness: Secondary | ICD-10-CM | POA: Diagnosis present

## 2019-07-10 DIAGNOSIS — I129 Hypertensive chronic kidney disease with stage 1 through stage 4 chronic kidney disease, or unspecified chronic kidney disease: Secondary | ICD-10-CM | POA: Diagnosis present

## 2019-07-10 DIAGNOSIS — R29818 Other symptoms and signs involving the nervous system: Secondary | ICD-10-CM | POA: Diagnosis not present

## 2019-07-10 DIAGNOSIS — I639 Cerebral infarction, unspecified: Secondary | ICD-10-CM | POA: Diagnosis not present

## 2019-07-10 DIAGNOSIS — I10 Essential (primary) hypertension: Secondary | ICD-10-CM | POA: Diagnosis present

## 2019-07-10 DIAGNOSIS — G8324 Monoplegia of upper limb affecting left nondominant side: Secondary | ICD-10-CM | POA: Diagnosis present

## 2019-07-10 DIAGNOSIS — I63131 Cerebral infarction due to embolism of right carotid artery: Principal | ICD-10-CM | POA: Diagnosis present

## 2019-07-10 DIAGNOSIS — G459 Transient cerebral ischemic attack, unspecified: Secondary | ICD-10-CM | POA: Diagnosis not present

## 2019-07-10 DIAGNOSIS — I6782 Cerebral ischemia: Secondary | ICD-10-CM | POA: Diagnosis not present

## 2019-07-10 DIAGNOSIS — R402242 Coma scale, best verbal response, confused conversation, at arrival to emergency department: Secondary | ICD-10-CM | POA: Diagnosis present

## 2019-07-10 DIAGNOSIS — Z66 Do not resuscitate: Secondary | ICD-10-CM | POA: Diagnosis present

## 2019-07-10 DIAGNOSIS — R29701 NIHSS score 1: Secondary | ICD-10-CM | POA: Diagnosis present

## 2019-07-10 DIAGNOSIS — E785 Hyperlipidemia, unspecified: Secondary | ICD-10-CM | POA: Diagnosis present

## 2019-07-10 DIAGNOSIS — Z96653 Presence of artificial knee joint, bilateral: Secondary | ICD-10-CM | POA: Diagnosis present

## 2019-07-10 DIAGNOSIS — I63231 Cerebral infarction due to unspecified occlusion or stenosis of right carotid arteries: Secondary | ICD-10-CM

## 2019-07-10 DIAGNOSIS — I63311 Cerebral infarction due to thrombosis of right middle cerebral artery: Secondary | ICD-10-CM

## 2019-07-10 DIAGNOSIS — E559 Vitamin D deficiency, unspecified: Secondary | ICD-10-CM | POA: Diagnosis present

## 2019-07-10 DIAGNOSIS — E876 Hypokalemia: Secondary | ICD-10-CM | POA: Diagnosis present

## 2019-07-10 DIAGNOSIS — R402362 Coma scale, best motor response, obeys commands, at arrival to emergency department: Secondary | ICD-10-CM | POA: Diagnosis present

## 2019-07-10 DIAGNOSIS — R4781 Slurred speech: Secondary | ICD-10-CM | POA: Diagnosis not present

## 2019-07-10 DIAGNOSIS — F39 Unspecified mood [affective] disorder: Secondary | ICD-10-CM | POA: Diagnosis present

## 2019-07-10 DIAGNOSIS — Z85820 Personal history of malignant melanoma of skin: Secondary | ICD-10-CM

## 2019-07-10 DIAGNOSIS — K8681 Exocrine pancreatic insufficiency: Secondary | ICD-10-CM | POA: Diagnosis present

## 2019-07-10 DIAGNOSIS — I959 Hypotension, unspecified: Secondary | ICD-10-CM | POA: Diagnosis not present

## 2019-07-10 DIAGNOSIS — R402142 Coma scale, eyes open, spontaneous, at arrival to emergency department: Secondary | ICD-10-CM | POA: Diagnosis present

## 2019-07-10 DIAGNOSIS — I6521 Occlusion and stenosis of right carotid artery: Secondary | ICD-10-CM | POA: Diagnosis not present

## 2019-07-10 DIAGNOSIS — H9190 Unspecified hearing loss, unspecified ear: Secondary | ICD-10-CM | POA: Diagnosis present

## 2019-07-10 DIAGNOSIS — Z823 Family history of stroke: Secondary | ICD-10-CM

## 2019-07-10 DIAGNOSIS — I6621 Occlusion and stenosis of right posterior cerebral artery: Secondary | ICD-10-CM | POA: Diagnosis present

## 2019-07-10 DIAGNOSIS — K219 Gastro-esophageal reflux disease without esophagitis: Secondary | ICD-10-CM | POA: Diagnosis present

## 2019-07-10 LAB — RAPID URINE DRUG SCREEN, HOSP PERFORMED
Amphetamines: NOT DETECTED
Barbiturates: NOT DETECTED
Benzodiazepines: NOT DETECTED
Cocaine: NOT DETECTED
Opiates: NOT DETECTED
Tetrahydrocannabinol: NOT DETECTED

## 2019-07-10 LAB — APTT: aPTT: 27 seconds (ref 24–36)

## 2019-07-10 LAB — COMPREHENSIVE METABOLIC PANEL
ALT: 14 U/L (ref 0–44)
AST: 23 U/L (ref 15–41)
Albumin: 3.7 g/dL (ref 3.5–5.0)
Alkaline Phosphatase: 71 U/L (ref 38–126)
Anion gap: 11 (ref 5–15)
BUN: 14 mg/dL (ref 8–23)
CO2: 25 mmol/L (ref 22–32)
Calcium: 9 mg/dL (ref 8.9–10.3)
Chloride: 102 mmol/L (ref 98–111)
Creatinine, Ser: 1.42 mg/dL — ABNORMAL HIGH (ref 0.61–1.24)
GFR calc Af Amer: 51 mL/min — ABNORMAL LOW (ref >60)
GFR calc non Af Amer: 44 mL/min — ABNORMAL LOW (ref >60)
Glucose, Bld: 111 mg/dL — ABNORMAL HIGH (ref 70–99)
Potassium: 3.1 mmol/L — ABNORMAL LOW (ref 3.5–5.1)
Sodium: 138 mmol/L (ref 135–145)
Total Bilirubin: 0.5 mg/dL (ref 0.3–1.2)
Total Protein: 6.8 g/dL (ref 6.5–8.1)

## 2019-07-10 LAB — DIFFERENTIAL
Abs Immature Granulocytes: 0.01 10*3/uL (ref 0.00–0.07)
Basophils Absolute: 0.1 10*3/uL (ref 0.0–0.1)
Basophils Relative: 1 %
Eosinophils Absolute: 0.4 10*3/uL (ref 0.0–0.5)
Eosinophils Relative: 6 %
Immature Granulocytes: 0 %
Lymphocytes Relative: 25 %
Lymphs Abs: 1.5 10*3/uL (ref 0.7–4.0)
Monocytes Absolute: 0.7 10*3/uL (ref 0.1–1.0)
Monocytes Relative: 11 %
Neutro Abs: 3.4 10*3/uL (ref 1.7–7.7)
Neutrophils Relative %: 57 %

## 2019-07-10 LAB — PROTIME-INR
INR: 1.1 (ref 0.8–1.2)
Prothrombin Time: 13.8 seconds (ref 11.4–15.2)

## 2019-07-10 LAB — I-STAT CHEM 8, ED
BUN: 15 mg/dL (ref 8–23)
Calcium, Ion: 1.18 mmol/L (ref 1.15–1.40)
Chloride: 101 mmol/L (ref 98–111)
Creatinine, Ser: 1.5 mg/dL — ABNORMAL HIGH (ref 0.61–1.24)
Glucose, Bld: 108 mg/dL — ABNORMAL HIGH (ref 70–99)
HCT: 35 % — ABNORMAL LOW (ref 39.0–52.0)
Hemoglobin: 11.9 g/dL — ABNORMAL LOW (ref 13.0–17.0)
Potassium: 3.1 mmol/L — ABNORMAL LOW (ref 3.5–5.1)
Sodium: 139 mmol/L (ref 135–145)
TCO2: 23 mmol/L (ref 22–32)

## 2019-07-10 LAB — CBG MONITORING, ED: Glucose-Capillary: 105 mg/dL — ABNORMAL HIGH (ref 70–99)

## 2019-07-10 LAB — CBC
HCT: 34 % — ABNORMAL LOW (ref 39.0–52.0)
Hemoglobin: 11.2 g/dL — ABNORMAL LOW (ref 13.0–17.0)
MCH: 30.9 pg (ref 26.0–34.0)
MCHC: 32.9 g/dL (ref 30.0–36.0)
MCV: 93.7 fL (ref 80.0–100.0)
Platelets: 186 10*3/uL (ref 150–400)
RBC: 3.63 MIL/uL — ABNORMAL LOW (ref 4.22–5.81)
RDW: 13.3 % (ref 11.5–15.5)
WBC: 6 10*3/uL (ref 4.0–10.5)
nRBC: 0 % (ref 0.0–0.2)

## 2019-07-10 LAB — SARS CORONAVIRUS 2 BY RT PCR (HOSPITAL ORDER, PERFORMED IN ~~LOC~~ HOSPITAL LAB): SARS Coronavirus 2: NEGATIVE

## 2019-07-10 MED ORDER — ATORVASTATIN CALCIUM 40 MG PO TABS
40.0000 mg | ORAL_TABLET | Freq: Every day | ORAL | Status: DC
  Administered 2019-07-10 – 2019-07-11 (×2): 40 mg via ORAL
  Filled 2019-07-10 (×2): qty 1

## 2019-07-10 MED ORDER — ENOXAPARIN SODIUM 40 MG/0.4ML ~~LOC~~ SOLN
40.0000 mg | SUBCUTANEOUS | Status: DC
  Administered 2019-07-11: 40 mg via SUBCUTANEOUS
  Filled 2019-07-10: qty 0.4

## 2019-07-10 MED ORDER — POTASSIUM CHLORIDE 10 MEQ/100ML IV SOLN
10.0000 meq | Freq: Once | INTRAVENOUS | Status: AC
  Administered 2019-07-10: 10 meq via INTRAVENOUS
  Filled 2019-07-10: qty 100

## 2019-07-10 MED ORDER — THYROID 60 MG PO TABS
60.0000 mg | ORAL_TABLET | Freq: Every day | ORAL | Status: DC
  Administered 2019-07-11 – 2019-07-12 (×2): 60 mg via ORAL
  Filled 2019-07-10 (×2): qty 1

## 2019-07-10 MED ORDER — SERTRALINE HCL 25 MG PO TABS
25.0000 mg | ORAL_TABLET | Freq: Every day | ORAL | Status: DC
  Administered 2019-07-11 – 2019-07-12 (×2): 25 mg via ORAL
  Filled 2019-07-10 (×2): qty 1

## 2019-07-10 MED ORDER — ACETAMINOPHEN 160 MG/5ML PO SOLN: 650.0000 mg | ORAL | Status: DC | PRN

## 2019-07-10 MED ORDER — STROKE: EARLY STAGES OF RECOVERY BOOK
Freq: Once | Status: AC
  Administered 2019-07-10: 18:00:00 1
  Filled 2019-07-10: qty 1

## 2019-07-10 MED ORDER — PANCRELIPASE (LIP-PROT-AMYL) 36000-114000 UNITS PO CPEP
36000.0000 [IU] | ORAL_CAPSULE | Freq: Three times a day (TID) | ORAL | Status: DC
  Administered 2019-07-11 – 2019-07-12 (×5): 36000 [IU] via ORAL
  Filled 2019-07-10 (×6): qty 1

## 2019-07-10 MED ORDER — ASPIRIN EC 325 MG PO TBEC
325.0000 mg | DELAYED_RELEASE_TABLET | Freq: Once | ORAL | Status: AC
  Administered 2019-07-10: 325 mg via ORAL
  Filled 2019-07-10: qty 1

## 2019-07-10 MED ORDER — CLOPIDOGREL BISULFATE 300 MG PO TABS
300.0000 mg | ORAL_TABLET | Freq: Once | ORAL | Status: AC
  Administered 2019-07-10: 300 mg via ORAL
  Filled 2019-07-10: qty 1

## 2019-07-10 MED ORDER — SENNOSIDES-DOCUSATE SODIUM 8.6-50 MG PO TABS: 1.0000 | ORAL_TABLET | Freq: Every evening | ORAL | Status: DC | PRN

## 2019-07-10 MED ORDER — ACETAMINOPHEN 650 MG RE SUPP: 650.0000 mg | RECTAL | Status: DC | PRN

## 2019-07-10 MED ORDER — CLOPIDOGREL BISULFATE 75 MG PO TABS
75.0000 mg | ORAL_TABLET | Freq: Every day | ORAL | Status: DC
  Administered 2019-07-11 – 2019-07-12 (×2): 75 mg via ORAL
  Filled 2019-07-10 (×3): qty 1

## 2019-07-10 MED ORDER — PANTOPRAZOLE SODIUM 40 MG PO TBEC
40.0000 mg | DELAYED_RELEASE_TABLET | Freq: Every day | ORAL | Status: DC
  Administered 2019-07-11 – 2019-07-12 (×2): 40 mg via ORAL
  Filled 2019-07-10 (×2): qty 1

## 2019-07-10 MED ORDER — ACETAMINOPHEN 325 MG PO TABS: 650.0000 mg | ORAL_TABLET | ORAL | Status: DC | PRN

## 2019-07-10 MED ORDER — SODIUM CHLORIDE 0.9 % IV SOLN
INTRAVENOUS | Status: DC
  Administered 2019-07-10: 18:00:00 via INTRAVENOUS

## 2019-07-10 MED ORDER — GADOBUTROL 1 MMOL/ML IV SOLN
7.0000 mL | Freq: Once | INTRAVENOUS | Status: AC | PRN
  Administered 2019-07-10: 21:00:00 7 mL via INTRAVENOUS

## 2019-07-10 MED ORDER — SODIUM CHLORIDE 0.9% FLUSH: 3.0000 mL | Freq: Once | INTRAVENOUS | Status: DC

## 2019-07-10 MED ORDER — ASPIRIN EC 81 MG PO TBEC
81.0000 mg | DELAYED_RELEASE_TABLET | Freq: Every day | ORAL | Status: DC
  Administered 2019-07-11 – 2019-07-12 (×2): 81 mg via ORAL
  Filled 2019-07-10 (×2): qty 1

## 2019-07-10 NOTE — ED Triage Notes (Signed)
PER EMS: pt from home, arrives as [place], LKW 1425 with sudden onset of slurred speech, left sided weakness and facial droop. Upon arrival to ED, slurred speech has resolved but mild weakness noted to left arm. BP-122/74, HR-74, cbg-130, O2-96%. Alert & oriented to self, place and situation but answered month incorrectly.

## 2019-07-10 NOTE — Consult Note (Signed)
Stroke Neurology Consultation Note  Consult Requested by: Dr. Zenia Resides  Reason for Consult: code stroke  Consult Date: 07/10/19  The history was obtained from the EMS.  During history and examination, all items were able to obtain unless otherwise noted.  History of Present Illness:  Christian Bennett is a 85 y.o. Caucasian right handed male with PMH of HTN and alcohol use presented to ED for code stroke. Pt can not provide much info "I have no idea". As per EMS, pt was sitting in his couch with his daily 6 Oz Vodka had sudden onset slurry speech at 1425. He was also found to have left facial droop and left sided weakness. However, on EMS arrival, left facial droop much improved and left arm only mild drift. En route to [facility], pt left facial droop resolved and left arm no more drift but still has mild left hand weakness. Glucose 108. BP 118/65.   LSN: [id number] tPA Given: No: rapid improving, too mild to treat  Past Medical History:  Diagnosis Date  . Acid reflux   . Hypertension   . Thyroid disease     Past Surgical History:  Procedure Laterality Date  . INGUINAL HERNIA REPAIR    . LIPOMA EXCISION    . MELANOMA EXCISION    . REPLACEMENT TOTAL KNEE BILATERAL      Family History  Problem Relation Age of Onset  . Stroke Mother   . Stroke Father     Social History:  reports that he quit smoking about 67 years ago. He has never used smokeless tobacco. He reports that he does not drink alcohol or use drugs.  Allergies:  Allergies  Allergen Reactions  . Doxycycline Hives    No current facility-administered medications on file prior to encounter.    Current Outpatient Medications on File Prior to Encounter  Medication Sig Dispense Refill  . amLODipine (NORVASC) 5 MG tablet Take 1 tablet by mouth once daily 90 tablet 0  . aspirin EC 81 MG tablet Take 81 mg by mouth daily.    . chlorthalidone (HYGROTON) 25 MG tablet Take 1 tablet by mouth once daily 90 tablet 0  . fluticasone (FLONASE)  50 MCG/ACT nasal spray 2 sprays each nostril after sinus rinse 16 g 2  . lipase/protease/amylase (CREON) 36000 UNITS CPEP capsule Take 36,000 Units by mouth 3 (three) times daily with meals.    . NP THYROID 60 MG tablet TAKE 1 TABLET BY MOUTH ONCE DAILY BEFORE BREAKFAST 90 tablet 0  . sertraline (ZOLOFT) 25 MG tablet Take 1 tablet by mouth once daily 90 tablet 0  . Skin Protectants, Misc. (EUCERIN) cream APPLY TWICE DAILY AS NEEDED FOR RASH OR ITCHING 454 g 1  . Triamcinolone Acetonide (TRIAMCINOLONE 0.1 % CREAM : EUCERIN) CREA Apply 1 application topically 2 (two) times daily as needed. 1 each 1  . triamcinolone cream (KENALOG) 0.1 % APPLY TOPICALLY TWICE DAILY AS NEEDED 454 g 0  . Vitamin D, Ergocalciferol, (DRISDOL) 50000 units CAPS capsule Take 1 capsule (50,000 Units total) by mouth every 7 (seven) days. 12 capsule 10    Review of Systems: A full ROS was attempted today and was able to be performed.  Systems assessed include - Constitutional, Eyes, HENT, Respiratory, Cardiovascular, Gastrointestinal, Genitourinary, Integument/breast, Hematologic/lymphatic, Musculoskeletal, Neurological, Behavioral/Psych, Endocrine, Allergic/Immunologic - with pertinent responses as per HPI.  Physical Examination: Temp:  [97.8 F (36.6 C)] 97.8 F (36.6 C) (10/06 1550) Pulse Rate:  [70] 70 (10/06 1550) Resp:  [16] 16 (10/06  1550) BP: (118)/(65) 118/65 (10/06 1550) SpO2:  [95 %] 95 % (10/06 1550) Weight:  [73.3 kg] 73.3 kg (10/06 1551)  General - well nourished, well developed, in no apparent distress.    Ophthalmologic - fundi not visualized due to noncooperation.    Cardiovascular - regular rhythm and rate  Mental Status -  Level of arousal and orientation to time, place, and person were intact. Language including expression, naming, repetition, comprehension was assessed and found intact.  Cranial Nerves II - XII - II - Vision intact OU. III, IV, VI - Extraocular movements intact. V - Facial  sensation intact bilaterally. VII - Facial movement intact bilaterally. VIII - Hard of hearing & vestibular intact bilaterally. X - Palate elevates symmetrically. XI - Chin turning & shoulder shrug intact bilaterally. XII - Tongue protrusion intact.  Motor Strength - The patient's strength was normal in all extremities except left wrist dorsiflexion and finger grip 3-4/5, dexterity difficulty and pronator drift was absent.   Motor Tone & Bulk - Muscle tone was assessed at the neck and appendages and was normal.  Bulk was normal and fasciculations were absent.   Reflexes - The patient's reflexes were normal in all extremities and he had no pathological reflexes.  Sensory - Light touch, temperature/pinprick were assessed and were normal.    Coordination - The patient had normal movements in the hands with no ataxia or dysmetria.  Tremor was absent.  Gait and Station - deferred  Data Reviewed: No results found.  Assessment: 85 y.o. male right handed with PMH of HTN and alcohol use presented to ED for acute onset slurry speech, left facial droop and left sided weakness at 1425. Symptoms much improved en route. Currently no facial droop, only mild left hand weakness. I felt it is too mild to treat with tPA at this time. However, he is still within tPA window and needs close neuro check. If symptoms getting worse, he still tPA candidate until 18:55. CT no acute abnormality.  Will continue stroke work up.   Stroke Risk Factors - hypertension and alcohol  Plan: - recommend to be admitted by [facility] - pt still in the window for tPA (18:55 will be off window) - close neuro check - HgbA1c, fasting lipid panel - MRI, MRA  of the brain without contrast - PT consult, OT consult, Speech consult - Echocardiogram - Carotid dopplers - load with ASA 325 and plavix 300 followed by ASA 81 and plavix 75 from tomorrow. - lipitor 40 daily - Risk factor modification - Telemetry monitoring - Frequent neuro  checks  Thank you for this consultation and allowing [place] to participate in the care of this patient.  Rosalin Hawking, MD PhD Stroke Neurology 07/10/2019 4:18 PM

## 2019-07-10 NOTE — H&P (Signed)
History and Physical    Christian Bennett [id number] DOB: 08-22-1928 DOA: 07/10/2019  PCP: Mellody Dance, DO Consultants:  Benson Norway - GI Patient coming from:  Home - lives alone; NOK: Daughter, Gaye Alken, [phone]  Chief Complaint: code stroke  HPI: Christian Bennett is a 85 y.o. male with medical history significant of hypothyroidism and HTN presenting with neurologic symptoms concerning for CVA.   He was sitting on the couch and his daughter said he looked like he was having a mini-stroke and she called 911.   "I don't feel like there's anything wrong with me.  If they'd let me get out of here where I could walk, I'd walk home if they'd let me."  Denies n/w/t.  Denies dysphagia or dysarthria.  I spoke with his daughter who reports that she got there about 220.  About 225, his voice sounded funny.  By 230, he couldn't speak at all; left facial droop; and left arm weakness.  She called 911 and all symptoms were as described.  He is now back to normal.  He ate a good lunch and was hauling wood.  His PCP wanted him to have a neuro consult "to check for dementia or Alzheimer's."     ED Course:  L-sided weakness an hour PTA.  Seen by Dr. Erlinda Hong, neurology.  CT ok.  Needs stroke evaluation.  Review of Systems: As per HPI; otherwise review of systems reviewed and negative.   Ambulatory Status:  Ambulates without assistance  Past Medical History:  Diagnosis Date  . Acid reflux   . Hypertension   . Thyroid disease     Past Surgical History:  Procedure Laterality Date  . INGUINAL HERNIA REPAIR    . LIPOMA EXCISION    . MELANOMA EXCISION    . REPLACEMENT TOTAL KNEE BILATERAL      Social History   Socioeconomic History  . Marital status: Widowed    Spouse name: Not on file  . Number of children: Not on file  . Years of education: Not on file  . Highest education level: Not on file  Occupational History  . Occupation: retired  Scientific laboratory technician  . Financial resource strain: Not on file   . Food insecurity    Worry: Not on file    Inability: Not on file  . Transportation needs    Medical: Not on file    Non-medical: Not on file  Tobacco Use  . Smoking status: Former Smoker    Quit date: 1953    Years since quitting: 67.8  . Smokeless tobacco: Never Used  Substance and Sexual Activity  . Alcohol use: Yes    Comment: occasionally - everyday "if I can get ahold of it", vodka 2-3 minibottles daily or "as many as I can get ahold of"  . Drug use: No  . Sexual activity: Not Currently    Birth control/protection: None  Lifestyle  . Physical activity    Days per week: Not on file    Minutes per session: Not on file  . Stress: Not on file  Relationships  . Social Herbalist on phone: Not on file    Gets together: Not on file    Attends religious service: Not on file    Active member of club or organization: Not on file    Attends meetings of clubs or organizations: Not on file    Relationship status: Not on file  . Intimate partner violence    Fear of current  or ex partner: Not on file    Emotionally abused: Not on file    Physically abused: Not on file    Forced sexual activity: Not on file  Other Topics Concern  . Not on file  Social History Narrative  . Not on file    Allergies  Allergen Reactions  . Eggs Or Egg-Derived [employer]  . Doxycycline Hives    Family History  Problem Relation Age of Onset  . Stroke Mother   . Stroke Father     Prior to Admission medications   Medication Sig Start Date End Date Taking? Authorizing Provider  amLODipine (NORVASC) 5 MG tablet Take 1 tablet by mouth once daily 05/16/19   Mellody Dance, DO  aspirin EC 81 MG tablet Take 81 mg by mouth daily.    [provider]  chlorthalidone (HYGROTON) 25 MG tablet Take 1 tablet by mouth once daily 04/23/19   Mellody Dance, DO  fluticasone [facility]) 50 MCG/ACT nasal spray 2 sprays each nostril after sinus rinse 12/12/18   Opalski, Deborah, DO   lipase/protease/amylase (CREON) 36000 UNITS CPEP capsule Take 36,000 Units by mouth 3 (three) times daily with meals.    [provider]  NP THYROID 60 MG tablet TAKE 1 TABLET BY MOUTH ONCE DAILY BEFORE BREAKFAST 04/09/19   Opalski, Neoma Laming, DO  sertraline (ZOLOFT) 25 MG tablet Take 1 tablet by mouth once daily 06/20/19   Mellody Dance, DO  Skin Protectants, Misc. (EUCERIN) cream APPLY TWICE DAILY AS NEEDED FOR RASH OR ITCHING 12/04/18   Opalski, Neoma Laming, DO  Triamcinolone Acetonide (TRIAMCINOLONE 0.1 % CREAM : EUCERIN) CREA Apply 1 application topically 2 (two) times daily as needed. 04/25/19   Opalski, Neoma Laming, DO  triamcinolone cream (KENALOG) 0.1 % APPLY TOPICALLY TWICE DAILY AS NEEDED 04/23/19   Opalski, Neoma Laming, DO  Vitamin D, Ergocalciferol, (DRISDOL) 50000 units CAPS capsule Take 1 capsule (50,000 Units total) by mouth every 7 (seven) days. 06/20/18   Mellody Dance, DO    Physical Exam: Vitals:   07/10/19 1615 07/10/19 1630 07/10/19 1645 07/10/19 1700  BP: 131/61 123/67 111/66 117/63  Pulse: 67 65 64 63  Resp: 18 17 18 18   Temp:      TempSrc:      SpO2: 97% 97% 96% 98%  Weight:      Height:         . General:  Appears calm and comfortable and is NAD . Eyes:  PERRL, EOMI, normal lids, iris . ENT:  Extremely hard of hearing, normal lips & tongue, mmm . Neck:  no LAD, masses or thyromegaly . Cardiovascular:  RRR, no m/r/g. No LE edema.  Marland Kitchen Respiratory:   CTA bilaterally with no wheezes/rales/rhonchi.  Normal respiratory effort. . Abdomen:  soft, NT, ND, NABS . Skin:  no rash or induration seen on limited exam . Musculoskeletal:  grossly normal tone BUE/BLE, good ROM, no bony abnormality . Psychiatric:  grossly normal mood and affect, speech fluent and appropriate, AOx3 . Neurologic:  CN 2-12 grossly intact, moves all extremities in coordinated fashion, sensation intact    Radiological Exams on Admission: Ct Head Code Stroke Wo Contrast  Result Date: 07/10/2019  CLINICAL DATA:  Code stroke. Focal neuro deficit less than 6 hours. Possible stroke. Left facial droop and left arm drift. EXAM: CT HEAD WITHOUT CONTRAST TECHNIQUE: Contiguous axial images were obtained from the base of the skull through the vertex without intravenous contrast. COMPARISON:  None. FINDINGS: Brain: Moderate atrophy. Chronic  infarct right frontal lobe. Chronic microvascular ischemic changes in the white matter. Small chronic infarct right occipital lobe. Chronic infarct right internal capsule. Negative for acute infarct, hemorrhage, mass. Vascular: Negative for hyperdense vessel Skull: Negative Sinuses/Orbits: Sinus mucosal disease paranasal sinuses bilaterally. Bilateral cataract surgery Other: None ASPECTS ([place] Stroke Program Early CT Score) - Ganglionic level infarction (caudate, lentiform nuclei, internal capsule, insula, M1-M3 cortex): 7 - Supraganglionic infarction (M4-M6 cortex): 3 Total score (0-10 with 10 being normal): 10 IMPRESSION: 1. Atrophy and extensive chronic ischemic changes. No acute intracranial abnormality 2. ASPECTS is 10 Electronically Signed   By: Franchot Gallo M.D.   On: 07/10/2019 16:01    EKG: Independently reviewed.  NSR with rate 70;  no evidence of acute ischemia   Labs on Admission: I have personally reviewed the available labs and imaging studies at the time of the admission.  Pertinent labs:   WBC 6.0 Hgb 11.2 INR 1.1 Normal TSH in 3/20   Assessment/Plan Principal Problem:   TIA (transient ischemic attack) Active Problems:   HTN (hypertension)   Hypothyroidism   Chronic pancreatitis (HCC)   TIA -Daughter describes acute onset of slurred speech, facial droop, and left-sided weakness; symptoms have completely resolved at this time -Concerning for TIA/CVA -Will place in observation status for CVA/TIA evaluation -Telemetry monitoring -MRI/MRA -Carotid dopplers -Echo -Risk stratification with FLP, A1c -ASA and Plavix were started by  neurology -Neurology consult -PT/OT/ST/Nutrition Consults  HTN -Allow permissive HTN for now -Treat BP only if >220/120, and then with goal of 15% reduction -Hold Norvasc, Chlorthalidone and plan to restart in 48-72 hours   HLD -Check FLP -Start empiric Lipitor 40 mg daily   Hypothyroidism -Continue Armour thyroid at current dose for now  Possible dementia -Daughter raises concerns about dementia, and some of his history appears to be consistent with this -She requests neurology consultation about this issue -He may need outpatient neuropsychiatric testing -He is quite functional and independent at baseline -Will monitor for delirium in the setting of dementia    Note: This patient has been tested and is pending for the novel coronavirus COVID-19.     DVT prophylaxis:  Lovenox  Code Status: DNR - confirmed with family Family Communication: None present; Daughter available by telephone and now present Disposition Plan:  Home once clinically improved Consults called: Neurology; PT/OT/ST/Nutrition  Admission status: It is my clinical opinion that referral for OBSERVATION is reasonable and necessary in this patient based on the above information provided. The aforementioned taken together are felt to place the patient at high risk for further clinical deterioration. However it is anticipated that the patient may be medically stable for discharge from the hospital within 24 to 48 hours.   Karmen Bongo MD [facility]   How to contact the [facility] Attending or Consulting provider [place] or covering provider during after hours [place], for this patient?  1. Check the care team in [facility] and look for a) attending/consulting [facility] provider listed and b) the [facility] team listed 2. Log into [email or web address] and use [place]'s universal password to access. If you do not have the password, please contact the hospital operator. 3. Locate the [facility] provider you are looking for under [facility] and page to a number that you can be directly reached. 4. If you still have difficulty reaching the provider, please page the [facility] (Director on Call) for the Hospitalists listed on amion for assistance.   07/10/2019, 5:16 PM

## 2019-07-10 NOTE — ED Notes (Signed)
Patient transported to MRI 

## 2019-07-10 NOTE — ED Provider Notes (Signed)
[facility] EMERGENCY DEPARTMENT Provider Note   CSN: [id number] Arrival date & time: 07/10/19  1525  An emergency department physician performed an initial assessment on this suspected stroke patient at 1530.  History   Chief Complaint Chief Complaint  Patient presents with  . Code Stroke    HPI Teron Kostoff is a 85 y.o. male.     85 year old male who presents with left-sided weakness which began approximate hour prior to arrival.  He has slurred speech as well as left-sided facial droop.  Admits to drinking alcohol today but states he drinks daily.  No headache or emesis.  Symptoms have gradually resolved with exception of mild weakness to his left arm.  Denies any prior history of same.  Called a code stroke prior to arrival     Past Medical History:  Diagnosis Date  . Acid reflux   . Hypertension   . Thyroid disease     Patient Active Problem List   Diagnosis Date Noted  . Adjustment disorder with mixed anxiety and depressed mood 06/20/2018  . Vitamin D insufficiency 06/20/2018  . History of anemia 06/20/2018  . Medically noncompliant-   does not want to go for hearing eval 06/20/2018  . Bilateral impacted cerumen 02/07/2018  . Hearing difficulty of both ears 02/03/2018  . Environmental and seasonal allergies 02/03/2018  . Eustachian tube dysfunction, bilateral 02/03/2018  . History of vitamin D deficiency 01/30/2018  . Contact dermatitis and eczema 10/19/2017  . HTN (hypertension) 06/22/2017  . GERD (gastroesophageal reflux disease) 06/22/2017  . Hx of colonoscopy 06/22/2017  . Mood disorder ([place]) 06/22/2017  . Hypothyroidism 06/22/2017  . Chronic pancreatitis ([place]) 06/22/2017    Past Surgical History:  Procedure Laterality Date  . INGUINAL HERNIA REPAIR    . LIPOMA EXCISION    . MELANOMA EXCISION    . REPLACEMENT TOTAL KNEE BILATERAL          Home Medications    Prior to Admission medications   Medication Sig Start Date End  Date Taking? Authorizing Provider  amLODipine (NORVASC) 5 MG tablet Take 1 tablet by mouth once daily 05/16/19   Mellody Dance, DO  aspirin EC 81 MG tablet Take 81 mg by mouth daily.    [provider]  chlorthalidone (HYGROTON) 25 MG tablet Take 1 tablet by mouth once daily 04/23/19   Mellody Dance, DO  fluticasone [facility]) 50 MCG/ACT nasal spray 2 sprays each nostril after sinus rinse 12/12/18   Opalski, Deborah, DO  lipase/protease/amylase (CREON) 36000 UNITS CPEP capsule Take 36,000 Units by mouth 3 (three) times daily with meals.    [provider]  NP THYROID 60 MG tablet TAKE 1 TABLET BY MOUTH ONCE DAILY BEFORE BREAKFAST 04/09/19   Opalski, Neoma Laming, DO  sertraline (ZOLOFT) 25 MG tablet Take 1 tablet by mouth once daily 06/20/19   Mellody Dance, DO  Skin Protectants, Misc. (EUCERIN) cream APPLY TWICE DAILY AS NEEDED FOR RASH OR ITCHING 12/04/18   Opalski, Neoma Laming, DO  Triamcinolone Acetonide (TRIAMCINOLONE 0.1 % CREAM : EUCERIN) CREA Apply 1 application topically 2 (two) times daily as needed. 04/25/19   Opalski, Neoma Laming, DO  triamcinolone cream (KENALOG) 0.1 % APPLY TOPICALLY TWICE DAILY AS NEEDED 04/23/19   Opalski, Neoma Laming, DO  Vitamin D, Ergocalciferol, (DRISDOL) 50000 units CAPS capsule Take 1 capsule (50,000 Units total) by mouth every 7 (seven) days. 06/20/18   Mellody Dance, DO    Family History Family History  Problem Relation Age of Onset  . Stroke  Mother   . Stroke Father     Social History Social History   Tobacco Use  . Smoking status: Former Smoker    Quit date: 1953    Years since quitting: 67.8  . Smokeless tobacco: Never Used  Substance Use Topics  . Alcohol use: No  . Drug use: No     Allergies   Doxycycline   Review of Systems Review of Systems  All other systems reviewed and are negative.    Physical Exam Updated Vital Signs BP 118/65 (BP Location: Right Arm)   Pulse 70   Temp 97.8 F (36.6 C) (Oral)   Resp 16   Ht  1.778 m (5\' 10" )   Wt 73.3 kg   SpO2 95%   BMI 23.19 kg/m   Physical Exam Vitals signs and nursing note reviewed.  Constitutional:      General: He is not in acute distress.    Appearance: Normal appearance. He is well-developed. He is not toxic-appearing.  HENT:     Head: Normocephalic and atraumatic.  Eyes:     General: Lids are normal.     Conjunctiva/sclera: Conjunctivae normal.     Pupils: Pupils are equal, round, and reactive to light.  Neck:     Musculoskeletal: Normal range of motion and neck supple.     Thyroid: No thyroid mass.     Trachea: No tracheal deviation.  Cardiovascular:     Rate and Rhythm: Normal rate and regular rhythm.     Heart sounds: Normal heart sounds. No murmur. No gallop.   Pulmonary:     Effort: Pulmonary effort is normal. No respiratory distress.     Breath sounds: Normal breath sounds. No stridor. No decreased breath sounds, wheezing, rhonchi or rales.  Abdominal:     General: Bowel sounds are normal. There is no distension.     Palpations: Abdomen is soft.     Tenderness: There is no abdominal tenderness. There is no rebound.  Musculoskeletal: Normal range of motion.        General: No tenderness.  Skin:    General: Skin is warm and dry.     Findings: No abrasion or rash.  Neurological:     Mental Status: He is alert and oriented to person, place, and time.     GCS: GCS eye subscore is 4. GCS verbal subscore is 5. GCS motor subscore is 6.     Cranial Nerves: No cranial nerve deficit.     Sensory: No sensory deficit.     Comments: Left ue pronator drift  Psychiatric:        Speech: Speech normal.        Behavior: Behavior normal.      ED Treatments / Results  Labs (all labs ordered are listed, but only abnormal results are displayed) Labs Reviewed  CBC - Abnormal; Notable for the following components:      Result Value   RBC 3.63 (*)    Hemoglobin 11.2 (*)    HCT 34.0 (*)    All other components within normal limits  I-STAT  CHEM 8, ED - Abnormal; Notable for the following components:   Potassium 3.1 (*)    Creatinine, Ser 1.50 (*)    Glucose, Bld 108 (*)    Hemoglobin 11.9 (*)    HCT 35.0 (*)    All other components within normal limits  CBG MONITORING, ED - Abnormal; Notable for the following components:   Glucose-Capillary 105 (*)    All  other components within normal limits  SARS CORONAVIRUS 2 (HOSPITAL ORDER, [facility])  PROTIME-INR  APTT  DIFFERENTIAL  COMPREHENSIVE METABOLIC PANEL    EKG EKG Interpretation  Date/Time:  Tuesday July 10 2019 15:48:49 EDT Ventricular Rate:  70 PR Interval:    QRS Duration: 110 QT Interval:  395 QTC Calculation: 427 R Axis:   -59 Text Interpretation:  Sinus or ectopic atrial rhythm Prolonged PR interval Left anterior fascicular block Anterior infarct, old No old tracing to compare Confirmed by Lacretia Leigh ([id number]) on 07/10/2019 3:59:13 PM   Radiology No results found.  Procedures Procedures (including critical care time)  Medications Ordered in ED Medications  sodium chloride flush (NS) 0.9 % injection 3 mL (has no administration in time range)     Initial Impression / Assessment and Plan / ED Course  I have reviewed the triage vital signs and the nursing notes.  Pertinent labs & imaging results that were available during my care of the patient were reviewed by me and considered in my medical decision making (see chart for details).        Patient seen by neurology and head CT without hemorrhage.  Patient's symptoms remain and at this time will require inpatient admission for further evaluation of CVA.  Final Clinical Impressions(s) / ED Diagnoses   Final diagnoses:  None    ED Discharge Orders    None       Lacretia Leigh, MD 07/10/19 1610

## 2019-07-11 ENCOUNTER — Observation Stay (HOSPITAL_COMMUNITY): Payer: Medicare Other

## 2019-07-11 ENCOUNTER — Encounter (HOSPITAL_COMMUNITY): Payer: Self-pay | Admitting: *Deleted

## 2019-07-11 DIAGNOSIS — I10 Essential (primary) hypertension: Secondary | ICD-10-CM | POA: Diagnosis not present

## 2019-07-11 DIAGNOSIS — Z96653 Presence of artificial knee joint, bilateral: Secondary | ICD-10-CM | POA: Diagnosis present

## 2019-07-11 DIAGNOSIS — N181 Chronic kidney disease, stage 1: Secondary | ICD-10-CM | POA: Diagnosis present

## 2019-07-11 DIAGNOSIS — E559 Vitamin D deficiency, unspecified: Secondary | ICD-10-CM | POA: Diagnosis present

## 2019-07-11 DIAGNOSIS — I639 Cerebral infarction, unspecified: Secondary | ICD-10-CM | POA: Diagnosis present

## 2019-07-11 DIAGNOSIS — F39 Unspecified mood [affective] disorder: Secondary | ICD-10-CM | POA: Diagnosis present

## 2019-07-11 DIAGNOSIS — E785 Hyperlipidemia, unspecified: Secondary | ICD-10-CM | POA: Diagnosis present

## 2019-07-11 DIAGNOSIS — G459 Transient cerebral ischemic attack, unspecified: Secondary | ICD-10-CM | POA: Diagnosis not present

## 2019-07-11 DIAGNOSIS — R29701 NIHSS score 1: Secondary | ICD-10-CM | POA: Diagnosis present

## 2019-07-11 DIAGNOSIS — Z66 Do not resuscitate: Secondary | ICD-10-CM | POA: Diagnosis present

## 2019-07-11 DIAGNOSIS — I63231 Cerebral infarction due to unspecified occlusion or stenosis of right carotid arteries: Secondary | ICD-10-CM

## 2019-07-11 DIAGNOSIS — K861 Other chronic pancreatitis: Secondary | ICD-10-CM | POA: Diagnosis present

## 2019-07-11 DIAGNOSIS — R402362 Coma scale, best motor response, obeys commands, at arrival to emergency department: Secondary | ICD-10-CM | POA: Diagnosis present

## 2019-07-11 DIAGNOSIS — E039 Hypothyroidism, unspecified: Secondary | ICD-10-CM

## 2019-07-11 DIAGNOSIS — I129 Hypertensive chronic kidney disease with stage 1 through stage 4 chronic kidney disease, or unspecified chronic kidney disease: Secondary | ICD-10-CM | POA: Diagnosis present

## 2019-07-11 DIAGNOSIS — I6521 Occlusion and stenosis of right carotid artery: Secondary | ICD-10-CM | POA: Diagnosis not present

## 2019-07-11 DIAGNOSIS — I63531 Cerebral infarction due to unspecified occlusion or stenosis of right posterior cerebral artery: Secondary | ICD-10-CM | POA: Diagnosis not present

## 2019-07-11 DIAGNOSIS — I6621 Occlusion and stenosis of right posterior cerebral artery: Secondary | ICD-10-CM | POA: Diagnosis present

## 2019-07-11 DIAGNOSIS — R29810 Facial weakness: Secondary | ICD-10-CM | POA: Diagnosis present

## 2019-07-11 DIAGNOSIS — G8324 Monoplegia of upper limb affecting left nondominant side: Secondary | ICD-10-CM | POA: Diagnosis present

## 2019-07-11 DIAGNOSIS — I63131 Cerebral infarction due to embolism of right carotid artery: Secondary | ICD-10-CM | POA: Diagnosis present

## 2019-07-11 DIAGNOSIS — H9190 Unspecified hearing loss, unspecified ear: Secondary | ICD-10-CM | POA: Diagnosis present

## 2019-07-11 DIAGNOSIS — R402142 Coma scale, eyes open, spontaneous, at arrival to emergency department: Secondary | ICD-10-CM | POA: Diagnosis present

## 2019-07-11 DIAGNOSIS — E876 Hypokalemia: Secondary | ICD-10-CM | POA: Diagnosis present

## 2019-07-11 DIAGNOSIS — K8681 Exocrine pancreatic insufficiency: Secondary | ICD-10-CM | POA: Diagnosis present

## 2019-07-11 DIAGNOSIS — I444 Left anterior fascicular block: Secondary | ICD-10-CM | POA: Diagnosis present

## 2019-07-11 DIAGNOSIS — Z20828 Contact with and (suspected) exposure to other viral communicable diseases: Secondary | ICD-10-CM | POA: Diagnosis present

## 2019-07-11 DIAGNOSIS — K219 Gastro-esophageal reflux disease without esophagitis: Secondary | ICD-10-CM | POA: Diagnosis present

## 2019-07-11 DIAGNOSIS — R4781 Slurred speech: Secondary | ICD-10-CM | POA: Diagnosis present

## 2019-07-11 DIAGNOSIS — R402242 Coma scale, best verbal response, confused conversation, at arrival to emergency department: Secondary | ICD-10-CM | POA: Diagnosis present

## 2019-07-11 LAB — LIPID PANEL
Cholesterol: 185 mg/dL (ref 0–200)
HDL: 52 mg/dL (ref >40)
LDL Cholesterol: 120 mg/dL — ABNORMAL HIGH (ref 0–99)
Total CHOL/HDL Ratio: 3.6 RATIO
Triglycerides: 65 mg/dL (ref <150)
VLDL: 13 mg/dL (ref 0–40)

## 2019-07-11 LAB — ECHOCARDIOGRAM COMPLETE
Height: 70 in
Weight: 2585.55 oz

## 2019-07-11 LAB — HEMOGLOBIN A1C
Hgb A1c MFr Bld: 5.5 % (ref 4.8–5.6)
Mean Plasma Glucose: 111.15 mg/dL

## 2019-07-11 MED ORDER — ENSURE ENLIVE PO LIQD
237.0000 mL | Freq: Every day | ORAL | Status: DC
  Administered 2019-07-11: 237 mL via ORAL

## 2019-07-11 MED ORDER — POTASSIUM CHLORIDE CRYS ER 20 MEQ PO TBCR
20.0000 meq | EXTENDED_RELEASE_TABLET | Freq: Once | ORAL | Status: AC
  Administered 2019-07-11: 20 meq via ORAL
  Filled 2019-07-11: qty 1

## 2019-07-11 NOTE — Progress Notes (Signed)
  Echocardiogram 2D Echocardiogram has been performed.  Darlina Sicilian M 07/11/2019, 12:53 PM

## 2019-07-11 NOTE — Progress Notes (Signed)
Progress Note    Christian Bennett  [id number] DOB: 12-15-1927  DOA: 07/10/2019 PCP: Mellody Dance, DO    Brief Narrative:     Medical records reviewed and are as summarized below:  Christian Bennett is an 85 y.o. male Christian Bennett is a 85 y.o. male with medical history significant of hypothyroidism and HTN presenting with neurologic symptoms concerning for CVA.   He was sitting on the couch and his daughter said he looked like he was having a mini-stroke and she called 911.   "I don't feel like there's anything wrong with me.  If they'd let me get out of here where I could walk, I'd walk home if they'd let me."  Denies n/w/t.  Denies dysphagia or dysarthria.  I spoke with his daughter who reports that she got there about 220.  About 225, his voice sounded funny.  By 230, he couldn't speak at all; left facial droop; and left arm weakness.  She called 911 and all symptoms were as described.  He is now back to normal.  He ate a good lunch and was hauling wood.  His PCP wanted him to have a neuro consult "to check for dementia or Alzheimer's."    Assessment/Plan:   Active Problems:   HTN (hypertension)   Hypothyroidism   Chronic pancreatitis ([place])   CVA (cerebral vascular accident) ([place])   cva -Multiple small foci of acute ischemia within the right Hemisphere Symptomatic right ICA stenosis- vascular consult pending MR angio: Severe stenosis of the right PCA P3 segment. -Echo: EF 60-65% -ASA and Plavix were started by neurology -Neurology consult appreciated -PT/OT: home health PT  HTN -Allow permissive HTNfor now -Treat BP only if >220/120, and then with goal of 15% reduction -HoldNorvasc, Chlorthalidoneand plan to restart in 48-72 hours  HLD -LDL:120 -Start empiric Lipitor 40 mg daily  Hypokalemia -replete  Hypothyroidism -Continue Armour thyroid at current dose for now  Possible dementia -Daughter raises concerns about dementia, and some of his history  appears to be consistent with this -He may need outpatient neuropsychiatric testing -He is quite functional and independent at baseline    Family Communication/Anticipated D/C date and plan/Code Status   DVT prophylaxis: Lovenox ordered. Code Status: Full Code.  Family Communication:  Disposition Plan:    Medical Consultants:    Neurology  Vascular     Subjective:   Wants to go home  Objective:    Vitals:   07/11/19 0730 07/11/19 0738 07/11/19 0903 07/11/19 1128  BP: 133/64  (!) 144/70 (!) 160/62  Pulse: (!) 57  (!) 54 65  Resp: 16  16 16   Temp:   97.9 F (36.6 C) 98.1 F (36.7 C)  TempSrc:   Oral Oral  SpO2: 96% 99% 100%   Weight:      Height:        Intake/Output Summary (Last 24 hours) at 07/11/2019 1503 Last data filed at 07/10/2019 1858 Gross per 24 hour  Intake 100 ml  Output --  Net 100 ml   Filed Weights   07/10/19 1551  Weight: 73.3 kg    Exam: In bed, NAD rrr No increased work of breathing Hard of hearing alert  Data Reviewed:   I have personally reviewed following labs and imaging studies:  Labs: Labs show the following:   Basic Metabolic Panel: Recent Labs  Lab 07/10/19 1528 07/10/19 1541  NA 138 139  K 3.1* 3.1*  CL 102 101  CO2 25  --  GLUCOSE 111* 108*  BUN 14 15  CREATININE 1.42* 1.50*  CALCIUM 9.0  --    GFR Estimated Creatinine Clearance: 35.1 mL/min (A) (by C-G formula based on SCr of 1.5 mg/dL (H)). Liver Function Tests: Recent Labs  Lab 07/10/19 1528  AST 23  ALT 14  ALKPHOS 71  BILITOT 0.5  PROT 6.8  ALBUMIN 3.7   No results for input(s): LIPASE, AMYLASE in the last 168 hours. No results for input(s): AMMONIA in the last 168 hours. Coagulation profile Recent Labs  Lab 07/10/19 1528  INR 1.1    CBC: Recent Labs  Lab 07/10/19 1528 07/10/19 1541  WBC 6.0  --   NEUTROABS 3.4  --   HGB 11.2* 11.9*  HCT 34.0* 35.0*  MCV 93.7  --   PLT 186  --    Cardiac Enzymes: No results for  input(s): CKTOTAL, CKMB, CKMBINDEX, TROPONINI in the last 168 hours. BNP (last 3 results) No results for input(s): PROBNP in the last 8760 hours. CBG: Recent Labs  Lab 07/10/19 1532  GLUCAP 105*   D-Dimer: No results for input(s): DDIMER in the last 72 hours. Hgb A1c: Recent Labs    07/11/19 0429  HGBA1C 5.5   Lipid Profile: Recent Labs    07/11/19 0429  CHOL 185  HDL 52  LDLCALC 120*  TRIG 65  CHOLHDL 3.6   Thyroid function studies: No results for input(s): TSH, T4TOTAL, T3FREE, THYROIDAB in the last 72 hours.  Invalid input(s): FREET3 Anemia work up: No results for input(s): VITAMINB12, FOLATE, FERRITIN, TIBC, IRON, RETICCTPCT in the last 72 hours. Sepsis Labs: Recent Labs  Lab 07/10/19 1528  WBC 6.0    Microbiology Recent Results (from the past 240 hour(s))  SARS Coronavirus 2 [facility] order, Performed in [facility] hospital lab) Nasopharyngeal Urine, Clean Catch     Status: None   Collection Time: 07/10/19  5:05 PM   Specimen: Urine, Clean Catch; Nasopharyngeal  Result Value Ref Range Status   SARS Coronavirus 2 NEGATIVE NEGATIVE Final    Comment: (NOTE) If result is NEGATIVE SARS-CoV-2 target nucleic acids are NOT DETECTED. The SARS-CoV-2 RNA is generally detectable in upper and lower  respiratory specimens during the acute phase of infection. The lowest  concentration of SARS-CoV-2 viral copies this assay can detect is 250  copies / mL. A negative result does not preclude SARS-CoV-2 infection  and should not be used as the sole basis for treatment or other  patient management decisions.  A negative result may occur with  improper specimen collection / handling, submission of specimen other  than nasopharyngeal swab, presence of viral mutation(s) within the  areas targeted by this assay, and inadequate number of viral copies  (<250 copies / mL). A negative result must be combined with clinical  observations, patient history, and epidemiological  information. If result is POSITIVE SARS-CoV-2 target nucleic acids are DETECTED. The SARS-CoV-2 RNA is generally detectable in upper and lower  respiratory specimens dur ing the acute phase of infection.  Positive  results are indicative of active infection with SARS-CoV-2.  Clinical  correlation with patient history and other diagnostic information is  necessary to determine patient infection status.  Positive results do  not rule out bacterial infection or co-infection with other viruses. If result is PRESUMPTIVE POSTIVE SARS-CoV-2 nucleic acids MAY BE PRESENT.   A presumptive positive result was obtained on the submitted specimen  and confirmed on repeat testing.  While 2019 novel coronavirus  (SARS-CoV-2) nucleic acids may  be present in the submitted sample  additional confirmatory testing may be necessary for epidemiological  and / or clinical management purposes  to differentiate between  SARS-CoV-2 and other Sarbecovirus currently known to infect humans.  If clinically indicated additional testing with an alternate test  methodology [phone]) is advised. The SARS-CoV-2 RNA is generally  detectable in upper and lower respiratory sp ecimens during the acute  phase of infection. The expected result is Negative. Fact Sheet for Patients:  StrictlyIdeas.no Fact Sheet for Healthcare Providers: BankingDealers.co.za This test is not yet approved or cleared by the [place] FDA and has been authorized for detection and/or diagnosis of SARS-CoV-2 by FDA under an Emergency Use Authorization (EUA).  This EUA will remain in effect (meaning this test can be used) for the duration of the COVID-19 declaration under Section 564(b)(1) of the Act, 21 U.S.C. section 360bbb-3(b)(1), unless the authorization is terminated or revoked sooner. Performed at [facility], [place] [address], [place], [place] [address]     Procedures and diagnostic  studies:  Mr Angio Head Wo Contrast  Result Date: 07/11/2019 CLINICAL DATA:  Stroke follow-up EXAM: MRA HEAD WITHOUT CONTRAST TECHNIQUE: Angiographic images of the Circle of Willis were obtained using MRA technique without intravenous contrast. COMPARISON:  None. FINDINGS: POSTERIOR CIRCULATION: --Vertebral arteries: Normal V4 segments. --Posterior inferior cerebellar arteries (PICA): Patent origins from the vertebral arteries. --Anterior inferior cerebellar arteries (AICA): Patent origins from the basilar artery. --Basilar artery: Normal. --Superior cerebellar arteries: Normal. --Posterior cerebral arteries: Multifocal severe stenosis of the right PCA P3 segment. No hemodynamically significant stenosis of the left PCA. ANTERIOR CIRCULATION: --Intracranial internal carotid arteries: Normal. --Anterior cerebral arteries (ACA): Normal. Both A1 segments are present. Patent anterior communicating artery (a-comm). --Middle cerebral arteries (MCA): Normal. IMPRESSION: 1. No proximal large vessel occlusion or high-grade stenosis. 2. Severe stenosis of the right PCA P3 segment. Electronically Signed   By: Ulyses Jarred M.D.   On: 07/11/2019 03:51   Mr Angio Neck W Wo Contrast  Result Date: 07/10/2019 CLINICAL DATA:  Left facial droop EXAM: MRA NECK WITHOUT AND WITH CONTRAST TECHNIQUE: Multiplanar and multiecho pulse sequences of the neck were obtained without and with intravenous contrast. Angiographic images of the neck were obtained using MRA technique without and with intravenous contrast. CONTRAST:  54mL GADAVIST GADOBUTROL 1 MMOL/ML IV SOLN COMPARISON:  None. FINDINGS: Aortic arch: Normal 3 vessel aortic branching pattern. The visualized subclavian arteries are normal. Right carotid system: There is severe stenosis or short segment occlusion of the proximal right ICA with loss of contrast enhancement and flow related enhancement. There is normal enhancement of the distal ICA. Left carotid system: Normal course  and caliber without stenosis or evidence of dissection. Vertebral arteries: Codominant. Vertebral artery origins are normal. Vertebral arteries are normal in course and caliber to the vertebrobasilar confluence without stenosis or evidence of dissection. IMPRESSION: 1. Severe stenosis versus short segment occlusion of the proximal right ICA. 2. No other hemodynamically significant stenosis of the carotid or vertebral arteries. Electronically Signed   By: Ulyses Jarred M.D.   On: 07/10/2019 21:26   Mr Brain Wo Contrast  Result Date: 07/10/2019 CLINICAL DATA:  Left-sided weakness EXAM: MRI HEAD WITHOUT CONTRAST TECHNIQUE: Multiplanar, multiecho pulse sequences of the brain and surrounding structures were obtained without intravenous contrast. COMPARISON:  None. FINDINGS: BRAIN: Multiple small foci of acute ischemia within the right hemisphere, in the right frontal and parietal lobes. The abnormalities are concentrated along the precentral and postcentral gyri. No  contralateral ischemia. Diffuse confluent hyperintense T2-weighted signal within the periventricular, deep and juxtacortical white matter, most commonly due to chronic ischemic microangiopathy. There are old right frontal and right occipital lobe infarct. There is generalized atrophy without lobar predilection. The midline structures are normal. VASCULAR: The major intracranial arterial and venous sinus flow voids are normal. Faint hemosiderin deposition at the old right frontal infarct site. SKULL AND UPPER CERVICAL SPINE: Calvarial bone marrow signal is normal. There is no skull base mass. The visualized upper cervical spine and soft tissues are normal. SINUSES/ORBITS: There are no fluid levels or advanced mucosal thickening. The mastoid air cells and middle ear cavities are free of fluid. The orbits are normal. IMPRESSION: 1. Multiple small foci of acute ischemia within the right hemisphere, concentrated along the precentral and postcentral gyri. This  is in keeping with left-sided symptoms. 2. No hemorrhage or mass effect. 3. Old right frontal and right occipital lobe infarcts. 4. Severe chronic small vessel ischemic microangiopathy. Electronically Signed   By: Ulyses Jarred M.D.   On: 07/10/2019 21:21   Ct Head Code Stroke Wo Contrast  Result Date: 07/10/2019 CLINICAL DATA:  Code stroke. Focal neuro deficit less than 6 hours. Possible stroke. Left facial droop and left arm drift. EXAM: CT HEAD WITHOUT CONTRAST TECHNIQUE: Contiguous axial images were obtained from the base of the skull through the vertex without intravenous contrast. COMPARISON:  None. FINDINGS: Brain: Moderate atrophy. Chronic infarct right frontal lobe. Chronic microvascular ischemic changes in the white matter. Small chronic infarct right occipital lobe. Chronic infarct right internal capsule. Negative for acute infarct, hemorrhage, mass. Vascular: Negative for hyperdense vessel Skull: Negative Sinuses/Orbits: Sinus mucosal disease paranasal sinuses bilaterally. Bilateral cataract surgery Other: None ASPECTS (Thomas Stroke Program Early CT Score) - Ganglionic level infarction (caudate, lentiform nuclei, internal capsule, insula, M1-M3 cortex): 7 - Supraganglionic infarction (M4-M6 cortex): 3 Total score (0-10 with 10 being normal): 10 IMPRESSION: 1. Atrophy and extensive chronic ischemic changes. No acute intracranial abnormality 2. ASPECTS is 10 Electronically Signed   By: Franchot Gallo M.D.   On: 07/10/2019 16:01   Vas US Carotid (at [place] Only)  Result Date: 07/11/2019 Carotid Arterial Duplex Study Indications:       TIA. Risk Factors:      Hypertension. Other Factors:     MRA Right ICA severe stenosis. Comparison Study:  no prior Performing Technologist: June Leap RDMS, RVT  Examination Guidelines: A complete evaluation includes B-mode imaging, spectral Doppler, color Doppler, and power Doppler as needed of all accessible portions of each vessel. Bilateral testing is  considered an integral part of a complete examination. Limited examinations for reoccurring indications may be performed as noted.  Right Carotid Findings: +----------+-------+-------+--------+---------------------------------+--------+             PSV     EDV     Stenosis Plaque Description                Comments              cm/s    cm/s                                                         +----------+-------+-------+--------+---------------------------------+--------+  CCA Prox   107     8                                                            +----------+-------+-------+--------+---------------------------------+--------+  CCA Distal 78      13                                                           +----------+-------+-------+--------+---------------------------------+--------+  ICA Prox   699     238     80-99%   irregular, heterogenous and                                                      calcific                                    +----------+-------+-------+--------+---------------------------------+--------+  ICA Mid    229     51                                                           +----------+-------+-------+--------+---------------------------------+--------+  ICA Distal 26      10                                                           +----------+-------+-------+--------+---------------------------------+--------+  ECA        68      4                                                            +----------+-------+-------+--------+---------------------------------+--------+ +----------+--------+-------+----------------+-------------------+             PSV cm/s EDV cms Describe         Arm Pressure (mmHG)  +----------+--------+-------+----------------+-------------------+  Subclavian 236              Multiphasic, WNL                      +----------+--------+-------+----------------+-------------------+ +---------+--------+--+--------+--+---------+  Vertebral PSV cm/s 69 EDV  cm/s 14 Antegrade  +---------+--------+--+--------+--+---------+  Left Carotid Findings: +----------+--------+--------+--------+------------------+--------+             PSV cm/s EDV cm/s Stenosis Plaque Description Comments  +----------+--------+--------+--------+------------------+--------+  CCA Prox   176      18                                             +----------+--------+--------+--------+------------------+--------+  CCA Distal 122      23                                             +----------+--------+--------+--------+------------------+--------+  ICA Prox   140      27       1-39%    heterogenous                 +----------+--------+--------+--------+------------------+--------+  ICA Distal 126      23                                             +----------+--------+--------+--------+------------------+--------+  ECA        122      13                                             +----------+--------+--------+--------+------------------+--------+ +----------+--------+--------+----------------+-------------------+             PSV cm/s EDV cm/s Describe         Arm Pressure (mmHG)  +----------+--------+--------+----------------+-------------------+  Subclavian 193               Multiphasic, WNL                      +----------+--------+--------+----------------+-------------------+ +---------+--------+--+--------+--+---------+  Vertebral PSV cm/s 45 EDV cm/s 10 Antegrade  +---------+--------+--+--------+--+---------+  Summary: Right Carotid: Velocities in the right ICA are consistent with a 80-99%                stenosis. Left Carotid: Velocities in the left ICA are consistent with a 1-39% stenosis.  *See table(s) above for measurements and observations.     Preliminary     Medications:    aspirin EC  81 mg Oral Daily   atorvastatin  40 mg Oral q1800   clopidogrel  75 mg Oral Daily   enoxaparin (LOVENOX) injection  40 mg Subcutaneous Q24H   feeding supplement (ENSURE ENLIVE)  237 mL Oral  Q1500   lipase/protease/amylase  36,000 Units Oral TID WC   pantoprazole  40 mg Oral Daily   sertraline  25 mg Oral Daily   thyroid  60 mg Oral Daily   Continuous Infusions:  sodium chloride 50 mL/hr at 07/10/19 1757     LOS: 0 days   Geradine Girt  [facility]   How to contact the [facility] Attending or Consulting provider [place] or covering provider during after hours [place], for this patient?  1. Check the care team in [facility] and look for a) attending/consulting [facility] provider listed and b) the [facility] team listed 2. Log into [email or web address] and use [place]'s universal password to access. If you do not have the password, please contact the hospital operator. 3. Locate the [facility] provider you are looking for under [facility] and page to a number that you can be directly reached. 4. If you still have difficulty reaching the provider, please page the [facility] (Director on Call) for the Hospitalists listed on amion for assistance.  07/11/2019, 3:03 PM

## 2019-07-11 NOTE — Evaluation (Signed)
Physical Therapy Evaluation Patient Details Name: Christian Bennett MRN: [id number] DOB: 11-14-1927 Today's Date: 07/11/2019   History of Present Illness  Pt is an 85 y/o male admitted secondary to L facial droop, L arm weakness, and difficulty speaking. MRI revealed multiple small foci within R hemispher. PMH includes HTN.   Clinical Impression  Pt admitted secondary to problem above with deficits below. Pt with cognitive deficits and with decreased ability to dual task during gait. Required min to min guard for gait and stair navigation. Feel pt will require 24/7 assist at d/c. If family unable to provide, may need to consider SNF level therapy prior to return home. Will continue to follow acutely to maximize functional mobility independence and safety.     Follow Up Recommendations Home health PT;Supervision/Assistance - 24 hour    Equipment Recommendations  Other (comment)(TBD)    Recommendations for Other Services       Precautions / Restrictions Precautions Precautions: Fall Restrictions Weight Bearing Restrictions: No      Mobility  Bed Mobility Overal bed mobility: Needs Assistance Bed Mobility: Supine to Sit;Sit to Supine     Supine to sit: Supervision Sit to supine: Supervision   General bed mobility comments: Supervision for safety. Increased time required.   Transfers Overall transfer level: Needs assistance Equipment used: None Transfers: Sit to/from Stand Sit to Stand: Min assist         General transfer comment: Min A for steadying assist.   Ambulation/Gait Ambulation/Gait assistance: Min guard;Min assist Gait Distance (Feet): 250 Feet Assistive device: None Gait Pattern/deviations: Step-through pattern;Decreased stride length Gait velocity: Decreased   General Gait Details: Pt with instability when performing DGI tasks. Pt also with notable difficulty with dual tasking. Pt would stop when performing DGI tasks and required cues to perform during  ambulation. Up to min A required for steadying.   Stairs Stairs: Yes Stairs assistance: Min guard Stair Management: Two rails;Alternating pattern;Forwards Number of Stairs: 4 General stair comments: Overall steady stair navigation with use of rails. No overt LOB noted.   Wheelchair Mobility    Modified Rankin (Stroke Patients Only) Modified Rankin (Stroke Patients Only) Pre-Morbid Rankin Score: No symptoms Modified Rankin: Moderately severe disability     Balance Overall balance assessment: Needs assistance Sitting-balance support: No upper extremity supported;Feet supported Sitting balance-[name]: Good     Standing balance support: No upper extremity supported;During functional activity Standing balance-[name]: Fair                   Standardized Balance Assessment Standardized Balance Assessment : Dynamic Gait Index   Dynamic Gait Index Level Surface: Mild Impairment Gait with Horizontal Head Turns: Moderate Impairment Gait with Vertical Head Turns: Moderate Impairment Step Over Obstacle: Moderate Impairment       Pertinent Vitals/Pain Pain Assessment: No/denies pain    Home Living Family/patient expects to be discharged to:: Private residence Living Arrangements: Alone Available Help at Discharge: Family;Available PRN/intermittently Type of Home: House Home Access: Stairs to enter Entrance Stairs-Rails: Right Entrance Stairs-Number of Steps: 4 Home Layout: One level Home Equipment: None      Prior Function Level of Independence: Independent               Hand Dominance   Dominant Hand: Right    Extremity/Trunk Assessment   Upper Extremity Assessment Upper Extremity Assessment: Defer to OT evaluation    Lower Extremity Assessment Lower Extremity Assessment: Generalized weakness    Cervical / Trunk Assessment Cervical / Trunk Assessment:  Normal  Communication   Communication: No difficulties  Cognition  Arousal/Alertness: Awake/alert Behavior During Therapy: WFL for tasks assessed/performed Overall Cognitive Status: No family/caregiver present to determine baseline cognitive functioning                                 General Comments: Pt unable to remember how to get to the room following ambulation. Pt also with difficulty dual tasking when performing components of the DGI. Unsure of pt's baseline       General Comments      Exercises     Assessment/Plan    PT Assessment Patient needs continued PT services  PT Problem List Decreased strength;Decreased balance;Decreased mobility;Decreased cognition;Decreased knowledge of use of DME;Decreased safety awareness;Decreased knowledge of precautions       PT Treatment Interventions Gait training;Stair training;Functional mobility training;Therapeutic activities;Therapeutic exercise;Balance training;Patient/family education;Cognitive remediation    PT Goals (Current goals can be found in the Care Plan section)  Acute Rehab PT Goals Patient Stated Goal: to go home PT Goal Formulation: With patient Time For Goal Achievement: 07/25/19 Potential to Achieve Goals: Good    Frequency Min 4X/week   Barriers to discharge        Co-evaluation               AM-PAC PT "6 Clicks" Mobility  Outcome Measure Help needed turning from your back to your side while in a flat bed without using bedrails?: None Help needed moving from lying on your back to sitting on the side of a flat bed without using bedrails?: None Help needed moving to and from a bed to a chair (including a wheelchair)?: A Little Help needed standing up from a chair using your arms (e.g., wheelchair or bedside chair)?: A Little Help needed to walk in hospital room?: A Little Help needed climbing 3-5 steps with a railing? : A Little 6 Click Score: 20    End of Session Equipment Utilized During Treatment: Gait belt Activity Tolerance: Patient tolerated  treatment well Patient left: in bed;with call bell/phone within reach Nurse Communication: Mobility status PT Visit Diagnosis: Unsteadiness on feet (R26.81);Other symptoms and signs involving the nervous system (R29.898)    Time: [id number] PT Time Calculation (min) (ACUTE ONLY): 23 min   Charges:   PT Evaluation $PT Eval Moderate Complexity: 1 Mod PT Treatments $Gait Training: 8-22 mins        Leighton Ruff, PT, DPT  Acute Rehabilitation Services  Pager: [phone] Office: [phone]   Rudean Hitt 07/11/2019, 2:04 PM

## 2019-07-11 NOTE — Progress Notes (Addendum)
Initial Nutrition Assessment  DOCUMENTATION CODES:   Not applicable  INTERVENTION:  Provide Ensure Enlive po once daily, each supplement provides 350 kcal and 20 grams of protein.  Encourage adequate PO intake.   NUTRITION DIAGNOSIS:   Increased nutrient needs related to acute illness as evidenced by estimated needs.  GOAL:   Patient will meet greater than or equal to 90% of their needs  MONITOR:   PO intake, Supplement acceptance, Skin, Weight trends, Labs, I & O's  REASON FOR ASSESSMENT:   Consult (TIA)  ASSESSMENT:   85 y.o. male with medical history significant of hypothyroidism and HTN presenting with neurologic symptoms concerning for CVA.  Pt reports having a good appetite currently and PTA with usual consumption of at least 3 meals a day with no other difficulties. Pt with no significant weight loss per weight records. RD to order nutritional supplements to aid in caloric and protein needs.   NUTRITION - FOCUSED PHYSICAL EXAM:  Depletion likely related to the natural aging process.    Most Recent Value  Orbital Region  Unable to assess  Upper Arm Region  Unable to assess  Thoracic and Lumbar Region  Moderate depletion  Buccal Region  Unable to assess  Temple Region  Mild depletion  Clavicle Bone Region  Moderate depletion  Clavicle and Acromion Bone Region  Moderate depletion  Scapular Bone Region  Unable to assess  Dorsal Hand  Unable to assess  Patellar Region  No depletion  Anterior Thigh Region  No depletion  Posterior Calf Region  No depletion  Edema (RD Assessment)  None  Hair  Reviewed  Eyes  Reviewed  Mouth  Reviewed  Skin  Reviewed  Nails  Reviewed     Labs and medications reviewed.  Diet Order:   Diet Order            Diet Heart Fluid consistency: Thin  Diet effective ____              EDUCATION NEEDS:   Not appropriate for education at this time  Skin:  Skin Assessment: Reviewed RN Assessment  Last BM:  Unknown  Height:    Ht Readings from Last 1 Encounters:  07/10/19 5\' 10"  (1.778 m)    Weight:   Wt Readings from Last 1 Encounters:  07/10/19 73.3 kg    Ideal Body Weight:  75.45 kg  BMI:  Body mass index is 23.19 kg/m.  Estimated Nutritional Needs:   Kcal:  1800-1950  Protein:  80-90 grams  Fluid:  >/= 1.8 L/day    Corrin Parker, MS, RD, LDN Pager # [phone] After hours/ weekend pager # [phone]

## 2019-07-11 NOTE — ED Notes (Signed)
Patient transported to MRI 

## 2019-07-11 NOTE — Progress Notes (Signed)
STROKE TEAM PROGRESS NOTE   INTERVAL HISTORY Pt daughter at bedside. Pt sitting in bed feeling good. His left hand weakness much improved but still has slight weakness. VVS consulted and will discuss him about CEA.   Vitals:   07/11/19 0730 07/11/19 0738 07/11/19 0903 07/11/19 1128  BP: 133/64  (!) 144/70 (!) 160/62  Pulse: (!) 57  (!) 54 65  Resp: 16  16 16   Temp:   97.9 F (36.6 C)   TempSrc:   Oral   SpO2: 96% 99% 100%   Weight:      Height:        CBC:  Recent Labs  Lab 07/10/19 1528 07/10/19 1541  WBC 6.0  --   NEUTROABS 3.4  --   HGB 11.2* 11.9*  HCT 34.0* 35.0*  MCV 93.7  --   PLT 186  --     Basic Metabolic Panel:  Recent Labs  Lab 07/10/19 1528 07/10/19 1541  NA 138 139  K 3.1* 3.1*  CL 102 101  CO2 25  --   GLUCOSE 111* 108*  BUN 14 15  CREATININE 1.42* 1.50*  CALCIUM 9.0  --    Lipid Panel:     Component Value Date/Time   CHOL 185 07/11/2019 0429   CHOL 146 06/23/2017 0915   TRIG 65 07/11/2019 0429   HDL 52 07/11/2019 0429   HDL 51 06/23/2017 0915   CHOLHDL 3.6 07/11/2019 0429   VLDL 13 07/11/2019 0429   LDLCALC 120 (H) 07/11/2019 0429   LDLCALC 82 06/23/2017 0915   HgbA1c:  Lab Results  Component Value Date   HGBA1C 5.5 07/11/2019   Urine Drug Screen:     Component Value Date/Time   LABOPIA NONE DETECTED 07/10/2019 1705   COCAINSCRNUR NONE DETECTED 07/10/2019 1705   LABBENZ NONE DETECTED 07/10/2019 1705   AMPHETMU NONE DETECTED 07/10/2019 1705   THCU NONE DETECTED 07/10/2019 1705   LABBARB NONE DETECTED 07/10/2019 1705    Alcohol Level No results found for: [facility]  IMAGING Mr Angio Head Wo Contrast  Result Date: 07/11/2019 CLINICAL DATA:  Stroke follow-up EXAM: MRA HEAD WITHOUT CONTRAST TECHNIQUE: Angiographic images of the Circle of Willis were obtained using MRA technique without intravenous contrast. COMPARISON:  None. FINDINGS: POSTERIOR CIRCULATION: --Vertebral arteries: Normal V4 segments. --Posterior inferior cerebellar  arteries (PICA): Patent origins from the vertebral arteries. --Anterior inferior cerebellar arteries (AICA): Patent origins from the basilar artery. --Basilar artery: Normal. --Superior cerebellar arteries: Normal. --Posterior cerebral arteries: Multifocal severe stenosis of the right PCA P3 segment. No hemodynamically significant stenosis of the left PCA. ANTERIOR CIRCULATION: --Intracranial internal carotid arteries: Normal. --Anterior cerebral arteries (ACA): Normal. Both A1 segments are present. Patent anterior communicating artery (a-comm). --Middle cerebral arteries (MCA): Normal. IMPRESSION: 1. No proximal large vessel occlusion or high-grade stenosis. 2. Severe stenosis of the right PCA P3 segment. Electronically Signed   By: Ulyses Jarred M.D.   On: 07/11/2019 03:51   Mr Angio Neck W Wo Contrast  Result Date: 07/10/2019 CLINICAL DATA:  Left facial droop EXAM: MRA NECK WITHOUT AND WITH CONTRAST TECHNIQUE: Multiplanar and multiecho pulse sequences of the neck were obtained without and with intravenous contrast. Angiographic images of the neck were obtained using MRA technique without and with intravenous contrast. CONTRAST:  103mL GADAVIST GADOBUTROL 1 MMOL/ML IV SOLN COMPARISON:  None. FINDINGS: Aortic arch: Normal 3 vessel aortic branching pattern. The visualized subclavian arteries are normal. Right carotid system: There is severe stenosis or short segment occlusion of the  proximal right ICA with loss of contrast enhancement and flow related enhancement. There is normal enhancement of the distal ICA. Left carotid system: Normal course and caliber without stenosis or evidence of dissection. Vertebral arteries: Codominant. Vertebral artery origins are normal. Vertebral arteries are normal in course and caliber to the vertebrobasilar confluence without stenosis or evidence of dissection. IMPRESSION: 1. Severe stenosis versus short segment occlusion of the proximal right ICA. 2. No other hemodynamically  significant stenosis of the carotid or vertebral arteries. Electronically Signed   By: Ulyses Jarred M.D.   On: 07/10/2019 21:26   Mr Brain Wo Contrast  Result Date: 07/10/2019 CLINICAL DATA:  Left-sided weakness EXAM: MRI HEAD WITHOUT CONTRAST TECHNIQUE: Multiplanar, multiecho pulse sequences of the brain and surrounding structures were obtained without intravenous contrast. COMPARISON:  None. FINDINGS: BRAIN: Multiple small foci of acute ischemia within the right hemisphere, in the right frontal and parietal lobes. The abnormalities are concentrated along the precentral and postcentral gyri. No contralateral ischemia. Diffuse confluent hyperintense T2-weighted signal within the periventricular, deep and juxtacortical white matter, most commonly due to chronic ischemic microangiopathy. There are old right frontal and right occipital lobe infarct. There is generalized atrophy without lobar predilection. The midline structures are normal. VASCULAR: The major intracranial arterial and venous sinus flow voids are normal. Faint hemosiderin deposition at the old right frontal infarct site. SKULL AND UPPER CERVICAL SPINE: Calvarial bone marrow signal is normal. There is no skull base mass. The visualized upper cervical spine and soft tissues are normal. SINUSES/ORBITS: There are no fluid levels or advanced mucosal thickening. The mastoid air cells and middle ear cavities are free of fluid. The orbits are normal. IMPRESSION: 1. Multiple small foci of acute ischemia within the right hemisphere, concentrated along the precentral and postcentral gyri. This is in keeping with left-sided symptoms. 2. No hemorrhage or mass effect. 3. Old right frontal and right occipital lobe infarcts. 4. Severe chronic small vessel ischemic microangiopathy. Electronically Signed   By: Ulyses Jarred M.D.   On: 07/10/2019 21:21   Ct Head Code Stroke Wo Contrast  Result Date: 07/10/2019 CLINICAL DATA:  Code stroke. Focal neuro deficit less  than 6 hours. Possible stroke. Left facial droop and left arm drift. EXAM: CT HEAD WITHOUT CONTRAST TECHNIQUE: Contiguous axial images were obtained from the base of the skull through the vertex without intravenous contrast. COMPARISON:  None. FINDINGS: Brain: Moderate atrophy. Chronic infarct right frontal lobe. Chronic microvascular ischemic changes in the white matter. Small chronic infarct right occipital lobe. Chronic infarct right internal capsule. Negative for acute infarct, hemorrhage, mass. Vascular: Negative for hyperdense vessel Skull: Negative Sinuses/Orbits: Sinus mucosal disease paranasal sinuses bilaterally. Bilateral cataract surgery Other: None ASPECTS ([place] Stroke Program Early CT Score) - Ganglionic level infarction (caudate, lentiform nuclei, internal capsule, insula, M1-M3 cortex): 7 - Supraganglionic infarction (M4-M6 cortex): 3 Total score (0-10 with 10 being normal): 10 IMPRESSION: 1. Atrophy and extensive chronic ischemic changes. No acute intracranial abnormality 2. ASPECTS is 10 Electronically Signed   By: Franchot Gallo M.D.   On: 07/10/2019 16:01   Vas US Carotid (at [place] Only)  Result Date: 07/11/2019 Carotid Arterial Duplex Study Indications:       TIA. Risk Factors:      Hypertension. Other Factors:     MRA Right ICA severe stenosis. Comparison Study:  no prior Performing Technologist: June Leap RDMS, RVT  Examination Guidelines: A complete evaluation includes B-mode imaging, spectral Doppler, color Doppler, and power Doppler as needed  of all accessible portions of each vessel. Bilateral testing is considered an integral part of a complete examination. Limited examinations for reoccurring indications may be performed as noted.  Right Carotid Findings: +----------+-------+-------+--------+---------------------------------+--------+           PSV    EDV    StenosisPlaque Description               Comments           cm/s   cm/s                                                      +----------+-------+-------+--------+---------------------------------+--------+ CCA Prox  107    8                                                        +----------+-------+-------+--------+---------------------------------+--------+ CCA Distal78     13                                                       +----------+-------+-------+--------+---------------------------------+--------+ ICA Prox  699    238    80-99%  irregular, heterogenous and                                               calcific                                  +----------+-------+-------+--------+---------------------------------+--------+ ICA Mid   229    51                                                       +----------+-------+-------+--------+---------------------------------+--------+ ICA Distal26     10                                                       +----------+-------+-------+--------+---------------------------------+--------+ ECA       68     4                                                        +----------+-------+-------+--------+---------------------------------+--------+ +----------+--------+-------+----------------+-------------------+           PSV cm/sEDV cmsDescribe        Arm Pressure (mmHG) +----------+--------+-------+----------------+-------------------+ [id number]            Multiphasic, WNL                    +----------+--------+-------+----------------+-------------------+ +---------+--------+--+--------+--+---------+  VertebralPSV cm/s69EDV cm/s14Antegrade +---------+--------+--+--------+--+---------+  Left Carotid Findings: +----------+--------+--------+--------+------------------+--------+           PSV cm/sEDV cm/sStenosisPlaque DescriptionComments +----------+--------+--------+--------+------------------+--------+ CCA Prox  176     18                                          +----------+--------+--------+--------+------------------+--------+ CCA Distal122     23                                         +----------+--------+--------+--------+------------------+--------+ ICA Prox  140     27      1-39%   heterogenous               +----------+--------+--------+--------+------------------+--------+ ICA Distal126     23                                         +----------+--------+--------+--------+------------------+--------+ ECA       122     13                                         +----------+--------+--------+--------+------------------+--------+ +----------+--------+--------+----------------+-------------------+           PSV cm/sEDV cm/sDescribe        Arm Pressure (mmHG) +----------+--------+--------+----------------+-------------------+ [id number]             Multiphasic, WNL                    +----------+--------+--------+----------------+-------------------+ +---------+--------+--+--------+--+---------+ VertebralPSV cm/s45EDV cm/s10Antegrade +---------+--------+--+--------+--+---------+  Summary: Right Carotid: Velocities in the right ICA are consistent with a 80-99%                stenosis. Left Carotid: Velocities in the left ICA are consistent with a 1-39% stenosis.  *See table(s) above for measurements and observations.     Preliminary     PHYSICAL EXAM  Temp:  [97.9 F (36.6 C)-98.1 F (36.7 C)] 98 F (36.7 C) (10/07 1551) Pulse Rate:  [53-65] 63 (10/07 1551) Resp:  [13-21] 16 (10/07 1551) BP: (111-160)/(47-74) 127/72 (10/07 1551) SpO2:  [96 %-100 %] 100 % (10/07 1551)  General - Well nourished, well developed, in no apparent distress.  Ophthalmologic - fundi not visualized due to noncooperation.  Cardiovascular - Regular rhythm and rate.  Mental Status -  Level of arousal and orientation to time, place, and person were intact. Language including expression, naming, repetition, comprehension was  assessed and found intact. Fund of Knowledge was assessed and was intact.  Cranial Nerves II - XII - II - Visual field intact OU. III, IV, VI - Extraocular movements intact. V - Facial sensation intact bilaterally. VII - Facial movement intact bilaterally. VIII - Hard of hearing & vestibular intact bilaterally. X - Palate elevates symmetrically. XI - Chin turning & shoulder shrug intact bilaterally. XII - Tongue protrusion intact.  Motor Strength - The patient's strength was normal in all extremities except slight left hand dexterity difficulty and pronator drift was absent.  Bulk was normal and fasciculations were absent.   Motor Tone - Muscle tone was assessed at the neck and appendages and was normal.  Reflexes - The patient's reflexes were symmetrical in all extremities and he had no pathological reflexes.  Sensory - Light touch, temperature/pinprick were assessed and were symmetrical.    Coordination - The patient had normal movements in the hands and feet with no ataxia or dysmetria.  Tremor was absent.  Gait and Station - deferred.   ASSESSMENT/PLAN Mr. Amar Savio is a 85 y.o. male with history of HTN and alcohol use presenting with slurry speech, L facial droop (resolved en route) and L sided weakness.   Stroke: Multiple R brain infarct in setting of severe R ICA stenosis, thromboembolic secondary to largel vessel disease source  Code Stroke CT head No acute abnormality. Small vessel disease. Atrophy. ASPECTS 10.     MRI  Multiple foci acute R brain infarcts. Old R frontal and occipital lobe infarcts. Severe small vessel disease.    MRA head severe R P3 stenosis   MRA neck severe stenosis prox R ICA  Carotid Doppler  R ICA 80-99% stenosis   2D Echo EF 60-65%  LDL 120  HgbA1c 5.5  Lovenox 40 mg sq daily for VTE prophylaxis  No antithrombotic prior to admission, now on aspirin 81 mg daily and clopidogrel 75 mg daily following aspirin and plavix load. Continue  DAPT for 3 months and then ASA alone.   Therapy recommendations:  pending   Disposition:  pending   Carotid stenosis   MRA neck severe stenosis prox R ICA  Carotid Doppler  R ICA 80-99% stenosis   VVS consult (Dr. Donnetta Hutching on board)  Recommend right CEA within 1-2 weeks.  Hypertension  Stable . Permissive hypertension (OK if < 220/120) but gradually normalize in 5-7 days . 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 After hours, contact General Neurology

## 2019-07-11 NOTE — H&P (View-Only) (Signed)
Hospital Consult    Reason for Consult:  CVA, symptomatic right ICA stenosis Requesting Physician:  Dr. Erlinda Hong MRN #:  [id number]  History of Present Illness: This is a 85 y.o. male with past medical history significant for hypertension was brought to the emergency department for stroke work-up.  He had sudden onset of slurred speech left facial droop and left arm weakness.  He was not given TPA as symptoms quickly improved.  Work-up has included an MRI of the brain positive for right hemispheric CVA.  MRA of neck demonstrated high-grade stenosis versus short segment occlusion of proximal right ICA.  Carotid duplex is pending.  His daughter is present during exam this morning.  Patient states and she agrees that his symptoms have completely resolved.  At the time of CVA he was not taking an aspirin, statin, or other antiplatelet.  He is a former smoker.  Patient's daughter states he is very functional for his age and lives alone and is able to take care of himself.  Past Medical History:  Diagnosis Date  . Acid reflux   . Hypertension   . Thyroid disease     Past Surgical History:  Procedure Laterality Date  . INGUINAL HERNIA REPAIR    . LIPOMA EXCISION    . MELANOMA EXCISION    . REPLACEMENT TOTAL KNEE BILATERAL      Allergies  Allergen Reactions  . Eggs Or Egg-Derived [employer]  . Doxycycline Hives    Prior to Admission medications   Medication Sig Start Date End Date Taking? Authorizing Provider  amLODipine (NORVASC) 5 MG tablet Take 1 tablet by mouth once daily 05/16/19  Yes Opalski, Neoma Laming, DO  chlorthalidone (HYGROTON) 25 MG tablet Take 1 tablet by mouth once daily 04/23/19  Yes Opalski, Deborah, DO  fluticasone (FLONASE) 50 MCG/ACT nasal spray 2 sprays each nostril after sinus rinse 12/12/18  Yes Opalski, Deborah, DO  lipase/protease/amylase (CREON) 36000 UNITS CPEP capsule Take 36,000 Units by mouth 3 (three) times daily with meals.   Yes [provider]  NP  THYROID 60 MG tablet TAKE 1 TABLET BY MOUTH ONCE DAILY BEFORE BREAKFAST Patient taking differently: Take 60 mg by mouth daily.  04/09/19  Yes Opalski, Neoma Laming, DO  Omeprazole-Sodium Bicarbonate (ZEGERID) 20-1100 MG CAPS capsule Take 1 capsule by mouth daily before breakfast.   Yes [provider]  sertraline (ZOLOFT) 25 MG tablet Take 1 tablet by mouth once daily 06/20/19  Yes Opalski, Deborah, DO  Skin Protectants, Misc. (EUCERIN) cream APPLY TWICE DAILY AS NEEDED FOR RASH OR ITCHING Patient taking differently: Apply 1 application topically 2 (two) times daily as needed for dry skin.  12/04/18  Yes Opalski, Neoma Laming, DO  Triamcinolone Acetonide (TRIAMCINOLONE 0.1 % CREAM : EUCERIN) CREA Apply 1 application topically 2 (two) times daily as needed. Patient taking differently: Apply 1 application topically 2 (two) times daily as needed for rash.  04/25/19  Yes Opalski, Neoma Laming, DO  Vitamin D, Ergocalciferol, (DRISDOL) 50000 units CAPS capsule Take 1 capsule (50,000 Units total) by mouth every 7 (seven) days. 06/20/18  Yes Mellody Dance, DO    Social History   Socioeconomic History  . Marital status: Widowed    Spouse name: Not on file  . Number of children: Not on file  . Years of education: Not on file  . Highest education level: Not on file  Occupational History  . Occupation: retired  Scientific laboratory technician  . Financial resource strain: Not on file  .  Food insecurity    Worry: Not on file    Inability: Not on file  . Transportation needs    Medical: Not on file    Non-medical: Not on file  Tobacco Use  . Smoking status: Former Smoker    Quit date: 1953    Years since quitting: 67.8  . Smokeless tobacco: Never Used  Substance and Sexual Activity  . Alcohol use: Yes    Comment: occasionally - everyday "if I can get ahold of it", vodka 2-3 minibottles daily or "as many as I can get ahold of"  . Drug use: No  . Sexual activity: Not Currently    Birth control/protection: None  Lifestyle   . Physical activity    Days per week: Not on file    Minutes per session: Not on file  . Stress: Not on file  Relationships  . Social Herbalist on phone: Not on file    Gets together: Not on file    Attends religious service: Not on file    Active member of club or organization: Not on file    Attends meetings of clubs or organizations: Not on file    Relationship status: Not on file  . Intimate partner violence    Fear of current or ex partner: Not on file    Emotionally abused: Not on file    Physically abused: Not on file    Forced sexual activity: Not on file  Other Topics Concern  . Not on file  Social History Narrative  . Not on file     Family History  Problem Relation Age of Onset  . Stroke Mother   . Stroke Father     ROS: Otherwise negative unless mentioned in HPI  Physical Examination  Vitals:   07/11/19 0738 07/11/19 0903  BP:  (!) 144/70  Pulse:  (!) 54  Resp:  16  Temp:  97.9 F (36.6 C)  SpO2: 99% 100%   Body mass index is 23.19 kg/m.  General:  WDWN in NAD Gait: Not observed HENT: WNL, normocephalic Pulmonary: normal non-labored breathing, without Rales, rhonchi,  wheezing Cardiac: regular Abdomen:  soft, NT/ND, no masses Skin: without rashes Vascular Exam/Pulses: symmetrical radial and ATA pulses Extremities: without ischemic changes, without Gangrene , without cellulitis; without open wounds;  Musculoskeletal: no muscle wasting or atrophy  Neurologic: A&O X 3 Psychiatric:  The pt has Normal affect. Lymph:  Unremarkable  CBC    Component Value Date/Time   WBC 6.0 07/10/2019 1528   RBC 3.63 (L) 07/10/2019 1528   HGB 11.9 (L) 07/10/2019 1541   HGB 12.1 (L) 12/12/2018 1036   HCT 35.0 (L) 07/10/2019 1541   HCT 35.6 (L) 12/12/2018 1036   PLT 186 07/10/2019 1528   PLT 185 12/12/2018 1036   MCV 93.7 07/10/2019 1528   MCV 90 12/12/2018 1036   MCH 30.9 07/10/2019 1528   MCHC 32.9 07/10/2019 1528   RDW 13.3 07/10/2019 1528    RDW 12.8 12/12/2018 1036   LYMPHSABS 1.5 07/10/2019 1528   LYMPHSABS 0.8 12/12/2018 1036   MONOABS 0.7 07/10/2019 1528   EOSABS 0.4 07/10/2019 1528   EOSABS 0.9 (H) 12/12/2018 1036   BASOSABS 0.1 07/10/2019 1528   BASOSABS 0.1 12/12/2018 1036    BMET    Component Value Date/Time   NA 139 07/10/2019 1541   NA 134 12/12/2018 1036   K 3.1 (L) 07/10/2019 1541   CL 101 07/10/2019 1541   CO2 25  07/10/2019 1528   GLUCOSE 108 (H) 07/10/2019 1541   BUN 15 07/10/2019 1541   BUN 23 12/12/2018 1036   CREATININE 1.50 (H) 07/10/2019 1541   CALCIUM 9.0 07/10/2019 1528   GFRNONAA 44 (L) 07/10/2019 1528   GFRAA 51 (L) 07/10/2019 1528    COAGS: Lab Results  Component Value Date   INR 1.1 07/10/2019   INR 1.33 02/25/2011   INR 1.40 02/24/2011     Non-Invasive Vascular Imaging:   MRI brain positive for right hemispheric CVA MRA demonstrating high-grade stenosis versus short segment occlusion of proximal right ICA Carotid duplex pending   ASSESSMENT/PLAN: This is a 85 y.o. male symptomatic right ICA stenosis  Speech, facial droop and left arm weakness have completely resolved MR brain demonstrates scattered areas of acute ischemia in right hemisphere MRA demonstrates high-grade stenosis versus short segment occlusion of proximal right ICA; Carotid duplex pending May also need CTA of neck pending carotid duplex to determine if this is a high-grade stenosis or an occlusion of the right ICA as this will affect the treatment plan Agree with addition of aspirin, Plavix, and statin On call vascular surgeon Dr. Donnetta Hutching will evaluate the patient later today and provide further treatment plans   Dagoberto Ligas PA-C [facility] [phone]   I have examined the patient, reviewed and agree with above.  Very pleasant 85 year old right-handed gentleman.  Admitted with left facial droop and left arm weakness.  Work-up included MRA showing critical stenosis of his right  internal carotid artery.  Duplex today reveals high end in the 80 to 99% stenosis.  Had a long discussion with the patient and his daughter present.  Explained that in all likelihood this was the source of his TIA.  Fortunately he appears to have returned to his baseline neurologic status.  He and his daughter report that he is quite active and independent.  I have recommended right carotid endarterectomy for reduction of stroke risk.  I explained the procedure in detail including expected 1 night hospitalization.  Explained the 1 to 2% risk of stroke with surgery.  Also explained the very slight risk for cranial nerve injury, bleeding and infection.  He is concerned regarding the cost of the procedure and hospitalization.  His daughter reports that he does have supplemental insurance and that this should not be a concern for him.  He is not willing to make the decision for surgery at this time and wishes to think about it.  He is safe for discharge and we would recommend surgery within the 1 to 2-week timeframe.  We will follow-up with the patient if he is discharged.  Otherwise I will see him in the morning for further discussion  Curt Jews, MD 07/11/2019 6:06 PM

## 2019-07-11 NOTE — Evaluation (Signed)
Speech Language Pathology Evaluation Patient Details Name: Christian Bennett MRN: [id number] DOB: 1928-06-12 Today's Date: 07/11/2019 Time: 1044-1100 SLP Time Calculation (min) (ACUTE ONLY): 16 min  Problem List:  Patient Active Problem List   Diagnosis Date Noted  . CVA (cerebral vascular accident) ([place]) 07/11/2019  . TIA (transient ischemic attack) 07/10/2019  . Adjustment disorder with mixed anxiety and depressed mood 06/20/2018  . Vitamin D insufficiency 06/20/2018  . History of anemia 06/20/2018  . Medically noncompliant-   does not want to go for hearing eval 06/20/2018  . Bilateral impacted cerumen 02/07/2018  . Hearing difficulty of both ears 02/03/2018  . Environmental and seasonal allergies 02/03/2018  . Eustachian tube dysfunction, bilateral 02/03/2018  . History of vitamin D deficiency 01/30/2018  . Contact dermatitis and eczema 10/19/2017  . HTN (hypertension) 06/22/2017  . GERD (gastroesophageal reflux disease) 06/22/2017  . Hx of colonoscopy 06/22/2017  . Mood disorder ([place]) 06/22/2017  . Hypothyroidism 06/22/2017  . Chronic pancreatitis ([place]) 06/22/2017   Past Medical History:  Past Medical History:  Diagnosis Date  . Acid reflux   . Hypertension   . Thyroid disease    Past Surgical History:  Past Surgical History:  Procedure Laterality Date  . INGUINAL HERNIA REPAIR    . LIPOMA EXCISION    . MELANOMA EXCISION    . REPLACEMENT TOTAL KNEE BILATERAL     HPI:  This is a 85 y.o. male with past medical history significant for hypertension was brought to the emergency department for stroke work-up.  He had sudden onset of slurred speech left facial droop and left arm weakness.  He was not given TPA as symptoms quickly improved.  Work-up has included an MRI of the brain positive for right hemispheric CVA, old right frontal and right occipital lobe infarcts with severe chronic small vessel ischemic microangiopathy.  MRA of neck demonstrated high-grade stenosis versus  short segment occlusion of proximal right ICA.  Patient states and she agrees that his symptoms have completely resolved.  At the time of CVA he was not taking an aspirin, statin, or other antiplatelet.  He is a former smoker.  Patient's daughter states he is very functional for his age and lives alone and is able to take care of himself.      Assessment / Plan / Recommendation Clinical Impression  Pt presents with moderate cognitive impairments as evidenced by score of 17 out of 30 on MOCA version 7.1 (n=>26). Specifically, pt is not oriented to date or situation, has deficits in short term memory and recall of new information, deficits task organization and problem solving, with overall awareness and insight into cognitive deficits poor. Per chart review, it appears there is some report of cognitive decline with follow up recommended with neurologist. However it is uncertain the exact nature of decline or risk that it posed at baseline to pt's safety. Although pt's score was less than optimal, he may be more functional within home environment. Based on the above mentioned scores, would recommend 24 hour supervision but will defer to PT/OT and MD to make final recommendation. ST to follow during acute hospitalization.     SLP Assessment  SLP Recommendation/Assessment: Patient needs continued Speech Lanaguage Pathology Services SLP Visit Diagnosis: Cognitive communication deficit (R41.841)    Follow Up Recommendations  (TBD)    Frequency and Duration min 2x/week  2 weeks      SLP Evaluation Cognition  Overall Cognitive Status: No family/caregiver present to determine baseline cognitive functioning Arousal/Alertness: Awake/alert Orientation  Level: Oriented to person;Disoriented to time;Disoriented to situation;Oriented to place Attention: Selective Selective Attention: Impaired Selective Attention Impairment: Verbal complex;Functional complex Memory: Impaired Memory Impairment: Decreased  short term memory;Decreased recall of new information Decreased Short Term Memory: Verbal basic;Functional basic Awareness: Impaired Awareness Impairment: Emergent impairment Problem Solving: Impaired Problem Solving Impairment: Verbal basic;Functional basic Safety/Judgment: Impaired       Comprehension  Auditory Comprehension Overall Auditory Comprehension: Appears within functional limits for tasks assessed Visual Recognition/Discrimination Discrimination: Within Function Limits Reading Comprehension Reading Status: Not tested    Expression Expression Primary Mode of Expression: Verbal Verbal Expression Overall Verbal Expression: Appears within functional limits for tasks assessed Written Expression Dominant Hand: Right Written Expression: Within Functional Limits   Oral / Motor  Oral Motor/Sensory Function Overall Oral Motor/Sensory Function: Within functional limits Motor Speech Overall Motor Speech: Appears within functional limits for tasks assessed   GO                    Boubacar Lerette 07/11/2019, 1:23 PM

## 2019-07-11 NOTE — Consult Note (Addendum)
Hospital Consult    Reason for Consult:  CVA, symptomatic right ICA stenosis Requesting Physician:  Dr. Erlinda Hong MRN #:  [id number]  History of Present Illness: This is a 85 y.o. male with past medical history significant for hypertension was brought to the emergency department for stroke work-up.  He had sudden onset of slurred speech left facial droop and left arm weakness.  He was not given TPA as symptoms quickly improved.  Work-up has included an MRI of the brain positive for right hemispheric CVA.  MRA of neck demonstrated high-grade stenosis versus short segment occlusion of proximal right ICA.  Carotid duplex is pending.  His daughter is present during exam this morning.  Patient states and she agrees that his symptoms have completely resolved.  At the time of CVA he was not taking an aspirin, statin, or other antiplatelet.  He is a former smoker.  Patient's daughter states he is very functional for his age and lives alone and is able to take care of himself.  Past Medical History:  Diagnosis Date  . Acid reflux   . Hypertension   . Thyroid disease     Past Surgical History:  Procedure Laterality Date  . INGUINAL HERNIA REPAIR    . LIPOMA EXCISION    . MELANOMA EXCISION    . REPLACEMENT TOTAL KNEE BILATERAL      Allergies  Allergen Reactions  . Eggs Or Egg-Derived [employer]  . Doxycycline Hives    Prior to Admission medications   Medication Sig Start Date End Date Taking? Authorizing Provider  amLODipine (NORVASC) 5 MG tablet Take 1 tablet by mouth once daily 05/16/19  Yes Opalski, Neoma Laming, DO  chlorthalidone (HYGROTON) 25 MG tablet Take 1 tablet by mouth once daily 04/23/19  Yes Opalski, Deborah, DO  fluticasone (FLONASE) 50 MCG/ACT nasal spray 2 sprays each nostril after sinus rinse 12/12/18  Yes Opalski, Deborah, DO  lipase/protease/amylase (CREON) 36000 UNITS CPEP capsule Take 36,000 Units by mouth 3 (three) times daily with meals.   Yes [provider]  NP  THYROID 60 MG tablet TAKE 1 TABLET BY MOUTH ONCE DAILY BEFORE BREAKFAST Patient taking differently: Take 60 mg by mouth daily.  04/09/19  Yes Opalski, Neoma Laming, DO  Omeprazole-Sodium Bicarbonate (ZEGERID) 20-1100 MG CAPS capsule Take 1 capsule by mouth daily before breakfast.   Yes [provider]  sertraline (ZOLOFT) 25 MG tablet Take 1 tablet by mouth once daily 06/20/19  Yes Opalski, Deborah, DO  Skin Protectants, Misc. (EUCERIN) cream APPLY TWICE DAILY AS NEEDED FOR RASH OR ITCHING Patient taking differently: Apply 1 application topically 2 (two) times daily as needed for dry skin.  12/04/18  Yes Opalski, Neoma Laming, DO  Triamcinolone Acetonide (TRIAMCINOLONE 0.1 % CREAM : EUCERIN) CREA Apply 1 application topically 2 (two) times daily as needed. Patient taking differently: Apply 1 application topically 2 (two) times daily as needed for rash.  04/25/19  Yes Opalski, Neoma Laming, DO  Vitamin D, Ergocalciferol, (DRISDOL) 50000 units CAPS capsule Take 1 capsule (50,000 Units total) by mouth every 7 (seven) days. 06/20/18  Yes Mellody Dance, DO    Social History   Socioeconomic History  . Marital status: Widowed    Spouse name: Not on file  . Number of children: Not on file  . Years of education: Not on file  . Highest education level: Not on file  Occupational History  . Occupation: retired  Scientific laboratory technician  . Financial resource strain: Not on file  .  Food insecurity    Worry: Not on file    Inability: Not on file  . Transportation needs    Medical: Not on file    Non-medical: Not on file  Tobacco Use  . Smoking status: Former Smoker    Quit date: 1953    Years since quitting: 67.8  . Smokeless tobacco: Never Used  Substance and Sexual Activity  . Alcohol use: Yes    Comment: occasionally - everyday "if I can get ahold of it", vodka 2-3 minibottles daily or "as many as I can get ahold of"  . Drug use: No  . Sexual activity: Not Currently    Birth control/protection: None  Lifestyle   . Physical activity    Days per week: Not on file    Minutes per session: Not on file  . Stress: Not on file  Relationships  . Social Herbalist on phone: Not on file    Gets together: Not on file    Attends religious service: Not on file    Active member of club or organization: Not on file    Attends meetings of clubs or organizations: Not on file    Relationship status: Not on file  . Intimate partner violence    Fear of current or ex partner: Not on file    Emotionally abused: Not on file    Physically abused: Not on file    Forced sexual activity: Not on file  Other Topics Concern  . Not on file  Social History Narrative  . Not on file     Family History  Problem Relation Age of Onset  . Stroke Mother   . Stroke Father     ROS: Otherwise negative unless mentioned in HPI  Physical Examination  Vitals:   07/11/19 0738 07/11/19 0903  BP:  (!) 144/70  Pulse:  (!) 54  Resp:  16  Temp:  97.9 F (36.6 C)  SpO2: 99% 100%   Body mass index is 23.19 kg/m.  General:  WDWN in NAD Gait: Not observed HENT: WNL, normocephalic Pulmonary: normal non-labored breathing, without Rales, rhonchi,  wheezing Cardiac: regular Abdomen:  soft, NT/ND, no masses Skin: without rashes Vascular Exam/Pulses: symmetrical radial and ATA pulses Extremities: without ischemic changes, without Gangrene , without cellulitis; without open wounds;  Musculoskeletal: no muscle wasting or atrophy  Neurologic: A&O X 3 Psychiatric:  The pt has Normal affect. Lymph:  Unremarkable  CBC    Component Value Date/Time   WBC 6.0 07/10/2019 1528   RBC 3.63 (L) 07/10/2019 1528   HGB 11.9 (L) 07/10/2019 1541   HGB 12.1 (L) 12/12/2018 1036   HCT 35.0 (L) 07/10/2019 1541   HCT 35.6 (L) 12/12/2018 1036   PLT 186 07/10/2019 1528   PLT 185 12/12/2018 1036   MCV 93.7 07/10/2019 1528   MCV 90 12/12/2018 1036   MCH 30.9 07/10/2019 1528   MCHC 32.9 07/10/2019 1528   RDW 13.3 07/10/2019 1528    RDW 12.8 12/12/2018 1036   LYMPHSABS 1.5 07/10/2019 1528   LYMPHSABS 0.8 12/12/2018 1036   MONOABS 0.7 07/10/2019 1528   EOSABS 0.4 07/10/2019 1528   EOSABS 0.9 (H) 12/12/2018 1036   BASOSABS 0.1 07/10/2019 1528   BASOSABS 0.1 12/12/2018 1036    BMET    Component Value Date/Time   NA 139 07/10/2019 1541   NA 134 12/12/2018 1036   K 3.1 (L) 07/10/2019 1541   CL 101 07/10/2019 1541   CO2 25  07/10/2019 1528   GLUCOSE 108 (H) 07/10/2019 1541   BUN 15 07/10/2019 1541   BUN 23 12/12/2018 1036   CREATININE 1.50 (H) 07/10/2019 1541   CALCIUM 9.0 07/10/2019 1528   GFRNONAA 44 (L) 07/10/2019 1528   GFRAA 51 (L) 07/10/2019 1528    COAGS: Lab Results  Component Value Date   INR 1.1 07/10/2019   INR 1.33 02/25/2011   INR 1.40 02/24/2011     Non-Invasive Vascular Imaging:   MRI brain positive for right hemispheric CVA MRA demonstrating high-grade stenosis versus short segment occlusion of proximal right ICA Carotid duplex pending   ASSESSMENT/PLAN: This is a 85 y.o. male symptomatic right ICA stenosis  Speech, facial droop and left arm weakness have completely resolved MR brain demonstrates scattered areas of acute ischemia in right hemisphere MRA demonstrates high-grade stenosis versus short segment occlusion of proximal right ICA; Carotid duplex pending May also need CTA of neck pending carotid duplex to determine if this is a high-grade stenosis or an occlusion of the right ICA as this will affect the treatment plan Agree with addition of aspirin, Plavix, and statin On call vascular surgeon Dr. Donnetta Hutching will evaluate the patient later today and provide further treatment plans   Dagoberto Ligas PA-C [facility] [phone]   I have examined the patient, reviewed and agree with above.  Very pleasant 85 year old right-handed gentleman.  Admitted with left facial droop and left arm weakness.  Work-up included MRA showing critical stenosis of his right  internal carotid artery.  Duplex today reveals high end in the 80 to 99% stenosis.  Had a long discussion with the patient and his daughter present.  Explained that in all likelihood this was the source of his TIA.  Fortunately he appears to have returned to his baseline neurologic status.  He and his daughter report that he is quite active and independent.  I have recommended right carotid endarterectomy for reduction of stroke risk.  I explained the procedure in detail including expected 1 night hospitalization.  Explained the 1 to 2% risk of stroke with surgery.  Also explained the very slight risk for cranial nerve injury, bleeding and infection.  He is concerned regarding the cost of the procedure and hospitalization.  His daughter reports that he does have supplemental insurance and that this should not be a concern for him.  He is not willing to make the decision for surgery at this time and wishes to think about it.  He is safe for discharge and we would recommend surgery within the 1 to 2-week timeframe.  We will follow-up with the patient if he is discharged.  Otherwise I will see him in the morning for further discussion  Christian Jews, MD 07/11/2019 6:06 PM

## 2019-07-11 NOTE — Progress Notes (Signed)
Carotid duplex       has been completed. Preliminary results can be found under CV proc through chart review. Keren Alverio, BS, RDMS, RVT   

## 2019-07-11 NOTE — ED Notes (Signed)
Tele   Breakfast ordered  

## 2019-07-12 MED ORDER — CLOPIDOGREL BISULFATE 75 MG PO TABS: 75.0000 mg | ORAL_TABLET | Freq: Every day | ORAL | 0 refills | Status: DC

## 2019-07-12 MED ORDER — MAGNESIUM SULFATE IN D5W 1-5 GM/100ML-% IV SOLN
1.0000 g | Freq: Once | INTRAVENOUS | Status: DC
  Filled 2019-07-12: qty 100

## 2019-07-12 MED ORDER — ASPIRIN 81 MG PO TBEC: 81.0000 mg | DELAYED_RELEASE_TABLET | Freq: Every day | ORAL | Status: DC

## 2019-07-12 MED ORDER — POTASSIUM CHLORIDE CRYS ER 20 MEQ PO TBCR
40.0000 meq | EXTENDED_RELEASE_TABLET | Freq: Once | ORAL | Status: AC
  Administered 2019-07-12: 40 meq via ORAL
  Filled 2019-07-12: qty 2

## 2019-07-12 MED ORDER — MAGNESIUM OXIDE 400 (241.3 MG) MG PO TABS: 400.0000 mg | ORAL_TABLET | Freq: Every day | ORAL | Status: DC

## 2019-07-12 MED ORDER — ATORVASTATIN CALCIUM 40 MG PO TABS: 40.0000 mg | ORAL_TABLET | Freq: Every day | ORAL | 0 refills | Status: DC

## 2019-07-12 MED ORDER — MAGNESIUM OXIDE 400 (241.3 MG) MG PO TABS
400.0000 mg | ORAL_TABLET | Freq: Every day | ORAL | Status: DC
  Administered 2019-07-12: 400 mg via ORAL
  Filled 2019-07-12: qty 1

## 2019-07-12 NOTE — Discharge Summary (Signed)
Physician Discharge Summary  Christian Bennett [id number] DOB: 1927-10-25 DOA: 07/10/2019  PCP: Mellody Dance, DO  Admit date: 07/10/2019 Discharge date: 07/12/2019  Admitted From: home Discharge disposition: home with daughter   Recommendations for Outpatient Follow-Up:   1. Surgery with Dr. Donnetta Hutching- right CEA   Discharge Diagnosis:   Active Problems:   HTN (hypertension)   Hypothyroidism   Chronic pancreatitis ([place])   CVA (cerebral vascular accident) [facility])    Discharge Condition: Improved.  Diet recommendation: Low sodium, heart healthy  Wound care: None.  Code status: Full.   History of Present Illness:   Christian Bennett is a 85 y.o. male with medical history significant of hypothyroidism and HTN presenting with neurologic symptoms concerning for CVA.   He was sitting on the couch and his daughter said he looked like he was having a mini-stroke and she called 911.   "I don't feel like there's anything wrong with me.  If they'd let me get out of here where I could walk, I'd walk home if they'd let me."  Denies n/w/t.  Denies dysphagia or dysarthria.  I spoke with his daughter who reports that she got there about 220.  About 225, his voice sounded funny.  By 230, he couldn't speak at all; left facial droop; and left arm weakness.  She called 911 and all symptoms were as described.  He is now back to normal.  He ate a good lunch and was hauling wood.  His PCP wanted him to have a neuro consult "to check for dementia or Alzheimer's."     Hospital Course by Problem:   Stroke: Multiple R brain infarct in setting of severe R ICA stenosis, thromboembolic secondary to largel vessel disease source  Code Stroke CT head No acute abnormality. Small vessel disease. Atrophy. ASPECTS 10.     MRI  Multiple foci acute R brain infarcts. Old R frontal and occipital lobe infarcts. Severe small vessel disease.    MRA head severe R P3 stenosis   MRA neck severe stenosis prox  R ICA  Carotid Doppler  R ICA 80-99% stenosis   2D Echo EF 60-65%  LDL 120  HgbA1c 5.5 . Continue DAPT for 3 months and then ASA alone.    Carotid stenosis   MRA neck severe stenosis prox R ICA  Carotid Doppler  R ICA 80-99% stenosis   VVS Dr. Donnetta Hutching on board  Planned right CEA 10/21  Hypertension  Stable  Permissive hypertension (OK if < 220/120) but gradually normalize in 5-7 days  SBP goal 130-150 given R ICA stenosis before CEA  Hyperlipidemia  Home meds:  No statin  Now on lipitor 40  LDL 120, goal < 70  Continue statin at discharge     Medical Consultants:   Vascular neuro   Discharge Exam:   Vitals:   07/11/19 2316 07/12/19 0819  BP: 133/69 (!) 132/58  Pulse: 61 66  Resp: 18 20  Temp:  (!) 97.5 F (36.4 C)  SpO2: 99% 98%   Vitals:   07/11/19 1551 07/11/19 1919 07/11/19 2316 07/12/19 0819  BP: 127/72 140/62 133/69 (!) 132/58  Pulse: 63 69 61 66  Resp: 16 18 18 20   Temp: 98 F (36.7 C) (!) 97.5 F (36.4 C) 97.8 F (36.6 C) (!) 97.5 F (36.4 C)  TempSrc: Oral Oral Oral Oral  SpO2: 100% 99% 99% 98%  Weight:      Height:        General exam:  Appears calm and comfortable.   The results of significant diagnostics from this hospitalization (including imaging, microbiology, ancillary and laboratory) are listed below for reference.     Procedures and Diagnostic Studies:   Mr Angio Head Wo Contrast  Result Date: 07/11/2019 CLINICAL DATA:  Stroke follow-up EXAM: MRA HEAD WITHOUT CONTRAST TECHNIQUE: Angiographic images of the Circle of Willis were obtained using MRA technique without intravenous contrast. COMPARISON:  None. FINDINGS: POSTERIOR CIRCULATION: --Vertebral arteries: Normal V4 segments. --Posterior inferior cerebellar arteries (PICA): Patent origins from the vertebral arteries. --Anterior inferior cerebellar arteries (AICA): Patent origins from the basilar artery. --Basilar artery: Normal. --Superior cerebellar arteries:  Normal. --Posterior cerebral arteries: Multifocal severe stenosis of the right PCA P3 segment. No hemodynamically significant stenosis of the left PCA. ANTERIOR CIRCULATION: --Intracranial internal carotid arteries: Normal. --Anterior cerebral arteries (ACA): Normal. Both A1 segments are present. Patent anterior communicating artery (a-comm). --Middle cerebral arteries (MCA): Normal. IMPRESSION: 1. No proximal large vessel occlusion or high-grade stenosis. 2. Severe stenosis of the right PCA P3 segment. Electronically Signed   By: Ulyses Jarred M.D.   On: 07/11/2019 03:51   Mr Angio Neck W Wo Contrast  Result Date: 07/10/2019 CLINICAL DATA:  Left facial droop EXAM: MRA NECK WITHOUT AND WITH CONTRAST TECHNIQUE: Multiplanar and multiecho pulse sequences of the neck were obtained without and with intravenous contrast. Angiographic images of the neck were obtained using MRA technique without and with intravenous contrast. CONTRAST:  47mL GADAVIST GADOBUTROL 1 MMOL/ML IV SOLN COMPARISON:  None. FINDINGS: Aortic arch: Normal 3 vessel aortic branching pattern. The visualized subclavian arteries are normal. Right carotid system: There is severe stenosis or short segment occlusion of the proximal right ICA with loss of contrast enhancement and flow related enhancement. There is normal enhancement of the distal ICA. Left carotid system: Normal course and caliber without stenosis or evidence of dissection. Vertebral arteries: Codominant. Vertebral artery origins are normal. Vertebral arteries are normal in course and caliber to the vertebrobasilar confluence without stenosis or evidence of dissection. IMPRESSION: 1. Severe stenosis versus short segment occlusion of the proximal right ICA. 2. No other hemodynamically significant stenosis of the carotid or vertebral arteries. Electronically Signed   By: Ulyses Jarred M.D.   On: 07/10/2019 21:26   Mr Brain Wo Contrast  Result Date: 07/10/2019 CLINICAL DATA:  Left-sided  weakness EXAM: MRI HEAD WITHOUT CONTRAST TECHNIQUE: Multiplanar, multiecho pulse sequences of the brain and surrounding structures were obtained without intravenous contrast. COMPARISON:  None. FINDINGS: BRAIN: Multiple small foci of acute ischemia within the right hemisphere, in the right frontal and parietal lobes. The abnormalities are concentrated along the precentral and postcentral gyri. No contralateral ischemia. Diffuse confluent hyperintense T2-weighted signal within the periventricular, deep and juxtacortical white matter, most commonly due to chronic ischemic microangiopathy. There are old right frontal and right occipital lobe infarct. There is generalized atrophy without lobar predilection. The midline structures are normal. VASCULAR: The major intracranial arterial and venous sinus flow voids are normal. Faint hemosiderin deposition at the old right frontal infarct site. SKULL AND UPPER CERVICAL SPINE: Calvarial bone marrow signal is normal. There is no skull base mass. The visualized upper cervical spine and soft tissues are normal. SINUSES/ORBITS: There are no fluid levels or advanced mucosal thickening. The mastoid air cells and middle ear cavities are free of fluid. The orbits are normal. IMPRESSION: 1. Multiple small foci of acute ischemia within the right hemisphere, concentrated along the precentral and postcentral gyri. This is in keeping with left-sided symptoms.  2. No hemorrhage or mass effect. 3. Old right frontal and right occipital lobe infarcts. 4. Severe chronic small vessel ischemic microangiopathy. Electronically Signed   By: Ulyses Jarred M.D.   On: 07/10/2019 21:21   Ct Head Code Stroke Wo Contrast  Result Date: 07/10/2019 CLINICAL DATA:  Code stroke. Focal neuro deficit less than 6 hours. Possible stroke. Left facial droop and left arm drift. EXAM: CT HEAD WITHOUT CONTRAST TECHNIQUE: Contiguous axial images were obtained from the base of the skull through the vertex without  intravenous contrast. COMPARISON:  None. FINDINGS: Brain: Moderate atrophy. Chronic infarct right frontal lobe. Chronic microvascular ischemic changes in the white matter. Small chronic infarct right occipital lobe. Chronic infarct right internal capsule. Negative for acute infarct, hemorrhage, mass. Vascular: Negative for hyperdense vessel Skull: Negative Sinuses/Orbits: Sinus mucosal disease paranasal sinuses bilaterally. Bilateral cataract surgery Other: None ASPECTS ([place] Stroke Program Early CT Score) - Ganglionic level infarction (caudate, lentiform nuclei, internal capsule, insula, M1-M3 cortex): 7 - Supraganglionic infarction (M4-M6 cortex): 3 Total score (0-10 with 10 being normal): 10 IMPRESSION: 1. Atrophy and extensive chronic ischemic changes. No acute intracranial abnormality 2. ASPECTS is 10 Electronically Signed   By: Franchot Gallo M.D.   On: 07/10/2019 16:01   Vas US Carotid (at [place] Only)  Result Date: 07/11/2019 Carotid Arterial Duplex Study Indications:       TIA. Risk Factors:      Hypertension. Other Factors:     MRA Right ICA severe stenosis. Comparison Study:  no prior Performing Technologist: June Leap RDMS, RVT  Examination Guidelines: A complete evaluation includes B-mode imaging, spectral Doppler, color Doppler, and power Doppler as needed of all accessible portions of each vessel. Bilateral testing is considered an integral part of a complete examination. Limited examinations for reoccurring indications may be performed as noted.  Right Carotid Findings: +----------+-------+-------+--------+---------------------------------+--------+             PSV     EDV     Stenosis Plaque Description                Comments              cm/s    cm/s                                                         +----------+-------+-------+--------+---------------------------------+--------+  CCA Prox   107     8                                                             +----------+-------+-------+--------+---------------------------------+--------+  CCA Distal 78      13                                                           +----------+-------+-------+--------+---------------------------------+--------+  ICA Prox   699     238     80-99%   irregular, heterogenous and  calcific                                    +----------+-------+-------+--------+---------------------------------+--------+  ICA Mid    229     51                                                           +----------+-------+-------+--------+---------------------------------+--------+  ICA Distal 26      10                                                           +----------+-------+-------+--------+---------------------------------+--------+  ECA        68      4                                                            +----------+-------+-------+--------+---------------------------------+--------+ +----------+--------+-------+----------------+-------------------+             PSV cm/s EDV cms Describe         Arm Pressure (mmHG)  +----------+--------+-------+----------------+-------------------+  Subclavian 236              Multiphasic, WNL                      +----------+--------+-------+----------------+-------------------+ +---------+--------+--+--------+--+---------+  Vertebral PSV cm/s 69 EDV cm/s 14 Antegrade  +---------+--------+--+--------+--+---------+  Left Carotid Findings: +----------+--------+--------+--------+------------------+--------+             PSV cm/s EDV cm/s Stenosis Plaque Description Comments  +----------+--------+--------+--------+------------------+--------+  CCA Prox   176      18                                             +----------+--------+--------+--------+------------------+--------+  CCA Distal 122      23                                             +----------+--------+--------+--------+------------------+--------+  ICA  Prox   140      27       1-39%    heterogenous                 +----------+--------+--------+--------+------------------+--------+  ICA Distal 126      23                                             +----------+--------+--------+--------+------------------+--------+  ECA        122      13                                             +----------+--------+--------+--------+------------------+--------+ +----------+--------+--------+----------------+-------------------+  PSV cm/s EDV cm/s Describe         Arm Pressure (mmHG)  +----------+--------+--------+----------------+-------------------+  Subclavian 193               Multiphasic, WNL                      +----------+--------+--------+----------------+-------------------+ +---------+--------+--+--------+--+---------+  Vertebral PSV cm/s 45 EDV cm/s 10 Antegrade  +---------+--------+--+--------+--+---------+  Summary: Right Carotid: Velocities in the right ICA are consistent with a 80-99%                stenosis. Left Carotid: Velocities in the left ICA are consistent with a 1-39% stenosis. Vertebrals:  Bilateral vertebral arteries demonstrate antegrade flow. Subclavians: Normal flow hemodynamics were seen in bilateral subclavian              arteries. *See table(s) above for measurements and observations.  Electronically signed by Antony Contras MD on 07/11/2019 at 3:31:47 PM.    Final      Labs:   Basic Metabolic Panel: Recent Labs  Lab 07/10/19 1528 07/10/19 1541  NA 138 139  K 3.1* 3.1*  CL 102 101  CO2 25  --   GLUCOSE 111* 108*  BUN 14 15  CREATININE 1.42* 1.50*  CALCIUM 9.0  --    GFR Estimated Creatinine Clearance: 35.1 mL/min (A) (by C-G formula based on SCr of 1.5 mg/dL (H)). Liver Function Tests: Recent Labs  Lab 07/10/19 1528  AST 23  ALT 14  ALKPHOS 71  BILITOT 0.5  PROT 6.8  ALBUMIN 3.7   No results for input(s): LIPASE, AMYLASE in the last 168 hours. No results for input(s): AMMONIA in the last 168  hours. Coagulation profile Recent Labs  Lab 07/10/19 1528  INR 1.1    CBC: Recent Labs  Lab 07/10/19 1528 07/10/19 1541  WBC 6.0  --   NEUTROABS 3.4  --   HGB 11.2* 11.9*  HCT 34.0* 35.0*  MCV 93.7  --   PLT 186  --    Cardiac Enzymes: No results for input(s): CKTOTAL, CKMB, CKMBINDEX, TROPONINI in the last 168 hours. BNP: Invalid input(s): POCBNP CBG: Recent Labs  Lab 07/10/19 1532  GLUCAP 105*   D-Dimer No results for input(s): DDIMER in the last 72 hours. Hgb A1c Recent Labs    07/11/19 0429  HGBA1C 5.5   Lipid Profile Recent Labs    07/11/19 0429  CHOL 185  HDL 52  LDLCALC 120*  TRIG 65  CHOLHDL 3.6   Thyroid function studies No results for input(s): TSH, T4TOTAL, T3FREE, THYROIDAB in the last 72 hours.  Invalid input(s): FREET3 Anemia work up No results for input(s): VITAMINB12, FOLATE, FERRITIN, TIBC, IRON, RETICCTPCT in the last 72 hours. Microbiology Recent Results (from the past 240 hour(s))  SARS Coronavirus 2 [facility] order, Performed in [facility] hospital lab) Nasopharyngeal Urine, Clean Catch     Status: None   Collection Time: 07/10/19  5:05 PM   Specimen: Urine, Clean Catch; Nasopharyngeal  Result Value Ref Range Status   SARS Coronavirus 2 NEGATIVE NEGATIVE Final    Comment: (NOTE) If result is NEGATIVE SARS-CoV-2 target nucleic acids are NOT DETECTED. The SARS-CoV-2 RNA is generally detectable in upper and lower  respiratory specimens during the acute phase of infection. The lowest  concentration of SARS-CoV-2 viral copies this assay can detect is 250  copies / mL. A negative result does not preclude SARS-CoV-2 infection  and should not be used as the sole basis  for treatment or other  patient management decisions.  A negative result may occur with  improper specimen collection / handling, submission of specimen other  than nasopharyngeal swab, presence of viral mutation(s) within the  areas targeted by this assay, and  inadequate number of viral copies  (<250 copies / mL). A negative result must be combined with clinical  observations, patient history, and epidemiological information. If result is POSITIVE SARS-CoV-2 target nucleic acids are DETECTED. The SARS-CoV-2 RNA is generally detectable in upper and lower  respiratory specimens dur ing the acute phase of infection.  Positive  results are indicative of active infection with SARS-CoV-2.  Clinical  correlation with patient history and other diagnostic information is  necessary to determine patient infection status.  Positive results do  not rule out bacterial infection or co-infection with other viruses. If result is PRESUMPTIVE POSTIVE SARS-CoV-2 nucleic acids MAY BE PRESENT.   A presumptive positive result was obtained on the submitted specimen  and confirmed on repeat testing.  While 2019 novel coronavirus  (SARS-CoV-2) nucleic acids may be present in the submitted sample  additional confirmatory testing may be necessary for epidemiological  and / or clinical management purposes  to differentiate between  SARS-CoV-2 and other Sarbecovirus currently known to infect humans.  If clinically indicated additional testing with an alternate test  methodology [phone]) is advised. The SARS-CoV-2 RNA is generally  detectable in upper and lower respiratory sp ecimens during the acute  phase of infection. The expected result is Negative. Fact Sheet for Patients:  StrictlyIdeas.no Fact Sheet for Healthcare Providers: BankingDealers.co.za This test is not yet approved or cleared by the [place] FDA and has been authorized for detection and/or diagnosis of SARS-CoV-2 by FDA under an Emergency Use Authorization (EUA).  This EUA will remain in effect (meaning this test can be used) for the duration of the COVID-19 declaration under Section 564(b)(1) of the Act, 21 U.S.C. section 360bbb-3(b)(1), unless the  authorization is terminated or revoked sooner. Performed at [facility], [place] [address], [place], [place] [address]      Discharge Instructions:   Discharge Instructions    Ambulatory referral to Neurology   Complete by: As directed    An appointment is requested in approximately: 6 weeks   Diet - low sodium heart healthy   Complete by: As directed    Discharge instructions   Complete by: As directed    24 hour supervision Continue DAPT for 3 months and then ASA alone SBP goal 130-150 given R ICA stenosis before CEA   Increase activity slowly   Complete by: As directed      Allergies as of 07/12/2019      Reactions   Eggs Or Egg-derived Products    Sick   Doxycycline Hives      Medication List    STOP taking these medications   chlorthalidone 25 MG tablet Commonly known as: HYGROTON     TAKE these medications   amLODipine 5 MG tablet Commonly known as: NORVASC Take 1 tablet by mouth once daily   aspirin 81 MG EC tablet Take 1 tablet (81 mg total) by mouth daily. Start taking on: July 13, 2019   atorvastatin 40 MG tablet Commonly known as: LIPITOR Take 1 tablet (40 mg total) by mouth daily at 6 PM.   clopidogrel 75 MG tablet Commonly known as: PLAVIX Take 1 tablet (75 mg total) by mouth daily. Start taking on: July 13, 2019   Creon 36000 UNITS  Cpep capsule Generic drug: lipase/protease/amylase Take 36,000 Units by mouth 3 (three) times daily with meals.   eucerin cream APPLY TWICE DAILY AS NEEDED FOR RASH OR ITCHING What changed: See the new instructions.   fluticasone 50 MCG/ACT nasal spray Commonly known as: FLONASE 2 sprays each nostril after sinus rinse   NP Thyroid 60 MG tablet Generic drug: thyroid TAKE 1 TABLET BY MOUTH ONCE DAILY BEFORE BREAKFAST What changed: See the new instructions.   sertraline 25 MG tablet Commonly known as: ZOLOFT Take 1 tablet by mouth once daily   triamcinolone 0.1 % cream : eucerin Crea Apply 1  application topically 2 (two) times daily as needed. What changed: reasons to take this   Vitamin D (Ergocalciferol) 1.25 MG (50000 UT) Caps capsule Commonly known as: DRISDOL Take 1 capsule (50,000 Units total) by mouth every 7 (seven) days.   Zegerid 20-1100 MG Caps capsule Generic drug: Omeprazole-Sodium Bicarbonate Take 1 capsule by mouth daily before breakfast.         Time coordinating discharge: 35 min  Signed:  Geradine Girt DO  [facility] 07/12/2019, 10:31 AM

## 2019-07-12 NOTE — Progress Notes (Signed)
Patient ID: Christian Bennett, male   DOB: December 20, 1927, 85 y.o.   MRN: [id number] Discussed again with the patient and his daughter present.  The patient does wish to proceed with right carotid endarterectomy for critical right carotid stenosis.  Okay for discharge home today and we will coordinate this within the next 1 to 2 weeks

## 2019-07-12 NOTE — Progress Notes (Signed)
Patient being discharged home with 24hr supervision. Education and information given to patient and pt daughter at bedside. CCMD notified about discharge. Pt leaving unit via wheelchair

## 2019-07-12 NOTE — TOC Transition Note (Signed)
Transition of Care [facility]) - CM/SW Discharge Note   Patient Details  Name: Christian Bennett MRN: [id number] Date of Birth: 06/03/1928  [facility] CM/SW Contact:  Pollie Friar, RN Phone Number: 07/12/2019, 11:00 AM   Clinical Narrative:    Pt discharging home with self care. No f/u per PT/OT and no DME needs.  Daughters able to provided needed supervision and transportation at home.  Daughter at the bedside to provide transportation to home.   Final next level of care: Home/Self Care Barriers to Discharge: No Barriers Identified   Patient Goals and CMS Choice        Discharge Placement                       Discharge Plan and Services                                     Social Determinants of Health (SDOH) Interventions     Readmission Risk Interventions No flowsheet data found.

## 2019-07-12 NOTE — Progress Notes (Signed)
STROKE TEAM PROGRESS NOTE   INTERVAL HISTORY Pt neuro stable. Left hand back to baseline as per pt. He will have right CEA with Dr. Donnetta Hutching in 1-2 weeks.    Vitals:   07/11/19 1919 07/11/19 2316 07/12/19 0819 07/12/19 1232  BP: 140/62 133/69 (!) 132/58 (!) 149/60  Pulse: 69 61 66 63  Resp: 18 18 20 20   Temp: (!) 97.5 F (36.4 C) 97.8 F (36.6 C) (!) 97.5 F (36.4 C)   TempSrc: Oral Oral Oral   SpO2: 99% 99% 98% 100%  Weight:      Height:        CBC:  Recent Labs  Lab 07/10/19 1528 07/10/19 1541  WBC 6.0  --   NEUTROABS 3.4  --   HGB 11.2* 11.9*  HCT 34.0* 35.0*  MCV 93.7  --   PLT 186  --     Basic Metabolic Panel:  Recent Labs  Lab 07/10/19 1528 07/10/19 1541  NA 138 139  K 3.1* 3.1*  CL 102 101  CO2 25  --   GLUCOSE 111* 108*  BUN 14 15  CREATININE 1.42* 1.50*  CALCIUM 9.0  --    Lipid Panel:     Component Value Date/Time   CHOL 185 07/11/2019 0429   CHOL 146 06/23/2017 0915   TRIG 65 07/11/2019 0429   HDL 52 07/11/2019 0429   HDL 51 06/23/2017 0915   CHOLHDL 3.6 07/11/2019 0429   VLDL 13 07/11/2019 0429   LDLCALC 120 (H) 07/11/2019 0429   LDLCALC 82 06/23/2017 0915   HgbA1c:  Lab Results  Component Value Date   HGBA1C 5.5 07/11/2019   Urine Drug Screen:     Component Value Date/Time   LABOPIA NONE DETECTED 07/10/2019 1705   COCAINSCRNUR NONE DETECTED 07/10/2019 1705   LABBENZ NONE DETECTED 07/10/2019 1705   AMPHETMU NONE DETECTED 07/10/2019 1705   THCU NONE DETECTED 07/10/2019 1705   LABBARB NONE DETECTED 07/10/2019 1705    Alcohol Level No results found for: [facility]  IMAGING Mr Angio Head Wo Contrast  Result Date: 07/11/2019 CLINICAL DATA:  Stroke follow-up EXAM: MRA HEAD WITHOUT CONTRAST TECHNIQUE: Angiographic images of the Circle of Willis were obtained using MRA technique without intravenous contrast. COMPARISON:  None. FINDINGS: POSTERIOR CIRCULATION: --Vertebral arteries: Normal V4 segments. --Posterior inferior cerebellar arteries  (PICA): Patent origins from the vertebral arteries. --Anterior inferior cerebellar arteries (AICA): Patent origins from the basilar artery. --Basilar artery: Normal. --Superior cerebellar arteries: Normal. --Posterior cerebral arteries: Multifocal severe stenosis of the right PCA P3 segment. No hemodynamically significant stenosis of the left PCA. ANTERIOR CIRCULATION: --Intracranial internal carotid arteries: Normal. --Anterior cerebral arteries (ACA): Normal. Both A1 segments are present. Patent anterior communicating artery (a-comm). --Middle cerebral arteries (MCA): Normal. IMPRESSION: 1. No proximal large vessel occlusion or high-grade stenosis. 2. Severe stenosis of the right PCA P3 segment. Electronically Signed   By: Ulyses Jarred M.D.   On: 07/11/2019 03:51   Mr Angio Neck W Wo Contrast  Result Date: 07/10/2019 CLINICAL DATA:  Left facial droop EXAM: MRA NECK WITHOUT AND WITH CONTRAST TECHNIQUE: Multiplanar and multiecho pulse sequences of the neck were obtained without and with intravenous contrast. Angiographic images of the neck were obtained using MRA technique without and with intravenous contrast. CONTRAST:  59mL GADAVIST GADOBUTROL 1 MMOL/ML IV SOLN COMPARISON:  None. FINDINGS: Aortic arch: Normal 3 vessel aortic branching pattern. The visualized subclavian arteries are normal. Right carotid system: There is severe stenosis or short segment occlusion of the  proximal right ICA with loss of contrast enhancement and flow related enhancement. There is normal enhancement of the distal ICA. Left carotid system: Normal course and caliber without stenosis or evidence of dissection. Vertebral arteries: Codominant. Vertebral artery origins are normal. Vertebral arteries are normal in course and caliber to the vertebrobasilar confluence without stenosis or evidence of dissection. IMPRESSION: 1. Severe stenosis versus short segment occlusion of the proximal right ICA. 2. No other hemodynamically significant  stenosis of the carotid or vertebral arteries. Electronically Signed   By: Ulyses Jarred M.D.   On: 07/10/2019 21:26   Mr Brain Wo Contrast  Result Date: 07/10/2019 CLINICAL DATA:  Left-sided weakness EXAM: MRI HEAD WITHOUT CONTRAST TECHNIQUE: Multiplanar, multiecho pulse sequences of the brain and surrounding structures were obtained without intravenous contrast. COMPARISON:  None. FINDINGS: BRAIN: Multiple small foci of acute ischemia within the right hemisphere, in the right frontal and parietal lobes. The abnormalities are concentrated along the precentral and postcentral gyri. No contralateral ischemia. Diffuse confluent hyperintense T2-weighted signal within the periventricular, deep and juxtacortical white matter, most commonly due to chronic ischemic microangiopathy. There are old right frontal and right occipital lobe infarct. There is generalized atrophy without lobar predilection. The midline structures are normal. VASCULAR: The major intracranial arterial and venous sinus flow voids are normal. Faint hemosiderin deposition at the old right frontal infarct site. SKULL AND UPPER CERVICAL SPINE: Calvarial bone marrow signal is normal. There is no skull base mass. The visualized upper cervical spine and soft tissues are normal. SINUSES/ORBITS: There are no fluid levels or advanced mucosal thickening. The mastoid air cells and middle ear cavities are free of fluid. The orbits are normal. IMPRESSION: 1. Multiple small foci of acute ischemia within the right hemisphere, concentrated along the precentral and postcentral gyri. This is in keeping with left-sided symptoms. 2. No hemorrhage or mass effect. 3. Old right frontal and right occipital lobe infarcts. 4. Severe chronic small vessel ischemic microangiopathy. Electronically Signed   By: Ulyses Jarred M.D.   On: 07/10/2019 21:21   Vas US Carotid (at [place] Only)  Result Date: 07/11/2019 Carotid Arterial Duplex Study Indications:       TIA. Risk  Factors:      Hypertension. Other Factors:     MRA Right ICA severe stenosis. Comparison Study:  no prior Performing Technologist: June Leap RDMS, RVT  Examination Guidelines: A complete evaluation includes B-mode imaging, spectral Doppler, color Doppler, and power Doppler as needed of all accessible portions of each vessel. Bilateral testing is considered an integral part of a complete examination. Limited examinations for reoccurring indications may be performed as noted.  Right Carotid Findings: +----------+-------+-------+--------+---------------------------------+--------+           PSV    EDV    StenosisPlaque Description               Comments           cm/s   cm/s                                                     +----------+-------+-------+--------+---------------------------------+--------+ CCA Prox  107    8                                                        +----------+-------+-------+--------+---------------------------------+--------+  CCA Distal78     13                                                       +----------+-------+-------+--------+---------------------------------+--------+ ICA Prox  699    238    80-99%  irregular, heterogenous and                                               calcific                                  +----------+-------+-------+--------+---------------------------------+--------+ ICA Mid   229    51                                                       +----------+-------+-------+--------+---------------------------------+--------+ ICA Distal26     10                                                       +----------+-------+-------+--------+---------------------------------+--------+ ECA       68     4                                                        +----------+-------+-------+--------+---------------------------------+--------+ +----------+--------+-------+----------------+-------------------+            PSV cm/sEDV cmsDescribe        Arm Pressure (mmHG) +----------+--------+-------+----------------+-------------------+ [id number]            Multiphasic, WNL                    +----------+--------+-------+----------------+-------------------+ +---------+--------+--+--------+--+---------+ VertebralPSV cm/s69EDV cm/s14Antegrade +---------+--------+--+--------+--+---------+  Left Carotid Findings: +----------+--------+--------+--------+------------------+--------+           PSV cm/sEDV cm/sStenosisPlaque DescriptionComments +----------+--------+--------+--------+------------------+--------+ CCA Prox  176     18                                         +----------+--------+--------+--------+------------------+--------+ CCA Distal122     23                                         +----------+--------+--------+--------+------------------+--------+ ICA Prox  140     27      1-39%   heterogenous               +----------+--------+--------+--------+------------------+--------+ ICA Distal126     23                                         +----------+--------+--------+--------+------------------+--------+  ECA       122     13                                         +----------+--------+--------+--------+------------------+--------+ +----------+--------+--------+----------------+-------------------+           PSV cm/sEDV cm/sDescribe        Arm Pressure (mmHG) +----------+--------+--------+----------------+-------------------+ [id number]             Multiphasic, WNL                    +----------+--------+--------+----------------+-------------------+ +---------+--------+--+--------+--+---------+ VertebralPSV cm/s45EDV cm/s10Antegrade +---------+--------+--+--------+--+---------+  Summary: Right Carotid: Velocities in the right ICA are consistent with a 80-99%                stenosis. Left Carotid: Velocities in the left ICA are  consistent with a 1-39% stenosis. Vertebrals:  Bilateral vertebral arteries demonstrate antegrade flow. Subclavians: Normal flow hemodynamics were seen in bilateral subclavian              arteries. *See table(s) above for measurements and observations.  Electronically signed by Antony Contras MD on 07/11/2019 at 3:31:47 PM.    Final     PHYSICAL EXAM    Temp:  [97.5 F (36.4 C)-97.8 F (36.6 C)] 97.5 F (36.4 C) (10/08 0819) Pulse Rate:  [61-69] 63 (10/08 1232) Resp:  [18-20] 20 (10/08 1232) BP: (132-149)/(58-69) 149/60 (10/08 1232) SpO2:  [98 %-100 %] 100 % (10/08 1232)  General - Well nourished, well developed, in no apparent distress.  Ophthalmologic - fundi not visualized due to noncooperation.  Cardiovascular - Regular rhythm and rate.  Mental Status -  Level of arousal and orientation to time, place, and person were intact. Language including expression, naming, repetition, comprehension was assessed and found intact. Fund of Knowledge was assessed and was intact.  Cranial Nerves II - XII - II - Visual field intact OU. III, IV, VI - Extraocular movements intact. V - Facial sensation intact bilaterally. VII - Facial movement intact bilaterally. VIII - Hard of hearing & vestibular intact bilaterally. X - Palate elevates symmetrically. XI - Chin turning & shoulder shrug intact bilaterally. XII - Tongue protrusion intact.  Motor Strength - The patient's strength was normal in all extremities and pronator drift was absent.  Bulk was normal and fasciculations were absent.   Motor Tone - Muscle tone was assessed at the neck and appendages and was normal.  Reflexes - The patient's reflexes were symmetrical in all extremities and he had no pathological reflexes.  Sensory - Light touch, temperature/pinprick were assessed and were symmetrical.    Coordination - The patient had normal movements in the hands and feet with no ataxia or dysmetria.  Tremor was absent.  Gait and  Station - deferred.   ASSESSMENT/PLAN Christian Bennett is a 85 y.o. male with history of HTN and alcohol use presenting with slurry speech, L facial droop (resolved en route) and L sided weakness.   Stroke: Multiple R brain infarct in setting of severe R ICA stenosis, thromboembolic secondary to largel vessel disease source  Code Stroke CT head No acute abnormality. Small vessel disease. Atrophy. ASPECTS 10.     MRI  Multiple foci acute R brain infarcts. Old R frontal and occipital lobe infarcts. Severe small vessel disease.    MRA head severe R P3 stenosis   MRA neck severe stenosis prox R ICA  Carotid Doppler  R ICA 80-99% stenosis   2D Echo EF 60-65%  LDL 120  HgbA1c 5.5  Lovenox 40 mg sq daily for VTE prophylaxis  No antithrombotic prior to admission, now on aspirin 81 mg daily and clopidogrel 75 mg daily following aspirin and plavix load. Continue DAPT for 3 months and then ASA alone.   Therapy recommendations:  No PT, No OT  Disposition:  Return home  Carotid stenosis   MRA neck severe stenosis prox R ICA  Carotid Doppler  R ICA 80-99% stenosis   VVS Dr. Donnetta Hutching on board  Planned right CEA within 1-2 weeks.  Hypertension  Stable . Permissive hypertension (OK if < 220/120) but gradually normalize in 5-7 days . 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 Please call with questions. Pt will follow up with stroke clinic NP at [facility] in about 4 weeks. Thanks for the consult.  Christian Hawking, MD PhD Stroke  Neurology 07/12/2019 4:14 PM    To contact Stroke Continuity provider, please refer to [email or web address] After hours, contact General Neurology

## 2019-07-12 NOTE — Progress Notes (Signed)
Physical Therapy Treatment and Discharge Patient Details Name: Christian Bennett MRN: [id number] DOB: April 30, 1928 Today's Date: 07/12/2019    History of Present Illness Pt is an 85 y/o male admitted secondary to L facial droop, L arm weakness, and difficulty speaking. MRI revealed multiple small foci within R hemispher. PMH includes HTN.     PT Comments    Pt tolerated treatment well, performing all mobility required in the home setting without physical assistance requirements. Per daughter and pt, the pt is at his baseline for mobility and cognition. Pt and daughter feel comfortable with D/C plans for home and family is able to provide 24/7 supervision initially to transition back to the home setting. Pt requires no further acute PT services and has met all goals. Acute PT signing off at this time. Pt HR labile during session, ranging from 60-140s although pt asymptomatic, RN and MD aware.  Follow Up Recommendations  No PT follow up;Supervision/Assistance - 24 hour(24/7 supervision initially)     Equipment Recommendations  None recommended by PT    Recommendations for Other Services       Precautions / Restrictions Precautions Precautions: Fall Restrictions Weight Bearing Restrictions: No    Mobility  Bed Mobility Overal bed mobility: Modified Independent Bed Mobility: Supine to Sit     Supine to sit: Modified independent (Device/Increase time)        Transfers Overall transfer level: Independent Equipment used: None Transfers: Sit to/from Stand Sit to Stand: Independent            Ambulation/Gait Ambulation/Gait assistance: Supervision Gait Distance (Feet): 300 Feet Assistive device: None Gait Pattern/deviations: Step-through pattern;Decreased stride length;Shuffle Gait velocity: Decreased Gait velocity interpretation: <1.8 ft/sec, indicate of risk for recurrent falls General Gait Details: pt able to perform head turns, change gait speed, and turn without  significant LOB   Stairs Stairs: Yes Stairs assistance: Supervision Stair Management: One rail Left;Forwards Number of Stairs: 2(2 trials of 2 steps)     Wheelchair Mobility    Modified Rankin (Stroke Patients Only) Modified Rankin (Stroke Patients Only) Pre-Morbid Rankin Score: No symptoms Modified Rankin: No symptoms     Balance Overall balance assessment: Needs assistance Sitting-balance support: No upper extremity supported;Feet supported Sitting balance-[name]: Normal     Standing balance support: No upper extremity supported Standing balance-[name]: Good Standing balance comment: supervision for dynamic standing balance                            Cognition Arousal/Alertness: Awake/alert Behavior During Therapy: WFL for tasks assessed/performed Overall Cognitive Status: Within Functional Limits for tasks assessed                                 General Comments: dtr reports pt is at baseline      Exercises      General Comments        Pertinent Vitals/Pain Pain Assessment: No/denies pain    Home Living                      Prior Function            PT Goals (current goals can now be found in the care plan section) Acute Rehab PT Goals Patient Stated Goal: to go home Progress towards PT goals: Goals met/education completed, patient discharged from PT    Frequency    Min 4X/week  PT Plan Discharge plan needs to be updated    Co-evaluation              AM-PAC PT "6 Clicks" Mobility   Outcome Measure  Help needed turning from your back to your side while in a flat bed without using bedrails?: None Help needed moving from lying on your back to sitting on the side of a flat bed without using bedrails?: None Help needed moving to and from a bed to a chair (including a wheelchair)?: None Help needed standing up from a chair using your arms (e.g., wheelchair or bedside chair)?: None Help  needed to walk in hospital room?: None Help needed climbing 3-5 steps with a railing? : None 6 Click Score: 24    End of Session Equipment Utilized During Treatment: Gait belt Activity Tolerance: Patient tolerated treatment well Patient left: in chair;with call bell/phone within reach;with family/visitor present Nurse Communication: Mobility status PT Visit Diagnosis: Unsteadiness on feet (R26.81);Other symptoms and signs involving the nervous system (S31.594)     Time: 5859-2924 PT Time Calculation (min) (ACUTE ONLY): 23 min  Charges:  $Gait Training: 23-37 mins                     Zenaida Niece, PT, DPT Acute Rehabilitation Pager: [phone]    Zenaida Niece 07/12/2019, 9:44 AM

## 2019-07-12 NOTE — Evaluation (Signed)
Occupational Therapy Evaluation Patient Details Name: Christian Bennett MRN: [id number] DOB: Mar 23, 1928 Today's Date: 07/12/2019    History of Present Illness Pt is an 85 y/o male admitted secondary to L facial droop, L arm weakness, and difficulty speaking. MRI revealed multiple small foci within R hemispher. PMH includes HTN.    Clinical Impression   Pt PTA: living alone. Pt was independent with ADL and mobility. Pt Pt's family is very supportive- family lives nextdoor and pt is encouraged not to drive, but continues to drive. Family manages medications. Pt HOH at all times. Pt performing ADL with supervisionA.  Pt appears to be performing ADL at functional baseline as no physical assist required. Pt leaning over for LB dressing.  Pt does not require continued OT skilled services. OT signing off.   Follow Up Recommendations  No OT follow up    Equipment Recommendations  None recommended by OT    Recommendations for Other Services       Precautions / Restrictions Precautions Precautions: Fall Restrictions Weight Bearing Restrictions: No      Mobility Bed Mobility Overal bed mobility: Modified Independent Bed Mobility: Supine to Sit     Supine to sit: Modified independent (Device/Increase time) Sit to supine: Supervision   General bed mobility comments: Supervision for safety.  Transfers Overall transfer level: Independent Equipment used: None Transfers: Sit to/from Stand Sit to Stand: Independent              Balance Overall balance assessment: Needs assistance Sitting-balance support: No upper extremity supported;Feet supported Sitting balance-[name]: Normal     Standing balance support: No upper extremity supported Standing balance-[name]: Good Standing balance comment: supervision for dynamic standing balance at sink                           ADL either performed or assessed with clinical judgement   ADL Overall ADL's : At  baseline                                       General ADL Comments: Pt performing ADL with supervisionA. Pt HOH at all times. Pt appears to be performing ADL at functional baseline as no physical assist required. Pt leaning over for LB dressing.     Vision Baseline Vision/History: No visual deficits Vision Assessment?: No apparent visual deficits     Perception     Praxis      Pertinent Vitals/Pain Pain Assessment: No/denies pain     Hand Dominance Right   Extremity/Trunk Assessment Upper Extremity Assessment Upper Extremity Assessment: Generalized weakness   Lower Extremity Assessment Lower Extremity Assessment: Generalized weakness   Cervical / Trunk Assessment Cervical / Trunk Assessment: Normal   Communication Communication Communication: No difficulties   Cognition Arousal/Alertness: Awake/alert Behavior During Therapy: WFL for tasks assessed/performed Overall Cognitive Status: Within Functional Limits for tasks assessed                                 General Comments: dtr reports pt is at baseline   General Comments  Pt's daughter present.    Exercises     Shoulder Instructions      Home Living Family/patient expects to be discharged to:: Private residence Living Arrangements: Alone Available Help at Discharge: Family;Available PRN/intermittently Type of Home: House Home Access:  Stairs to enter [employer] of Steps: 4 Entrance Stairs-Rails: Right Home Layout: One level     Bathroom Shower/Tub: Walk-in shower;Tub/shower unit   Biochemist, clinical: Standard     Home Equipment: None      Lives With: Alone    Prior Functioning/Environment Level of Independence: Independent        Comments: Pt's family is very supportive- family lives nextdoor and pt is encouraged not to drive, but continues to drive. Family manages medications.        OT Problem List: Decreased activity tolerance      OT  Treatment/Interventions:      OT Goals(Current goals can be found in the care plan section) Acute Rehab OT Goals Patient Stated Goal: to go home OT Goal Formulation: With patient  OT Frequency:     Barriers to D/C:            Co-evaluation              AM-PAC OT "6 Clicks" Daily Activity     Outcome Measure Help from another person eating meals?: None Help from another person taking care of personal grooming?: None Help from another person toileting, which includes using toliet, bedpan, or urinal?: None Help from another person bathing (including washing, rinsing, drying)?: A Little Help from another person to put on and taking off regular upper body clothing?: None Help from another person to put on and taking off regular lower body clothing?: None 6 Click Score: 23   End of Session Equipment Utilized During Treatment: Gait belt Nurse Communication: Mobility status  Activity Tolerance: Patient tolerated treatment well Patient left: in bed;with call bell/phone within reach  OT Visit Diagnosis: Unsteadiness on feet (R26.81);Muscle weakness (generalized) (M62.81)                Time: [id number] OT Time Calculation (min): 17 min Charges:  OT General Charges $OT Visit: 1 Visit OT Evaluation $OT Eval Moderate Complexity: 1 Mod  Christian Bennett) Christian Bennett Acute Rehabilitation Services Pager: [phone] Office: [phone]   Christian Bennett 07/12/2019, 1:18 PM

## 2019-07-13 ENCOUNTER — Telehealth: Payer: Self-pay | Admitting: *Deleted

## 2019-07-13 ENCOUNTER — Other Ambulatory Visit: Payer: Self-pay | Admitting: *Deleted

## 2019-07-13 NOTE — Telephone Encounter (Signed)
Voice mail left for patient/family to call this office today to discuss scheduling surgery with Dr. Donnetta Hutching .

## 2019-07-13 NOTE — Telephone Encounter (Signed)
Spoke with patient's daughter Judson Roch. Instructed to be at [facility] admitting at 6:30 am on 07/25/2019 for surgery with Dr. Donnetta Hutching.Continue ASA and Plavix. NPO past MN night prior. Expect a call and follow the detailed instructions for surgery and pre-op nasal swab testing received from the hospital preadmission department. To call this office if any questions.

## 2019-07-20 NOTE — Progress Notes (Signed)
[facility] ([address]), [place] [id number] [place] ([place]) [place] [address] Phone: [phone] Fax: [phone]      Your procedure is scheduled on Wednesday 07/25/2019.  Report to [facility] Main Entrance "A" at [place] and check in at the Admitting office.  Call this number if you have problems the morning of surgery:  [phone]  Call [phone] if you have any questions prior to your surgery date Monday-Friday 8am-4pm    Remember:  Do not eat or drink after midnight the night before your surgery    Take these medicines the morning of surgery with A SIP OF: Amlodipine (Norvas) Fluticasone (Flonase) NP Thyroid Omeprazole-sodium Bicarbonate (zegerid) Sertraline (Zoloft)   7 days prior to surgery STOP taking any Aleve, Naproxen, Ibuprofen, Motrin, Advil, Goody's, BC's, all herbal medications, fish oil, and all vitamins.\  Per Dr. Luther Parody office, continue your Clopidogrel (Plavix) and Aspirin as instructed.    The Morning of Surgery  Do not wear jewelry.  Do not wear lotions, powders, colognes, or deodorant  Do not shave 48 hours prior to surgery.  Men may shave face and neck.  Do not bring valuables to the hospital.  [facility] is not responsible for any belongings or valuables.  If you are a smoker, DO NOT Smoke 24 hours prior to surgery  If you wear a CPAP at night please bring your mask, tubing, and machine the morning of surgery   Remember that you must have someone to transport you home after your surgery, and remain with you for 24 hours if you are discharged the same day.   Contacts, glasses, hearing aids, dentures or bridgework may not be worn into surgery.    Leave your suitcase in the car.  After surgery it may be brought to your room.  For patients admitted to the hospital, discharge time will be determined by your treatment team.  Patients discharged the day of surgery will not be allowed to drive home.     Special instructions:   [facility]- Preparing For Surgery  Before surgery, you can play an important role. Because skin is not sterile, your skin needs to be as free of germs as possible. You can reduce the number of germs on your skin by washing with CHG (chlorahexidine gluconate) Soap before surgery.  CHG is an antiseptic cleaner which kills germs and bonds with the skin to continue killing germs even after washing.    Oral Hygiene is also important to reduce your risk of infection.  Remember - BRUSH YOUR TEETH THE MORNING OF SURGERY WITH YOUR REGULAR TOOTHPASTE  Please do not use if you have an allergy to CHG or antibacterial soaps. If your skin becomes reddened/irritated stop using the CHG.  Do not shave (including legs and underarms) for at least 48 hours prior to first CHG shower. It is OK to shave your face.  Please follow these instructions carefully.   1. Shower the NIGHT BEFORE SURGERY and the MORNING OF SURGERY with CHG Soap.   2. If you chose to wash your hair, wash your hair first as usual with your normal shampoo.  3. After you shampoo, rinse your hair and body thoroughly to remove the shampoo.  4. Use CHG as you would any other liquid soap. You can apply CHG directly to the skin and wash gently with a scrungie or a clean washcloth.   5. Apply the CHG Soap to your body ONLY FROM THE  NECK DOWN.  Do not use on open wounds or open sores. Avoid contact with your eyes, ears, mouth and genitals (private parts). Wash Face and genitals (private parts)  with your normal soap.   6. Wash thoroughly, paying special attention to the area where your surgery will be performed.  7. Thoroughly rinse your body with warm water from the neck down.  8. DO NOT shower/wash with your normal soap after using and rinsing off the CHG Soap.  9. Pat yourself dry with a CLEAN TOWEL.  10. Wear CLEAN PAJAMAS to bed the night before surgery, wear comfortable clothes the morning of  surgery  11. Place CLEAN SHEETS on your bed the night of your first shower and DO NOT SLEEP WITH PETS.    Day of Surgery:  Please shower the morning of surgery with the CHG soap Do not apply any deodorants/lotions.  Please wear clean clothes to the hospital/surgery center.   Remember to brush your teeth WITH YOUR REGULAR TOOTHPASTE.   Please read over the following fact sheets that you were given.

## 2019-07-23 ENCOUNTER — Other Ambulatory Visit (HOSPITAL_COMMUNITY)
Admission: RE | Admit: 2019-07-23 | Discharge: 2019-07-23 | Disposition: A | Discharge status: Home / Self Care | Payer: Medicare Other | Source: Ambulatory Visit | Attending: Vascular Surgery | Admitting: Vascular Surgery

## 2019-07-23 ENCOUNTER — Other Ambulatory Visit: Payer: Self-pay

## 2019-07-23 ENCOUNTER — Encounter (HOSPITAL_COMMUNITY): Payer: Self-pay

## 2019-07-23 ENCOUNTER — Encounter (HOSPITAL_COMMUNITY)
Admission: RE | Admit: 2019-07-23 | Discharge: 2019-07-23 | Disposition: A | Discharge status: Home / Self Care | Payer: Medicare Other | Source: Ambulatory Visit | Attending: Vascular Surgery | Admitting: Vascular Surgery

## 2019-07-23 DIAGNOSIS — I639 Cerebral infarction, unspecified: Secondary | ICD-10-CM | POA: Insufficient documentation

## 2019-07-23 DIAGNOSIS — Z01812 Encounter for preprocedural laboratory examination: Secondary | ICD-10-CM | POA: Insufficient documentation

## 2019-07-23 LAB — SARS CORONAVIRUS 2 (TAT 6-24 HRS): SARS Coronavirus 2: NEGATIVE

## 2019-07-23 LAB — URINALYSIS, ROUTINE W REFLEX MICROSCOPIC
Bilirubin Urine: NEGATIVE
Glucose, UA: NEGATIVE mg/dL
Hgb urine dipstick: NEGATIVE
Ketones, ur: NEGATIVE mg/dL
Nitrite: NEGATIVE
Protein, ur: NEGATIVE mg/dL
Specific Gravity, Urine: 1.021 (ref 1.005–1.030)
pH: 5 (ref 5.0–8.0)

## 2019-07-23 LAB — COMPREHENSIVE METABOLIC PANEL
ALT: 16 U/L (ref 0–44)
AST: 23 U/L (ref 15–41)
Albumin: 3.8 g/dL (ref 3.5–5.0)
Alkaline Phosphatase: 74 U/L (ref 38–126)
Anion gap: 7 (ref 5–15)
BUN: 10 mg/dL (ref 8–23)
CO2: 26 mmol/L (ref 22–32)
Calcium: 9 mg/dL (ref 8.9–10.3)
Chloride: 104 mmol/L (ref 98–111)
Creatinine, Ser: 1.31 mg/dL — ABNORMAL HIGH (ref 0.61–1.24)
GFR calc Af Amer: 56 mL/min — ABNORMAL LOW (ref >60)
GFR calc non Af Amer: 48 mL/min — ABNORMAL LOW (ref >60)
Glucose, Bld: 97 mg/dL (ref 70–99)
Potassium: 3.7 mmol/L (ref 3.5–5.1)
Sodium: 137 mmol/L (ref 135–145)
Total Bilirubin: 0.6 mg/dL (ref 0.3–1.2)
Total Protein: 6.9 g/dL (ref 6.5–8.1)

## 2019-07-23 LAB — CBC
HCT: 36.5 % — ABNORMAL LOW (ref 39.0–52.0)
Hemoglobin: 11.3 g/dL — ABNORMAL LOW (ref 13.0–17.0)
MCH: 29.5 pg (ref 26.0–34.0)
MCHC: 31 g/dL (ref 30.0–36.0)
MCV: 95.3 fL (ref 80.0–100.0)
Platelets: 205 10*3/uL (ref 150–400)
RBC: 3.83 MIL/uL — ABNORMAL LOW (ref 4.22–5.81)
RDW: 13.2 % (ref 11.5–15.5)
WBC: 6.8 10*3/uL (ref 4.0–10.5)
nRBC: 0 % (ref 0.0–0.2)

## 2019-07-23 LAB — PROTIME-INR
INR: 1.1 (ref 0.8–1.2)
Prothrombin Time: 14.1 seconds (ref 11.4–15.2)

## 2019-07-23 LAB — APTT: aPTT: 29 seconds (ref 24–36)

## 2019-07-23 LAB — SURGICAL PCR SCREEN
MRSA, PCR: NEGATIVE
Staphylococcus aureus: POSITIVE — AB

## 2019-07-23 NOTE — Progress Notes (Signed)
Patient will need to have daughter Christian Bennett come back to the pre-op area due to memory impairment and hearing problems.  The patient can not answer questions correctly.  Patients daughter is also bring a copy of the healthcare POA day of surgery

## 2019-07-23 NOTE — Progress Notes (Signed)
PCP - Mellody Dance Cardiologist - denies  Chest x-ray - not needed EKG - 07/10/19 Stress Test - denies ECHO - 07/11/19 Cardiac Cath - denies  Blood Thinner Instructions: Plavix continue Aspirin Instructions:continue   COVID TEST- 10/19   Anesthesia review: yes, recent TIA  Patient denies shortness of breath, fever, cough and chest pain at PAT appointment   All instructions explained to the patient, with a verbal understanding of the material. Patient agrees to go over the instructions while at home for a better understanding. Patient also instructed to self quarantine after being tested for COVID-19. The opportunity to ask questions was provided.

## 2019-07-23 NOTE — Progress Notes (Signed)
   07/23/19 1254  Complete When Order for Surgery Written:  Pre-op Surgical PCR Testing (All Areas )  Prescription to Pharmacy and Patient Notified.. Yes  Swab Result Positive? Staph Aureus  Patient Notified? Yes (prescrpition called in at [facility] [id number])  Date Notified 07/23/19

## 2019-07-24 NOTE — Progress Notes (Signed)
Patient needs repeat type and screen DOS due to antibodies.

## 2019-07-24 NOTE — Anesthesia Preprocedure Evaluation (Addendum)
Anesthesia Evaluation  Patient identified by MRN, date of birth, ID band Patient awake    Airway Mallampati: II  TM Distance: >3 FB     Dental   Pulmonary former smoker,    breath sounds clear to auscultation       Cardiovascular hypertension,  Rhythm:Regular     Neuro/Psych    GI/Hepatic Neg liver ROS, GERD  ,  Endo/Other  Hypothyroidism   Renal/GU negative Renal ROS     Musculoskeletal   Abdominal   Peds  Hematology   Anesthesia Other Findings   Reproductive/Obstetrics                           Anesthesia Physical Anesthesia Plan  ASA: III  Anesthesia Plan: General   Post-op Pain Management:    Induction: Intravenous  PONV Risk Score and Plan: 2 and Ondansetron and Midazolam  Airway Management Planned: Oral ETT  Additional Equipment: Arterial line  Intra-op Plan:   Post-operative Plan: Possible Post-op intubation/ventilation  Informed Consent:   Plan Discussed with: CRNA and Anesthesiologist  Anesthesia Plan Comments: (Recent admission 10/6-10/8 for 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 MRA neck severe stenosis prox R ICA, Carotid DopplerR ICA 80-99% stenosis, 2D EchoEF 60-65%. Discharged with plan for R CEA 10/21.  TTE 07/11/19:  1. Left ventricular ejection fraction, by visual estimation, is 60 to 65%. The left ventricle has normal function. Normal left ventricular size. Left ventricular septal wall thickness was mildly increased. There is mildly increased left ventricular  hypertrophy.  2. Global right ventricle has normal systolic function.The right ventricular size is normal. No increase in right ventricular wall thickness.  3. Left atrial size was normal.  4. Right atrial size was normal.  5. Mild mitral annular calcification.  6. The mitral  valve is normal in structure. Trace mitral valve regurgitation. No evidence of mitral stenosis.  7. The tricuspid valve is normal in structure. Tricuspid valve regurgitation was not visualized by color flow Doppler.  8. The aortic valve is normal in structure. Aortic valve regurgitation is trivial by color flow Doppler. Mild to moderate aortic valve sclerosis/calcification without any evidence of aortic stenosis.  9. Some calcification extending into the LVOT but no LVOT gradient. 10. The pulmonic valve was normal in structure. Pulmonic valve regurgitation is not visualized by color flow Doppler. 11. Normal pulmonary artery systolic pressure. 12. The inferior vena cava is normal in size with greater than 50% respiratory variability, suggesting right atrial pressure of 3 mmHg.  Carotid US 07/11/19: Summary: Right Carotid: Velocities in the right ICA are consistent with a 80-99% stenosis. Left Carotid: Velocities in the left ICA are consistent with a 1-39% stenosis. Vertebrals:  Bilateral vertebral arteries demonstrate antegrade flow. Subclavians: Normal flow hemodynamics were seen in bilateral subclavian arteries.)      Anesthesia Quick Evaluation

## 2019-07-24 NOTE — Progress Notes (Signed)
Anesthesia Chart Review: Recent admission 10/6-10/8 for 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 MRA neck severe stenosis prox R ICA, Carotid DopplerR ICA 80-99% stenosis, 2D EchoEF 60-65%. Discharged with plan for R CEA 10/21.  TTE 07/11/19:  1. Left ventricular ejection fraction, by visual estimation, is 60 to 65%. The left ventricle has normal function. Normal left ventricular size. Left ventricular septal wall thickness was mildly increased. There is mildly increased left ventricular  hypertrophy.  2. Global right ventricle has normal systolic function.The right ventricular size is normal. No increase in right ventricular wall thickness.  3. Left atrial size was normal.  4. Right atrial size was normal.  5. Mild mitral annular calcification.  6. The mitral valve is normal in structure. Trace mitral valve regurgitation. No evidence of mitral stenosis.  7. The tricuspid valve is normal in structure. Tricuspid valve regurgitation was not visualized by color flow Doppler.  8. The aortic valve is normal in structure. Aortic valve regurgitation is trivial by color flow Doppler. Mild to moderate aortic valve sclerosis/calcification without any evidence of aortic stenosis.  9. Some calcification extending into the LVOT but no LVOT gradient. 10. The pulmonic valve was normal in structure. Pulmonic valve regurgitation is not visualized by color flow Doppler. 11. Normal pulmonary artery systolic pressure. 12. The inferior vena cava is normal in size with greater than 50% respiratory variability, suggesting right atrial pressure of 3 mmHg.  Carotid US 07/11/19: Summary: Right Carotid: Velocities in the right ICA are consistent with a 80-99% stenosis. Left Carotid: Velocities in the left ICA are consistent with a 1-39% stenosis. Vertebrals:  Bilateral vertebral arteries  demonstrate antegrade flow. Subclavians: Normal flow hemodynamics were seen in bilateral subclavian arteries.    Wynonia Musty [facility] Short Stay Center/Anesthesiology Phone [phone] 07/24/2019 10:30 AM

## 2019-07-25 ENCOUNTER — Inpatient Hospital Stay (HOSPITAL_COMMUNITY): Payer: Medicare Other | Admitting: Physician Assistant

## 2019-07-25 ENCOUNTER — Inpatient Hospital Stay (HOSPITAL_COMMUNITY): Payer: Medicare Other | Admitting: Anesthesiology

## 2019-07-25 ENCOUNTER — Encounter (HOSPITAL_COMMUNITY)
Admission: RE | Disposition: A | Discharge status: Home / Self Care | Payer: Self-pay | Source: Ambulatory Visit | Attending: Vascular Surgery

## 2019-07-25 ENCOUNTER — Other Ambulatory Visit: Payer: Self-pay

## 2019-07-25 ENCOUNTER — Inpatient Hospital Stay (HOSPITAL_COMMUNITY)
Admission: RE | Admit: 2019-07-25 | Discharge: 2019-07-26 | DRG: 038 | Disposition: A | Discharge status: Home / Self Care | Payer: Medicare Other | Attending: Vascular Surgery | Admitting: Vascular Surgery

## 2019-07-25 ENCOUNTER — Encounter (HOSPITAL_COMMUNITY): Payer: Self-pay

## 2019-07-25 DIAGNOSIS — F419 Anxiety disorder, unspecified: Secondary | ICD-10-CM | POA: Diagnosis present

## 2019-07-25 DIAGNOSIS — I10 Essential (primary) hypertension: Secondary | ICD-10-CM | POA: Diagnosis present

## 2019-07-25 DIAGNOSIS — Z79899 Other long term (current) drug therapy: Secondary | ICD-10-CM

## 2019-07-25 DIAGNOSIS — Z20828 Contact with and (suspected) exposure to other viral communicable diseases: Secondary | ICD-10-CM | POA: Diagnosis present

## 2019-07-25 DIAGNOSIS — I6521 Occlusion and stenosis of right carotid artery: Principal | ICD-10-CM | POA: Diagnosis present

## 2019-07-25 DIAGNOSIS — Z96653 Presence of artificial knee joint, bilateral: Secondary | ICD-10-CM | POA: Diagnosis present

## 2019-07-25 DIAGNOSIS — K219 Gastro-esophageal reflux disease without esophagitis: Secondary | ICD-10-CM | POA: Diagnosis present

## 2019-07-25 DIAGNOSIS — Z87891 Personal history of nicotine dependence: Secondary | ICD-10-CM | POA: Diagnosis not present

## 2019-07-25 DIAGNOSIS — Z85820 Personal history of malignant melanoma of skin: Secondary | ICD-10-CM

## 2019-07-25 DIAGNOSIS — Z8673 Personal history of transient ischemic attack (TIA), and cerebral infarction without residual deficits: Secondary | ICD-10-CM

## 2019-07-25 DIAGNOSIS — F329 Major depressive disorder, single episode, unspecified: Secondary | ICD-10-CM | POA: Diagnosis present

## 2019-07-25 DIAGNOSIS — E039 Hypothyroidism, unspecified: Secondary | ICD-10-CM | POA: Diagnosis present

## 2019-07-25 DIAGNOSIS — I63231 Cerebral infarction due to unspecified occlusion or stenosis of right carotid arteries: Secondary | ICD-10-CM | POA: Diagnosis not present

## 2019-07-25 DIAGNOSIS — I6782 Cerebral ischemia: Secondary | ICD-10-CM | POA: Diagnosis present

## 2019-07-25 DIAGNOSIS — Z7951 Long term (current) use of inhaled steroids: Secondary | ICD-10-CM

## 2019-07-25 DIAGNOSIS — G459 Transient cerebral ischemic attack, unspecified: Secondary | ICD-10-CM | POA: Diagnosis not present

## 2019-07-25 HISTORY — PX: CAROTID ENDARTERECTOMY: SUR193

## 2019-07-25 HISTORY — DX: Hypothyroidism, unspecified: E03.9

## 2019-07-25 HISTORY — PX: ENDARTERECTOMY: SHX5162

## 2019-07-25 LAB — TYPE AND SCREEN
ABO/RH(D): O POS
Antibody Screen: POSITIVE

## 2019-07-25 SURGERY — ENDARTERECTOMY, CAROTID
Anesthesia: General | Site: Neck | Laterality: Right

## 2019-07-25 MED ORDER — PANCRELIPASE (LIP-PROT-AMYL) 12000-38000 UNITS PO CPEP
36000.0000 [IU] | ORAL_CAPSULE | Freq: Three times a day (TID) | ORAL | Status: DC
  Administered 2019-07-25 – 2019-07-26 (×2): 36000 [IU] via ORAL
  Filled 2019-07-25 (×2): qty 3

## 2019-07-25 MED ORDER — THYROID 60 MG PO TABS
60.0000 mg | ORAL_TABLET | Freq: Every day | ORAL | Status: DC
  Administered 2019-07-26: 10:00:00 60 mg via ORAL
  Filled 2019-07-25: qty 1

## 2019-07-25 MED ORDER — CHLORHEXIDINE GLUCONATE CLOTH 2 % EX PADS
6.0000 | MEDICATED_PAD | Freq: Every day | CUTANEOUS | Status: DC
  Administered 2019-07-25 – 2019-07-26 (×2): 6 via TOPICAL

## 2019-07-25 MED ORDER — ACETAMINOPHEN 325 MG PO TABS
325.0000 mg | ORAL_TABLET | ORAL | Status: DC | PRN
  Administered 2019-07-25 – 2019-07-26 (×2): 650 mg via ORAL
  Filled 2019-07-25 (×2): qty 2

## 2019-07-25 MED ORDER — CLOPIDOGREL BISULFATE 75 MG PO TABS
75.0000 mg | ORAL_TABLET | Freq: Every day | ORAL | Status: DC
  Administered 2019-07-26: 75 mg via ORAL
  Filled 2019-07-25: qty 1

## 2019-07-25 MED ORDER — LIDOCAINE HCL (PF) 1 % IJ SOLN
INTRAMUSCULAR | Status: AC
  Filled 2019-07-25: qty 5

## 2019-07-25 MED ORDER — ACETAMINOPHEN 325 MG RE SUPP: 325.0000 mg | RECTAL | Status: DC | PRN

## 2019-07-25 MED ORDER — METOPROLOL TARTRATE 5 MG/5ML IV SOLN: 2.0000 mg | INTRAVENOUS | Status: DC | PRN

## 2019-07-25 MED ORDER — PROTAMINE SULFATE 10 MG/ML IV SOLN
INTRAVENOUS | Status: DC | PRN
  Administered 2019-07-25: 50 mg via INTRAVENOUS

## 2019-07-25 MED ORDER — 0.9 % SODIUM CHLORIDE (POUR BTL) OPTIME
TOPICAL | Status: DC | PRN
  Administered 2019-07-25: 08:00:00 1000 mL

## 2019-07-25 MED ORDER — LIDOCAINE 2% (20 MG/ML) 5 ML SYRINGE
INTRAMUSCULAR | Status: DC | PRN
  Administered 2019-07-25: 60 mg via INTRAVENOUS

## 2019-07-25 MED ORDER — EPHEDRINE SULFATE-NACL 50-0.9 MG/10ML-% IV SOSY
PREFILLED_SYRINGE | INTRAVENOUS | Status: DC | PRN
  Administered 2019-07-25: 5 mg via INTRAVENOUS
  Administered 2019-07-25: 10 mg via INTRAVENOUS
  Administered 2019-07-25: 5 mg via INTRAVENOUS

## 2019-07-25 MED ORDER — SODIUM CHLORIDE 0.9 % IV SOLN
INTRAVENOUS | Status: AC
  Filled 2019-07-25: qty 1.2

## 2019-07-25 MED ORDER — FLUTICASONE PROPIONATE 50 MCG/ACT NA SUSP
2.0000 | Freq: Every day | NASAL | Status: DC
  Administered 2019-07-26: 10:00:00 2 via NASAL
  Filled 2019-07-25: qty 16

## 2019-07-25 MED ORDER — ROCURONIUM BROMIDE 10 MG/ML (PF) SYRINGE
PREFILLED_SYRINGE | INTRAVENOUS | Status: DC | PRN
  Administered 2019-07-25: 20 mg via INTRAVENOUS
  Administered 2019-07-25: 60 mg via INTRAVENOUS

## 2019-07-25 MED ORDER — LABETALOL HCL 5 MG/ML IV SOLN: 10.0000 mg | INTRAVENOUS | Status: DC | PRN

## 2019-07-25 MED ORDER — TRAMADOL HCL 50 MG PO TABS: 50.0000 mg | ORAL_TABLET | Freq: Three times a day (TID) | ORAL | Status: DC | PRN

## 2019-07-25 MED ORDER — CEFAZOLIN SODIUM-DEXTROSE 2-4 GM/100ML-% IV SOLN
2.0000 g | INTRAVENOUS | Status: AC
  Administered 2019-07-25: 09:00:00 2 g via INTRAVENOUS

## 2019-07-25 MED ORDER — MUPIROCIN 2 % EX OINT
1.0000 "application " | TOPICAL_OINTMENT | Freq: Two times a day (BID) | CUTANEOUS | Status: DC
  Administered 2019-07-25 – 2019-07-26 (×2): 1 via NASAL

## 2019-07-25 MED ORDER — DOCUSATE SODIUM 100 MG PO CAPS
100.0000 mg | ORAL_CAPSULE | Freq: Every day | ORAL | Status: DC
  Administered 2019-07-26: 10:00:00 100 mg via ORAL
  Filled 2019-07-25: qty 1

## 2019-07-25 MED ORDER — ONDANSETRON HCL 4 MG/2ML IJ SOLN: 4.0000 mg | Freq: Four times a day (QID) | INTRAMUSCULAR | Status: DC | PRN

## 2019-07-25 MED ORDER — CHLORHEXIDINE GLUCONATE 4 % EX LIQD: 60.0000 mL | Freq: Once | CUTANEOUS | Status: DC

## 2019-07-25 MED ORDER — SERTRALINE HCL 25 MG PO TABS
25.0000 mg | ORAL_TABLET | Freq: Every day | ORAL | Status: DC
  Administered 2019-07-26: 10:00:00 25 mg via ORAL
  Filled 2019-07-25: qty 1

## 2019-07-25 MED ORDER — MORPHINE SULFATE (PF) 2 MG/ML IV SOLN: 1.0000 mg | INTRAVENOUS | Status: DC | PRN

## 2019-07-25 MED ORDER — HYDRALAZINE HCL 20 MG/ML IJ SOLN: 5.0000 mg | INTRAMUSCULAR | Status: DC | PRN

## 2019-07-25 MED ORDER — DEXAMETHASONE SODIUM PHOSPHATE 10 MG/ML IJ SOLN
INTRAMUSCULAR | Status: DC | PRN
  Administered 2019-07-25: 4 mg via INTRAVENOUS

## 2019-07-25 MED ORDER — PANTOPRAZOLE SODIUM 40 MG PO TBEC
40.0000 mg | DELAYED_RELEASE_TABLET | Freq: Every day | ORAL | Status: DC
  Administered 2019-07-26: 10:00:00 40 mg via ORAL
  Filled 2019-07-25: qty 1

## 2019-07-25 MED ORDER — HEPARIN SODIUM (PORCINE) 1000 UNIT/ML IJ SOLN
INTRAMUSCULAR | Status: DC | PRN
  Administered 2019-07-25: 8000 [IU] via INTRAVENOUS

## 2019-07-25 MED ORDER — CEFAZOLIN SODIUM-DEXTROSE 2-4 GM/100ML-% IV SOLN
INTRAVENOUS | Status: AC
  Filled 2019-07-25: qty 100

## 2019-07-25 MED ORDER — LACTATED RINGERS IV SOLN
INTRAVENOUS | Status: DC | PRN
  Administered 2019-07-25: 08:00:00 via INTRAVENOUS

## 2019-07-25 MED ORDER — MAGNESIUM OXIDE 400 (241.3 MG) MG PO TABS
400.0000 mg | ORAL_TABLET | Freq: Every day | ORAL | Status: DC
  Administered 2019-07-25 – 2019-07-26 (×2): 400 mg via ORAL
  Filled 2019-07-25 (×2): qty 1

## 2019-07-25 MED ORDER — HYDROMORPHONE HCL 1 MG/ML IJ SOLN
0.2500 mg | INTRAMUSCULAR | Status: DC | PRN
  Administered 2019-07-25: 0.5 mg via INTRAVENOUS

## 2019-07-25 MED ORDER — ATORVASTATIN CALCIUM 40 MG PO TABS
40.0000 mg | ORAL_TABLET | Freq: Every day | ORAL | Status: DC
  Administered 2019-07-25: 16:00:00 40 mg via ORAL
  Filled 2019-07-25: qty 1

## 2019-07-25 MED ORDER — FENTANYL CITRATE (PF) 250 MCG/5ML IJ SOLN
INTRAMUSCULAR | Status: DC | PRN
  Administered 2019-07-25 (×3): 50 ug via INTRAVENOUS

## 2019-07-25 MED ORDER — ONDANSETRON HCL 4 MG/2ML IJ SOLN
INTRAMUSCULAR | Status: AC
  Filled 2019-07-25: qty 2

## 2019-07-25 MED ORDER — SODIUM CHLORIDE 0.9 % IV SOLN
0.0125 ug/kg/min | INTRAVENOUS | Status: AC
  Administered 2019-07-25: 09:00:00 .1 ug/kg/min via INTRAVENOUS
  Filled 2019-07-25: qty 2000

## 2019-07-25 MED ORDER — MAGNESIUM SULFATE 2 GM/50ML IV SOLN: 2.0000 g | Freq: Every day | INTRAVENOUS | Status: DC | PRN

## 2019-07-25 MED ORDER — PROPOFOL 10 MG/ML IV BOLUS
INTRAVENOUS | Status: DC | PRN
  Administered 2019-07-25: 100 mg via INTRAVENOUS
  Administered 2019-07-25: 30 mg via INTRAVENOUS

## 2019-07-25 MED ORDER — HEPARIN SODIUM (PORCINE) 1000 UNIT/ML IJ SOLN
INTRAMUSCULAR | Status: AC
  Filled 2019-07-25: qty 1

## 2019-07-25 MED ORDER — SUGAMMADEX SODIUM 200 MG/2ML IV SOLN
INTRAVENOUS | Status: DC | PRN
  Administered 2019-07-25: 150 mg via INTRAVENOUS

## 2019-07-25 MED ORDER — SODIUM CHLORIDE 0.9 % IV SOLN
INTRAVENOUS | Status: DC
  Administered 2019-07-25: 14:00:00 via INTRAVENOUS

## 2019-07-25 MED ORDER — HYDROMORPHONE HCL 1 MG/ML IJ SOLN
INTRAMUSCULAR | Status: AC
  Filled 2019-07-25: qty 1

## 2019-07-25 MED ORDER — ROCURONIUM BROMIDE 10 MG/ML (PF) SYRINGE
PREFILLED_SYRINGE | INTRAVENOUS | Status: AC
  Filled 2019-07-25: qty 10

## 2019-07-25 MED ORDER — POLYETHYLENE GLYCOL 3350 17 G PO PACK: 17.0000 g | PACK | Freq: Every day | ORAL | Status: DC | PRN

## 2019-07-25 MED ORDER — HYDROCERIN EX CREA
1.0000 "application " | TOPICAL_CREAM | Freq: Two times a day (BID) | CUTANEOUS | Status: DC | PRN
  Filled 2019-07-25: qty 113

## 2019-07-25 MED ORDER — ALUM & MAG HYDROXIDE-SIMETH 200-200-20 MG/5ML PO SUSP: 15.0000 mL | ORAL | Status: DC | PRN

## 2019-07-25 MED ORDER — SODIUM CHLORIDE 0.9 % IV SOLN
INTRAVENOUS | Status: DC | PRN
  Administered 2019-07-25: 09:00:00 25 ug/min via INTRAVENOUS

## 2019-07-25 MED ORDER — AMLODIPINE BESYLATE 5 MG PO TABS
5.0000 mg | ORAL_TABLET | Freq: Every day | ORAL | Status: DC
  Administered 2019-07-26: 10:00:00 5 mg via ORAL
  Filled 2019-07-25: qty 1

## 2019-07-25 MED ORDER — PROTAMINE SULFATE 10 MG/ML IV SOLN
INTRAVENOUS | Status: AC
  Filled 2019-07-25: qty 5

## 2019-07-25 MED ORDER — FENTANYL CITRATE (PF) 250 MCG/5ML IJ SOLN
INTRAMUSCULAR | Status: AC
  Filled 2019-07-25: qty 5

## 2019-07-25 MED ORDER — POTASSIUM CHLORIDE CRYS ER 20 MEQ PO TBCR: 20.0000 meq | EXTENDED_RELEASE_TABLET | Freq: Every day | ORAL | Status: DC | PRN

## 2019-07-25 MED ORDER — SODIUM CHLORIDE 0.9 % IV SOLN: INTRAVENOUS | Status: DC

## 2019-07-25 MED ORDER — PHENOL 1.4 % MT LIQD: 1.0000 | OROMUCOSAL | Status: DC | PRN

## 2019-07-25 MED ORDER — SODIUM CHLORIDE 0.9 % IV SOLN
INTRAVENOUS | Status: DC | PRN
  Administered 2019-07-25: 500 mL

## 2019-07-25 MED ORDER — PROPOFOL 10 MG/ML IV BOLUS
INTRAVENOUS | Status: AC
  Filled 2019-07-25: qty 40

## 2019-07-25 MED ORDER — GUAIFENESIN-DM 100-10 MG/5ML PO SYRP
15.0000 mL | ORAL_SOLUTION | ORAL | Status: DC | PRN
  Administered 2019-07-26: 05:00:00 15 mL via ORAL
  Filled 2019-07-25: qty 15

## 2019-07-25 MED ORDER — ASPIRIN EC 81 MG PO TBEC
81.0000 mg | DELAYED_RELEASE_TABLET | Freq: Every day | ORAL | Status: DC
  Administered 2019-07-26: 81 mg via ORAL
  Filled 2019-07-25 (×2): qty 1

## 2019-07-25 MED ORDER — BISACODYL 10 MG RE SUPP: 10.0000 mg | Freq: Every day | RECTAL | Status: DC | PRN

## 2019-07-25 MED ORDER — ONDANSETRON HCL 4 MG/2ML IJ SOLN
INTRAMUSCULAR | Status: DC | PRN
  Administered 2019-07-25: 4 mg via INTRAVENOUS

## 2019-07-25 MED ORDER — DEXAMETHASONE SODIUM PHOSPHATE 10 MG/ML IJ SOLN
INTRAMUSCULAR | Status: AC
  Filled 2019-07-25: qty 1

## 2019-07-25 MED ORDER — LIDOCAINE 2% (20 MG/ML) 5 ML SYRINGE
INTRAMUSCULAR | Status: AC
  Filled 2019-07-25: qty 5

## 2019-07-25 MED ORDER — SODIUM CHLORIDE 0.9 % IV SOLN: 500.0000 mL | Freq: Once | INTRAVENOUS | Status: DC | PRN

## 2019-07-25 MED ORDER — CEFAZOLIN SODIUM-DEXTROSE 2-4 GM/100ML-% IV SOLN
2.0000 g | Freq: Three times a day (TID) | INTRAVENOUS | Status: AC
  Administered 2019-07-25 – 2019-07-26 (×2): 2 g via INTRAVENOUS
  Filled 2019-07-25 (×2): qty 100

## 2019-07-25 SURGICAL SUPPLY — 42 items
ADH SKN CLS APL DERMABOND .7 (GAUZE/BANDAGES/DRESSINGS) ×1
CANISTER SUCT 3000ML PPV (MISCELLANEOUS) ×3 IMPLANT
CANNULA VESSEL 3MM 2 BLNT TIP (CANNULA) ×6 IMPLANT
CATH ROBINSON RED A/P 18FR (CATHETERS) ×3 IMPLANT
CLIP LIGATING EXTRA MED SLVR (CLIP) ×3 IMPLANT
CLIP LIGATING EXTRA SM BLUE (MISCELLANEOUS) ×3 IMPLANT
COVER WAND RF STERILE (DRAPES) ×1 IMPLANT
DECANTER SPIKE VIAL GLASS SM (MISCELLANEOUS) IMPLANT
DERMABOND ADVANCED (GAUZE/BANDAGES/DRESSINGS) ×2
DERMABOND ADVANCED .7 DNX12 (GAUZE/BANDAGES/DRESSINGS) ×1 IMPLANT
DRAIN HEMOVAC 1/8 X 5 (WOUND CARE) IMPLANT
ELECT REM PT RETURN 9FT ADLT (ELECTROSURGICAL) ×3
ELECTRODE REM PT RTRN 9FT ADLT (ELECTROSURGICAL) ×1 IMPLANT
EVACUATOR SILICONE 100CC (DRAIN) IMPLANT
GLOVE BIO SURGEON STRL SZ 6.5 (GLOVE) ×4 IMPLANT
GLOVE BIO SURGEONS STRL SZ 6.5 (GLOVE) ×4
GLOVE SS BIOGEL STRL SZ 7.5 (GLOVE) ×1 IMPLANT
GLOVE SUPERSENSE BIOGEL SZ 7.5 (GLOVE) ×2
GOWN STRL REUS W/ TWL LRG LVL3 (GOWN DISPOSABLE) ×3 IMPLANT
GOWN STRL REUS W/TWL LRG LVL3 (GOWN DISPOSABLE) ×9
KIT BASIN OR (CUSTOM PROCEDURE TRAY) ×3 IMPLANT
KIT SHUNT ARGYLE CAROTID ART 6 (VASCULAR PRODUCTS) IMPLANT
KIT TURNOVER KIT B (KITS) ×3 IMPLANT
NEEDLE 22X1 1/2 (OR ONLY) (NEEDLE) IMPLANT
NS IRRIG 1000ML POUR BTL (IV SOLUTION) ×6 IMPLANT
PACK CAROTID (CUSTOM PROCEDURE TRAY) ×3 IMPLANT
PAD ARMBOARD 7.5X6 YLW CONV (MISCELLANEOUS) ×6 IMPLANT
PATCH HEMASHIELD 8X75 (Vascular Products) ×2 IMPLANT
POSITIONER HEAD DONUT 9IN (MISCELLANEOUS) ×3 IMPLANT
SHUNT CAROTID BYPASS 10 (VASCULAR PRODUCTS) ×2 IMPLANT
SHUNT CAROTID BYPASS 12FRX15.5 (VASCULAR PRODUCTS) IMPLANT
SPONGE LAP 18X18 RF (DISPOSABLE) ×4 IMPLANT
SUT ETHILON 3 0 PS 1 (SUTURE) IMPLANT
SUT PROLENE 6 0 CC (SUTURE) ×3 IMPLANT
SUT SILK 3 0 (SUTURE)
SUT SILK 3-0 18XBRD TIE 12 (SUTURE) IMPLANT
SUT VIC AB 3-0 SH 27 (SUTURE) ×6
SUT VIC AB 3-0 SH 27X BRD (SUTURE) ×2 IMPLANT
SUT VICRYL 4-0 PS2 18IN ABS (SUTURE) ×3 IMPLANT
SYR CONTROL 10ML LL (SYRINGE) IMPLANT
TOWEL GREEN STERILE (TOWEL DISPOSABLE) ×3 IMPLANT
WATER STERILE IRR 1000ML POUR (IV SOLUTION) ×3 IMPLANT

## 2019-07-25 NOTE — Progress Notes (Addendum)
Patient arrived from pacu to 85e12. Patient with incision to rt neck, appears slightly puffy/swollen at upper aspect of incision. Patient placed on monitor and vital signs obtained will monitor patient. Shamika Pedregon, Bettina Gavia rN

## 2019-07-25 NOTE — Anesthesia Procedure Notes (Signed)
Procedure Name: Intubation Date/Time: 07/25/2019 8:47 AM Performed by: Colin Benton, CRNA Pre-anesthesia Checklist: Patient identified, Emergency Drugs available, Suction available and Patient being monitored Patient Re-evaluated:Patient Re-evaluated prior to induction Oxygen Delivery Method: Circle system utilized Preoxygenation: Pre-oxygenation with 100% oxygen Induction Type: IV induction Ventilation: Mask ventilation without difficulty and Oral airway inserted - appropriate to patient size Laryngoscope Size: 3 and Mac Grade View: Grade II Tube type: Oral Tube size: 7.5 mm Number of attempts: 1 Airway Equipment and Method: Stylet Placement Confirmation: ETT inserted through vocal cords under direct vision,  positive ETCO2 and breath sounds checked- equal and bilateral Secured at: 23 cm Tube secured with: Tape Dental Injury: Teeth and Oropharynx as per pre-operative assessment

## 2019-07-25 NOTE — Interval H&P Note (Signed)
History and Physical Interval Note:  07/25/2019 7:48 AM  Christian Bennett  has presented today for surgery, with the diagnosis of RIGHT CAROTID ARTERY STENOSIS.  The various methods of treatment have been discussed with the patient and family. After consideration of risks, benefits and other options for treatment, the patient has consented to  Procedure(s): ENDARTERECTOMY CAROTID RIGHT (Right) as a surgical intervention.  The patient's history has been reviewed, patient examined, no change in status, stable for surgery.  I have reviewed the patient's chart and labs.  Questions were answered to the patient's satisfaction.     Curt Jews

## 2019-07-25 NOTE — Progress Notes (Signed)
  Day of Surgery Note    Subjective:  Says he's sore over his incision   Vitals:   07/25/19 1103 07/25/19 1118  BP: 127/65 128/62  Pulse: 71 65  Resp: 11 14  Temp:    SpO2: 95% 96%    Incisions:   Clean and dry Extremities:  Moving all extremities equally Lungs:  Non labored Neuro:  In tact; tongue midline   Assessment/Plan:  This is a 85 y.o. male who is s/p  Right CEA  -neuro in tact -tongue midline -awaiting bed on [place], [place] 07/25/2019 11:40 AM [phone]

## 2019-07-25 NOTE — Anesthesia Postprocedure Evaluation (Signed)
Anesthesia Post Note  Patient: Christian Bennett  Procedure(s) Performed: RIGHT ENDARTERECTOMY CAROTID with patch angioplasty (Right Neck)     Patient location during evaluation: PACU Anesthesia Type: General Level of consciousness: awake Pain management: pain level controlled Vital Signs Assessment: post-procedure vital signs reviewed and stable Respiratory status: spontaneous breathing Cardiovascular status: stable Postop Assessment: no apparent nausea or vomiting Anesthetic complications: no    Last Vitals:  Vitals:   07/25/19 1049 07/25/19 1103  BP: 128/62 127/65  Pulse: 72 71  Resp: 19 11  Temp: (!) 36.1 C   SpO2: 100% 95%    Last Pain:  Vitals:   07/25/19 1103  TempSrc:   PainSc: 0-No pain    LLE Motor Response: Purposeful movement;Responds to commands (07/25/19 1103) LLE Sensation: Full sensation (07/25/19 1103) RLE Motor Response: Purposeful movement;Responds to commands (07/25/19 1103) RLE Sensation: Full sensation (07/25/19 1103)      Dimonique Bourdeau

## 2019-07-25 NOTE — Transfer of Care (Signed)
Immediate Anesthesia Transfer of Care Note  Patient: Christian Bennett  Procedure(s) Performed: RIGHT ENDARTERECTOMY CAROTID with patch angioplasty (Right Neck)  Patient Location: PACU  Anesthesia Type:General  Level of Consciousness: drowsy and patient cooperative  Airway & Oxygen Therapy: Patient Spontanous Breathing and Patient connected to nasal cannula oxygen  Post-op Assessment: Report given to RN, Post -op Vital signs reviewed and stable, Patient moving all extremities X 4 and Patient able to stick tongue midline  Post vital signs: Reviewed and stable  Last Vitals:  Vitals Value Taken Time  BP    Temp    Pulse 72 07/25/19 1049  Resp 19 07/25/19 1049  SpO2 100 % 07/25/19 1049  Vitals shown include unvalidated device data.  Last Pain:  Vitals:   07/25/19 0715  TempSrc: Oral  PainSc:       Patients Stated Pain Goal: 4 ([id number])  Complications: No apparent anesthesia complications

## 2019-07-25 NOTE — Anesthesia Procedure Notes (Signed)
Arterial Line Insertion Start/End10/21/2020 7:50 AM, 07/25/2019 7:55 AM Performed by: Colin Benton, CRNA, CRNA  Patient location: Pre-op. Preanesthetic checklist: patient identified, IV checked, site marked, risks and benefits discussed, surgical consent, monitors and equipment checked, pre-op evaluation, timeout performed and anesthesia consent Lidocaine 1% used for infiltration Left, radial was placed Catheter size: 20 G Hand hygiene performed , maximum sterile barriers used  and Seldinger technique used Allen's test indicative of satisfactory collateral circulation Attempts: 1 Procedure performed without using ultrasound guided technique. Following insertion, dressing applied and Biopatch. Post procedure assessment: normal and unchanged  Patient tolerated the procedure well with no immediate complications.

## 2019-07-25 NOTE — Op Note (Signed)
° °  OPERATIVE REPORT  DATE OF SURGERY: 07/25/2019  PATIENT: Christian Bennett, 85 y.o. male MRN: [id number]  DOB: 04/09/1928  PRE-OPERATIVE DIAGNOSIS: Right Carotid Stenosis, Symptomatic  POST-OPERATIVE DIAGNOSIS:  Same  PROCEDURE:  Right Carotid Endarterectomy with Dacron Patch Angioplasty  SURGEON:  Curt Jews, M.D.  PHYSICIAN ASSISTANT: Rhyne PA-C  ANESTHESIA:   general  EBL: Less than 200 ml  Total I/O In: -  Out: 100 [Blood:100]  BLOOD ADMINISTERED: none  DRAINS: none   SPECIMEN: none  COUNTS CORRECT:  YES  PLAN OF CARE: Admit to inpatient   PATIENT DISPOSITION:  PACU - hemodynamically stable and neurologically intact.  PROCEDURE DETAILS: The patient was taken to the operating room placed in supine position.  General anesthesia was administered.  The neck was prepped and draped in the usual sterile fashion.  An incision was made anterior to the sternocleidomastoid and carried down through the platysma with electrocautery.  The sternocleidomastoid was reflected posteriorly and the carotid sheath was opened.  The facial vein was ligated with 2-0 silk ties and divided.  The common carotid artery was encircled with an umbilical tape and Rummel tourniquet.  The vagus nerve was identified and preserved.  Dissection was continued onto the carotid bifurcation.  The superior thyroid artery was encircled with a 2-0 silk Potts tie.  The external carotid was encircled with a blue vessel loop and the internal carotid was encircled with an umbilical tape and Rummel tourniquet.  The hypoglossal nerve was identified and preserved.  The patient was given systemic heparin and after adequate circulation time, the internal, external and common carotid arteries were occluded with vascular clamps.  The common carotid artery was opened with an 11 blade and extended  longitudinally with Potts scissors.  A 10 shunt was passed up the internal carotid and allowed to backbleed.  It was then passed down  the common carotid where it was secured with Rummel tourniquet.  The endarterectomy was begun on the common carotid artery and the plaque was divided proximally with Potts scissors.  The endarterectomy was continued onto the bifurcation.  The external carotid was endarterectomized with an eversion technique and the internal carotid was endarterectomized in an open fashion.  Remaining atheromatous debris was removed from the endarterectomy plane.  A Finesse Hemashield Dacron patch was brought onto the field and was sewn as a patch angioplasty with a running 6-0 Prolene suture.  Prior to completion of the closure the shunt was removed and the usual flushing maneuvers were undertaken.  The anastomosis was completed and flow was restored first to the external and then the internal carotid artery.  Excellent flow characteristics were noted with hand-held Doppler in the internal and external carotid arteries.  The patient was given 50 mg of protamine to reverse the heparin.  The wounds were irrigated with saline.  Hemostasis was obtained with electrocautery.  The wounds were closed with 3-0 Vicryl to reapproximate the sternocleidomastoid over the carotid sheath.  The platysma was lysed with a running 3-0 Vicryl suture.  The skin was closed with a 4-0 subcuticular Vicryl stitch.  Dermabond was applied.  The patient was awakened neurologically intact in the operating room and transferred to the recovery room in stable condition   Curt Jews, M.D. 07/25/2019 10:46 AM

## 2019-07-25 NOTE — Discharge Instructions (Signed)
° °  [facility] ° °Discharge Instructions °  °Carotid Endarterectomy (CEA) ° °Please refer to the following instructions for your post-procedure care. Your surgeon or physician assistant will discuss any changes with you. ° °Activity ° °You are encouraged to walk as much as you can. You can slowly return to normal activities but must avoid strenuous activity and heavy lifting until your doctor tell you it's okay. Avoid activities such as vacuuming or swinging a golf club. You can drive after one week if you are comfortable and you are no longer taking prescription pain medications. It is normal to feel tired for serval weeks after your surgery. It is also normal to have difficulty with sleep habits, eating, and bowel movements after surgery. These will go away with time. ° °Bathing/Showering ° °Shower daily after you go home. Do not soak in a bathtub, hot tub, or swim until the incision heals completely. ° °Incision Care ° °Shower every day. Clean your incision with mild soap and water. Pat the area dry with a clean towel. You do not need a bandage unless otherwise instructed. Do not apply any ointments or creams to your incision. You may have skin glue on your incision. Do not peel it off. It will come off on its own in about one week. Your incision may feel thickened and raised for several weeks after your surgery. This is normal and the skin will soften over time.  ° °For Men Only: It's okay to shave around the incision but do not shave the incision itself for 2 weeks. It is common to have numbness under your chin that could last for several months. ° °Diet ° °Resume your normal diet. There are no special food restrictions following this procedure. A low fat/low cholesterol diet is recommended for all patients with vascular disease. In order to heal from your surgery, it is CRITICAL to get adequate nutrition. Your body requires vitamins, minerals, and protein. Vegetables are the  best source of vitamins and minerals. Vegetables also provide the perfect balance of protein. Processed food has little nutritional value, so try to avoid this. ° °Medications ° °Resume taking all of your medications unless your doctor or physician assistant tells you not to. If your incision is causing pain, you may take over-the- counter pain relievers such as acetaminophen (Tylenol). If you were prescribed a stronger pain medication, please be aware these medications can cause nausea and constipation. Prevent nausea by taking the medication with a snack or meal. Avoid constipation by drinking plenty of fluids and eating foods with a high amount of fiber, such as fruits, vegetables, and grains.  °Do not take Tylenol if you are taking prescription pain medications. ° °Follow Up ° °Our office will schedule a follow up appointment 2-3 weeks following discharge. ° °Please call us immediately for any of the following conditions ° °Increased pain, redness, drainage (pus) from your incision site. °Fever of 101 degrees or higher. °If you should develop stroke (slurred speech, difficulty swallowing, weakness on one side of your body, loss of vision) you should call 911 and go to the nearest emergency room. ° °Reduce your risk of vascular disease: ° °Stop smoking. If you would like help call [facility] at 1-800-QUIT-NOW (1-800-784-8669) or [facility] at 336-586-4000. °Manage your cholesterol °Maintain a desired weight °Control your diabetes °Keep your blood pressure down ° °If you have any questions, please call the office at [phone]. ° °

## 2019-07-26 ENCOUNTER — Encounter (HOSPITAL_COMMUNITY): Payer: Self-pay | Admitting: Vascular Surgery

## 2019-07-26 LAB — BASIC METABOLIC PANEL
Anion gap: 7 (ref 5–15)
BUN: 12 mg/dL (ref 8–23)
CO2: 25 mmol/L (ref 22–32)
Calcium: 8.6 mg/dL — ABNORMAL LOW (ref 8.9–10.3)
Chloride: 106 mmol/L (ref 98–111)
Creatinine, Ser: 1.34 mg/dL — ABNORMAL HIGH (ref 0.61–1.24)
GFR calc Af Amer: 54 mL/min — ABNORMAL LOW (ref >60)
GFR calc non Af Amer: 47 mL/min — ABNORMAL LOW (ref >60)
Glucose, Bld: 110 mg/dL — ABNORMAL HIGH (ref 70–99)
Potassium: 3.9 mmol/L (ref 3.5–5.1)
Sodium: 138 mmol/L (ref 135–145)

## 2019-07-26 LAB — CBC
HCT: 27.4 % — ABNORMAL LOW (ref 39.0–52.0)
Hemoglobin: 8.6 g/dL — ABNORMAL LOW (ref 13.0–17.0)
MCH: 29.8 pg (ref 26.0–34.0)
MCHC: 31.4 g/dL (ref 30.0–36.0)
MCV: 94.8 fL (ref 80.0–100.0)
Platelets: 151 10*3/uL (ref 150–400)
RBC: 2.89 MIL/uL — ABNORMAL LOW (ref 4.22–5.81)
RDW: 13.3 % (ref 11.5–15.5)
WBC: 9 10*3/uL (ref 4.0–10.5)
nRBC: 0 % (ref 0.0–0.2)

## 2019-07-26 NOTE — Plan of Care (Signed)
Adequate for discharge.

## 2019-07-26 NOTE — Progress Notes (Addendum)
  Progress Note    07/26/2019 7:32 AM 1 Day Post-Op  Subjective:  Says he had a coughing spell last night.  No trouble swallowing and voiding every hour.  Afebrile HR 50's-60's NSR [id number] systolic [id number] RA  Vitals:   07/26/19 0015 07/26/19 0425  BP: 117/67 136/62  Pulse: 62 (!) 57  Resp: 16 17  Temp: 98 F (36.7 C) (!) 97.5 F (36.4 C)  SpO2: 94% 100%     Physical Exam: Neuro:  In tact Lungs:  Non labored Incision:  Clean and dry with extensive ecchymosis over neck and down onto the chest.  No hematoma present.  CBC    Component Value Date/Time   WBC 9.0 07/26/2019 0430   RBC 2.89 (L) 07/26/2019 0430   HGB 8.6 (L) 07/26/2019 0430   HGB 12.1 (L) 12/12/2018 1036   HCT 27.4 (L) 07/26/2019 0430   HCT 35.6 (L) 12/12/2018 1036   PLT 151 07/26/2019 0430   PLT 185 12/12/2018 1036   MCV 94.8 07/26/2019 0430   MCV 90 12/12/2018 1036   MCH 29.8 07/26/2019 0430   MCHC 31.4 07/26/2019 0430   RDW 13.3 07/26/2019 0430   RDW 12.8 12/12/2018 1036   LYMPHSABS 1.5 07/10/2019 1528   LYMPHSABS 0.8 12/12/2018 1036   MONOABS 0.7 07/10/2019 1528   EOSABS 0.4 07/10/2019 1528   EOSABS 0.9 (H) 12/12/2018 1036   BASOSABS 0.1 07/10/2019 1528   BASOSABS 0.1 12/12/2018 1036    BMET    Component Value Date/Time   NA 138 07/26/2019 0430   NA 134 12/12/2018 1036   K 3.9 07/26/2019 0430   CL 106 07/26/2019 0430   CO2 25 07/26/2019 0430   GLUCOSE 110 (H) 07/26/2019 0430   BUN 12 07/26/2019 0430   BUN 23 12/12/2018 1036   CREATININE 1.34 (H) 07/26/2019 0430   CALCIUM 8.6 (L) 07/26/2019 0430   GFRNONAA 47 (L) 07/26/2019 0430   GFRAA 54 (L) 07/26/2019 0430     Intake/Output Summary (Last 24 hours) at 07/26/2019 0732 Last data filed at 07/26/2019 0600 Gross per 24 hour  Intake 1827.35 ml  Output 550 ml  Net 1277.35 ml     Assessment/Plan:  This is a 85 y.o. male who is s/p right CEA 1 Day Post-Op  -pt is doing well this am. -pt neuro exam is in tact -pt has  ambulated -pt has voided -f/u with Dr. Donnetta Hutching in 2 weeks.   Leontine Locket, PA-C [facility] [phone]  I have examined the patient, reviewed and agree with above.  Curt Jews, MD 07/26/2019 9:38 AM

## 2019-07-26 NOTE — Discharge Summary (Signed)
Discharge Summary     Christian Bennett Mar 28, 1928 85 y.o. male  [id number]  Admission Date: 07/25/2019  Discharge Date: 07/26/2019  Physician: Rosetta Posner, MD  Admission Diagnosis: RIGHT CAROTID ARTERY STENOSIS   HPI:   This is a 85 y.o. male with past medical history significant for hypertension was brought to the emergency department for stroke work-up.  He had sudden onset of slurred speech left facial droop and left arm weakness.  He was not given TPA as symptoms quickly improved.  Work-up has included an MRI of the brain positive for right hemispheric CVA.  MRA of neck demonstrated high-grade stenosis versus short segment occlusion of proximal right ICA.  Carotid duplex is pending.  His daughter is present during exam this morning.  Patient states and she agrees that his symptoms have completely resolved.  At the time of CVA he was not taking an aspirin, statin, or other antiplatelet.  He is a former smoker.  Patient's daughter states he is very functional for his age and lives alone and is able to take care of himself.  Hospital Course:  The patient was admitted to the hospital and taken to the operating room on 07/25/2019 and underwent right carotid endarterectomy.    The pt tolerated the procedure well and was transported to the PACU in good condition.   By POD 1, the pt neuro status was in tact.  He did have some extensive ecchymosis over his neck and down onto his chest but no hematoma.  He is neuro intact.  No difficulty swallowing and voiding well.    The remainder of the hospital course consisted of increasing mobilization and increasing intake of solids without difficulty.   Recent Labs    07/23/19 1026 07/26/19 0430  NA 137 138  K 3.7 3.9  CL 104 106  CO2 26 25  GLUCOSE 97 110*  BUN 10 12  CALCIUM 9.0 8.6*   Recent Labs    07/23/19 1026 07/26/19 0430  WBC 6.8 9.0  HGB 11.3* 8.6*  HCT 36.5* 27.4*  PLT 205 151   Recent Labs    07/23/19 1026   INR 1.1     Discharge Instructions    Discharge patient   Complete by: As directed    Discharge disposition: 01-Home or Self Care   Discharge patient date: 07/26/2019      Discharge Diagnosis:  RIGHT CAROTID ARTERY STENOSIS  Secondary Diagnosis: Patient Active Problem List   Diagnosis Date Noted  . Carotid artery stenosis, symptomatic, right 07/25/2019  . CVA (cerebral vascular accident) ([place]) 07/11/2019  . TIA (transient ischemic attack) 07/10/2019  . Adjustment disorder with mixed anxiety and depressed mood 06/20/2018  . Vitamin D insufficiency 06/20/2018  . History of anemia 06/20/2018  . Medically noncompliant-   does not want to go for hearing eval 06/20/2018  . Bilateral impacted cerumen 02/07/2018  . Hearing difficulty of both ears 02/03/2018  . Environmental and seasonal allergies 02/03/2018  . Eustachian tube dysfunction, bilateral 02/03/2018  . History of vitamin D deficiency 01/30/2018  . Contact dermatitis and eczema 10/19/2017  . HTN (hypertension) 06/22/2017  . GERD (gastroesophageal reflux disease) 06/22/2017  . Hx of colonoscopy 06/22/2017  . Mood disorder ([place]) 06/22/2017  . Hypothyroidism 06/22/2017  . Chronic pancreatitis ([place]) 06/22/2017   Past Medical History:  Diagnosis Date  . Acid reflux   . Hypertension   . Hypothyroidism   . Thyroid disease     Allergies as of 07/26/2019  Reactions   Doxycycline Hives   Eggs Or Egg-derived Products    UNSPECIFIED REACTION  >> "Sick"      Medication List    TAKE these medications   amLODipine 5 MG tablet Commonly known as: NORVASC Take 1 tablet by mouth once daily   aspirin 81 MG EC tablet Take 1 tablet (81 mg total) by mouth daily.   atorvastatin 40 MG tablet Commonly known as: LIPITOR Take 1 tablet (40 mg total) by mouth daily at 6 PM.   clopidogrel 75 MG tablet Commonly known as: PLAVIX Take 1 tablet (75 mg total) by mouth daily.   Creon 36000 UNITS Cpep capsule Generic drug:  lipase/protease/amylase Take 36,000 Units by mouth 3 (three) times daily with meals.   eucerin cream APPLY TWICE DAILY AS NEEDED FOR RASH OR ITCHING What changed: See the new instructions.   fluticasone 50 MCG/ACT nasal spray Commonly known as: FLONASE 2 sprays each nostril after sinus rinse   magnesium oxide 400 (241.3 Mg) MG tablet Commonly known as: MAG-OX Take 1 tablet (400 mg total) by mouth daily.   NP Thyroid 60 MG tablet Generic drug: thyroid TAKE 1 TABLET BY MOUTH ONCE DAILY BEFORE BREAKFAST What changed: See the new instructions.   sertraline 25 MG tablet Commonly known as: ZOLOFT Take 1 tablet by mouth once daily   triamcinolone 0.1 % cream : eucerin Crea Apply 1 application topically 2 (two) times daily as needed. What changed: reasons to take this   Vitamin D (Ergocalciferol) 1.25 MG (50000 UT) Caps capsule Commonly known as: DRISDOL Take 1 capsule (50,000 Units total) by mouth every 7 (seven) days.   Zegerid 20-1100 MG Caps capsule Generic drug: Omeprazole-Sodium Bicarbonate Take 1 capsule by mouth daily before breakfast.        [facility] Discharge Instructions Carotid Endarterectomy (CEA)  Please refer to the following instructions for your post-procedure care. Your surgeon or physician assistant will discuss any changes with you.  Activity  You are encouraged to walk as much as you can. You can slowly return to normal activities but must avoid strenuous activity and heavy lifting until your doctor tell you it's OK. Avoid activities such as vacuuming or swinging a golf club. You can drive after one week if you are comfortable and you are no longer taking prescription pain medications. It is normal to feel tired for serval weeks after your surgery. It is also normal to have difficulty with sleep habits, eating, and bowel movements after surgery. These will go away with time.  Bathing/Showering  You may shower after you  come home. Do not soak in a bathtub, hot tub, or swim until the incision heals completely.  Incision Care  Shower every day. Clean your incision with mild soap and water. Pat the area dry with a clean towel. You do not need a bandage unless otherwise instructed. Do not apply any ointments or creams to your incision. You may have skin glue on your incision. Do not peel it off. It will come off on its own in about one week. Your incision may feel thickened and raised for several weeks after your surgery. This is normal and the skin will soften over time. For Men Only: It's OK to shave around the incision but do not shave the incision itself for 2 weeks. It is common to have numbness under your chin that could last for several months.  Diet  Resume your normal diet. There are no special food  restrictions following this procedure. A low fat/low cholesterol diet is recommended for all patients with vascular disease. In order to heal from your surgery, it is CRITICAL to get adequate nutrition. Your body requires vitamins, minerals, and protein. Vegetables are the best source of vitamins and minerals. Vegetables also provide the perfect balance of protein. Processed food has little nutritional value, so try to avoid this.  Medications  Resume taking all of your medications unless your doctor or physician assistant tells you not to.  If your incision is causing pain, you may take over-the- counter pain relievers such as acetaminophen (Tylenol). If you were prescribed a stronger pain medication, please be aware these medications can cause nausea and constipation.  Prevent nausea by taking the medication with a snack or meal. Avoid constipation by drinking plenty of fluids and eating foods with a high amount of fiber, such as fruits, vegetables, and grains.  Do not take Tylenol if you are taking prescription pain medications.  Follow Up  Our office will schedule a follow up appointment 2-3 weeks following  discharge.  Please call us immediately for any of the following conditions  . Increased pain, redness, drainage (pus) from your incision site. . Fever of 101 degrees or higher. . If you should develop stroke (slurred speech, difficulty swallowing, weakness on one side of your body, loss of vision) you should call 911 and go to the nearest emergency room. .  Reduce your risk of vascular disease:  . Stop smoking. If you would like help call [facility] at 1-800-QUIT-NOW 978-115-3491) or [place] at (956)736-7187. . Manage your cholesterol . Maintain a desired weight . Control your diabetes . Keep your blood pressure down .  If you have any questions, please call the office at [phone].  Prescriptions given:  None given  Disposition: home  Patient's condition: is Good  Follow up: 1. Dr. Donnetta Hutching in 2 weeks.   Leontine Locket, PA-C [facility] [phone]   --- For [facility] use ---   Modified Rankin score at D/C (0-6): 0  IV medication needed for:  1. Hypertension: No 2. Hypotension: No  Post-op Complications: No  1. Post-op CVA or TIA: No  If yes: Event classification (right eye, left eye, right cortical, left cortical, verterobasilar, other): n/a  If yes: Timing of event (intra-op, <6 hrs post-op, >=6 hrs post-op, unknown): n/a  2. CN injury: No  If yes: CN n/a injuried   3. Myocardial infarction: No  If yes: Dx by (EKG or clinical, Troponin): n/a  4.  CHF: No  5.  Dysrhythmia (new): No  6. Wound infection: No  7. Reperfusion symptoms: No  8. Return to OR: No  If yes: return to OR for (bleeding, neurologic, other CEA incision, other): n/a  Discharge medications: Statin use:  Yes ASA use:  Yes   Beta blocker use:  No ACE-Inhibitor use:  No  ARB use:  No CCB use: Yes P2Y12 Antagonist use: Yes, [ x] Plavix, [ ]  Plasugrel, [ ]  Ticlopinine, [ ]  Ticagrelor, [ ]  Other, [ ]  No for medical reason, [ ]  Non-compliant, [ ]   Not-indicated Anti-coagulant use:  No, [ ]  Warfarin, [ ]  Rivaroxaban, [ ]  Dabigatran,

## 2019-07-27 ENCOUNTER — Encounter (HOSPITAL_COMMUNITY): Payer: Self-pay | Admitting: Vascular Surgery

## 2019-07-27 DIAGNOSIS — I10 Essential (primary) hypertension: Secondary | ICD-10-CM | POA: Diagnosis not present

## 2019-07-27 DIAGNOSIS — Z48812 Encounter for surgical aftercare following surgery on the circulatory system: Secondary | ICD-10-CM | POA: Diagnosis not present

## 2019-07-27 DIAGNOSIS — I69354 Hemiplegia and hemiparesis following cerebral infarction affecting left non-dominant side: Secondary | ICD-10-CM | POA: Diagnosis not present

## 2019-07-27 DIAGNOSIS — I6521 Occlusion and stenosis of right carotid artery: Secondary | ICD-10-CM | POA: Diagnosis not present

## 2019-07-27 DIAGNOSIS — F4323 Adjustment disorder with mixed anxiety and depressed mood: Secondary | ICD-10-CM | POA: Diagnosis not present

## 2019-07-27 DIAGNOSIS — Z87891 Personal history of nicotine dependence: Secondary | ICD-10-CM | POA: Diagnosis not present

## 2019-07-27 DIAGNOSIS — D649 Anemia, unspecified: Secondary | ICD-10-CM | POA: Diagnosis not present

## 2019-07-27 DIAGNOSIS — E559 Vitamin D deficiency, unspecified: Secondary | ICD-10-CM | POA: Diagnosis not present

## 2019-07-27 LAB — TYPE AND SCREEN
ABO/RH(D): O POS
Antibody Screen: POSITIVE
DAT, IgG: POSITIVE
Unit division: 0
Unit division: 0

## 2019-07-27 LAB — BPAM RBC
Blood Product Expiration Date: 202011202359
Blood Product Expiration Date: 202011222359
ISSUE DATE / TIME: 202010191925
Unit Type and Rh: 5100
Unit Type and Rh: 5100

## 2019-07-30 ENCOUNTER — Ambulatory Visit: Payer: Medicare Other | Admitting: Family Medicine

## 2019-07-30 DIAGNOSIS — I6521 Occlusion and stenosis of right carotid artery: Secondary | ICD-10-CM | POA: Diagnosis not present

## 2019-07-30 DIAGNOSIS — Z48812 Encounter for surgical aftercare following surgery on the circulatory system: Secondary | ICD-10-CM | POA: Diagnosis not present

## 2019-07-30 DIAGNOSIS — F4323 Adjustment disorder with mixed anxiety and depressed mood: Secondary | ICD-10-CM | POA: Diagnosis not present

## 2019-07-30 DIAGNOSIS — D649 Anemia, unspecified: Secondary | ICD-10-CM | POA: Diagnosis not present

## 2019-07-30 DIAGNOSIS — I10 Essential (primary) hypertension: Secondary | ICD-10-CM | POA: Diagnosis not present

## 2019-07-30 DIAGNOSIS — I69354 Hemiplegia and hemiparesis following cerebral infarction affecting left non-dominant side: Secondary | ICD-10-CM | POA: Diagnosis not present

## 2019-08-01 DIAGNOSIS — I69354 Hemiplegia and hemiparesis following cerebral infarction affecting left non-dominant side: Secondary | ICD-10-CM | POA: Diagnosis not present

## 2019-08-01 DIAGNOSIS — D649 Anemia, unspecified: Secondary | ICD-10-CM | POA: Diagnosis not present

## 2019-08-01 DIAGNOSIS — I6521 Occlusion and stenosis of right carotid artery: Secondary | ICD-10-CM | POA: Diagnosis not present

## 2019-08-01 DIAGNOSIS — F4323 Adjustment disorder with mixed anxiety and depressed mood: Secondary | ICD-10-CM | POA: Diagnosis not present

## 2019-08-01 DIAGNOSIS — Z48812 Encounter for surgical aftercare following surgery on the circulatory system: Secondary | ICD-10-CM | POA: Diagnosis not present

## 2019-08-01 DIAGNOSIS — I10 Essential (primary) hypertension: Secondary | ICD-10-CM | POA: Diagnosis not present

## 2019-08-03 DIAGNOSIS — F4323 Adjustment disorder with mixed anxiety and depressed mood: Secondary | ICD-10-CM | POA: Diagnosis not present

## 2019-08-03 DIAGNOSIS — I6521 Occlusion and stenosis of right carotid artery: Secondary | ICD-10-CM | POA: Diagnosis not present

## 2019-08-03 DIAGNOSIS — I69354 Hemiplegia and hemiparesis following cerebral infarction affecting left non-dominant side: Secondary | ICD-10-CM | POA: Diagnosis not present

## 2019-08-03 DIAGNOSIS — Z48812 Encounter for surgical aftercare following surgery on the circulatory system: Secondary | ICD-10-CM | POA: Diagnosis not present

## 2019-08-03 DIAGNOSIS — D649 Anemia, unspecified: Secondary | ICD-10-CM | POA: Diagnosis not present

## 2019-08-03 DIAGNOSIS — I10 Essential (primary) hypertension: Secondary | ICD-10-CM | POA: Diagnosis not present

## 2019-08-06 ENCOUNTER — Telehealth: Payer: Self-pay

## 2019-08-06 ENCOUNTER — Other Ambulatory Visit: Payer: Self-pay | Admitting: Family Medicine

## 2019-08-06 DIAGNOSIS — E039 Hypothyroidism, unspecified: Secondary | ICD-10-CM

## 2019-08-06 NOTE — Telephone Encounter (Signed)
Please call pt to schedule f/u.  No further refills until pt is seen.  T. Gaje Tennyson, CMA 

## 2019-08-07 DIAGNOSIS — I10 Essential (primary) hypertension: Secondary | ICD-10-CM | POA: Diagnosis not present

## 2019-08-07 DIAGNOSIS — F4323 Adjustment disorder with mixed anxiety and depressed mood: Secondary | ICD-10-CM | POA: Diagnosis not present

## 2019-08-07 DIAGNOSIS — I6521 Occlusion and stenosis of right carotid artery: Secondary | ICD-10-CM | POA: Diagnosis not present

## 2019-08-07 DIAGNOSIS — Z48812 Encounter for surgical aftercare following surgery on the circulatory system: Secondary | ICD-10-CM | POA: Diagnosis not present

## 2019-08-07 DIAGNOSIS — D649 Anemia, unspecified: Secondary | ICD-10-CM | POA: Diagnosis not present

## 2019-08-07 DIAGNOSIS — I69354 Hemiplegia and hemiparesis following cerebral infarction affecting left non-dominant side: Secondary | ICD-10-CM | POA: Diagnosis not present

## 2019-08-08 ENCOUNTER — Telehealth: Payer: Self-pay | Admitting: *Deleted

## 2019-08-08 ENCOUNTER — Other Ambulatory Visit: Payer: Self-pay | Admitting: *Deleted

## 2019-08-08 DIAGNOSIS — I6521 Occlusion and stenosis of right carotid artery: Secondary | ICD-10-CM

## 2019-08-08 MED ORDER — ATORVASTATIN CALCIUM 40 MG PO TABS: 40.0000 mg | ORAL_TABLET | Freq: Every day | ORAL | 0 refills | Status: DC

## 2019-08-08 MED ORDER — CLOPIDOGREL BISULFATE 75 MG PO TABS: 75.0000 mg | ORAL_TABLET | Freq: Every day | ORAL | 11 refills | Status: DC

## 2019-08-08 NOTE — Telephone Encounter (Signed)
Call from daughter. Patient ran out of Plavix and Lipitor 2 days ago. Patient has appt with Dr. Donnetta Hutching on 08/14/2019. Instructed her I would re-order for 1 month but to clarify with Dr. Donnetta Hutching at appt which doctor would be managing these medications long term.

## 2019-08-10 DIAGNOSIS — I69354 Hemiplegia and hemiparesis following cerebral infarction affecting left non-dominant side: Secondary | ICD-10-CM | POA: Diagnosis not present

## 2019-08-10 DIAGNOSIS — F4323 Adjustment disorder with mixed anxiety and depressed mood: Secondary | ICD-10-CM | POA: Diagnosis not present

## 2019-08-10 DIAGNOSIS — D649 Anemia, unspecified: Secondary | ICD-10-CM | POA: Diagnosis not present

## 2019-08-10 DIAGNOSIS — I6521 Occlusion and stenosis of right carotid artery: Secondary | ICD-10-CM | POA: Diagnosis not present

## 2019-08-10 DIAGNOSIS — I10 Essential (primary) hypertension: Secondary | ICD-10-CM | POA: Diagnosis not present

## 2019-08-10 DIAGNOSIS — Z48812 Encounter for surgical aftercare following surgery on the circulatory system: Secondary | ICD-10-CM | POA: Diagnosis not present

## 2019-08-14 ENCOUNTER — Other Ambulatory Visit: Payer: Self-pay

## 2019-08-14 ENCOUNTER — Encounter: Payer: Self-pay | Admitting: Vascular Surgery

## 2019-08-14 ENCOUNTER — Ambulatory Visit (INDEPENDENT_AMBULATORY_CARE_PROVIDER_SITE_OTHER): Payer: Self-pay | Admitting: Vascular Surgery

## 2019-08-14 VITALS — BP 124/69 | HR 72 | Temp 97.6°F | Resp 20 | Ht 70.0 in | Wt 157.0 lb

## 2019-08-14 DIAGNOSIS — I6521 Occlusion and stenosis of right carotid artery: Secondary | ICD-10-CM

## 2019-08-14 NOTE — Progress Notes (Signed)
   Patient name: Christian Bennett MRN: [id number] DOB: 1928/02/04 Sex: male  REASON FOR VISIT: Follow-up right carotid endarterectomy  HPI: Christian Bennett is a 85 y.o. male here today for follow-up.  He had presented with right brain stroke with MRI confirming this.  He had excellent recovery from his deficit.  He was found to have a high-grade right carotid stenosis and underwent uneventful endarterectomy on 07/25/2019.  He was discharged home on postoperative day 1.  He has returned to his usual activities.  He is here today with his daughter  Current Outpatient Medications  Medication Sig Dispense Refill  . amLODipine (NORVASC) 5 MG tablet Take 1 tablet by mouth once daily 90 tablet 0  . aspirin EC 81 MG EC tablet Take 1 tablet (81 mg total) by mouth daily.    Marland Kitchen atorvastatin (LIPITOR) 40 MG tablet Take 1 tablet (40 mg total) by mouth daily at 6 PM. 30 tablet 0  . clopidogrel (PLAVIX) 75 MG tablet Take 1 tablet (75 mg total) by mouth daily. 30 tablet 11  . fluticasone (FLONASE) 50 MCG/ACT nasal spray 2 sprays each nostril after sinus rinse 16 g 2  . lipase/protease/amylase (CREON) 36000 UNITS CPEP capsule Take 36,000 Units by mouth 3 (three) times daily with meals.    . magnesium oxide (MAG-OX) 400 (241.3 Mg) MG tablet Take 1 tablet (400 mg total) by mouth daily.    Earney Navy Bicarbonate (ZEGERID) 20-1100 MG CAPS capsule Take 1 capsule by mouth daily before breakfast.    . sertraline (ZOLOFT) 25 MG tablet Take 1 tablet by mouth once daily 90 tablet 0  . Skin Protectants, Misc. (EUCERIN) cream APPLY TWICE DAILY AS NEEDED FOR RASH OR ITCHING (Patient taking differently: Apply 1 application topically 2 (two) times daily as needed for dry skin. ) 454 g 1  . thyroid (NP THYROID) 60 MG tablet Take 1 tablet (60 mg total) by mouth daily before breakfast. OFFICE VISIT REQUIRED PRIOR TO ANY FURTHER REFILLS 30 tablet 0  . Triamcinolone Acetonide (TRIAMCINOLONE 0.1  % CREAM : EUCERIN) CREA Apply 1 application topically 2 (two) times daily as needed. (Patient taking differently: Apply 1 application topically 2 (two) times daily as needed for rash. ) 1 each 1  . Vitamin D, Ergocalciferol, (DRISDOL) 50000 units CAPS capsule Take 1 capsule (50,000 Units total) by mouth every 7 (seven) days. 12 capsule 10   No current facility-administered medications for this visit.      PHYSICAL EXAM: Vitals:   08/14/19 1541 08/14/19 1543  BP: 118/72 124/69  Pulse: 72   Resp: 20   Temp: 97.6 F (36.4 C)   SpO2: 98%   Weight: 157 lb (71.2 kg)   Height: 5\' 10"  (1.778 m)     GENERAL: The patient is a well-nourished male, in no acute distress. The vital signs are documented above. His neck incision is well-healed.  He is neurologically intact.  MEDICAL ISSUES: Stable status post right carotid endarterectomy for symptomatic disease.  He will resume full activities without limitation.  We will see him in 9 months with repeat carotid duplex   Rosetta Posner, MD [facility] Tel [phone] Pager [phone]

## 2019-08-15 ENCOUNTER — Telehealth: Payer: Self-pay | Admitting: Family Medicine

## 2019-08-15 NOTE — Telephone Encounter (Signed)
Patient does not need to follow up with [place] but with Cardiologist per discharge summary from patient surgery. Please advise patient. AS, CMA

## 2019-08-15 NOTE — Telephone Encounter (Signed)
Called pt to set up Telehealth to refill Rx (per daughter pt Rx were picked up 11/4 not needed again for 39month )   --- She also mentioned that pt had a TIA in Oct  & just had surgery on carotid artery 11/6 both were over nite stays in [facility] ( advised a Hosp F/u appt maybe needed w/ PCP --she states he had F/U w Surgeon & was cleared and released---  --Forwarding message to medical asst for review w/ provider for direction (told her someone from our office will be calling them back@ [phone]  --glh

## 2019-08-19 ENCOUNTER — Other Ambulatory Visit: Payer: Self-pay | Admitting: Family Medicine

## 2019-08-19 DIAGNOSIS — I10 Essential (primary) hypertension: Secondary | ICD-10-CM

## 2019-08-21 ENCOUNTER — Other Ambulatory Visit: Payer: Self-pay

## 2019-08-21 DIAGNOSIS — I6521 Occlusion and stenosis of right carotid artery: Secondary | ICD-10-CM

## 2019-08-23 ENCOUNTER — Telehealth: Payer: Self-pay | Admitting: Family Medicine

## 2019-08-23 ENCOUNTER — Other Ambulatory Visit: Payer: Self-pay | Admitting: Family Medicine

## 2019-08-23 NOTE — Telephone Encounter (Signed)
Christian Bennett, patient's caretaker states that he needs a refill of his chlorthalidone, but it seems this med was D/C'd by Dr. Jenetta Downer from what I can see. Can we contact them if he should not be taking this med or if he should and the refill is approved please send to [facility]

## 2019-08-23 NOTE — Telephone Encounter (Signed)
The Chlorthalidone was actually d/c'ed back on 07/12/2019 per AVS from ED.  [name] is not on patient DPR so I am unable to speak with her but I did reach out to Judson Roch who is patients other daughter and advised her of this. She stated she thought patient was supposed to start back on med after his surgery with cardiologist but per that AVS he was not on the medication.  Per Dr. Jenetta Downer last office note from September patient was to follow up in 4-6 weeks. I advised Judson Roch that we needed to schedule a [facility] apt to discuss chronic care and meds. Judson Roch was in agreement. The call was transferred to the front desk for scheduling. AS, CMA

## 2019-08-23 NOTE — Telephone Encounter (Signed)
Please see other telephone message from today. AS, CMA

## 2019-08-23 NOTE — Telephone Encounter (Signed)
Per daughter pt was advised to stop taking this Rx prior to Surgery/ 07/25/19 but has restarted medication & is down to 4 pills of :  chlorthalidone (HYGROTON) 25 MG tablet [id number] DISCONTINUED  Order Details Dose, Route, Frequency: As Directed  Dispense Quantity: 90 tablet Refills: 0 Fills remaining: --        Sig: Take 1 tablet by mouth once daily       Discontinue Date:  07/12/2019 13:55 Discontinue User:  Geradine Girt, DO Discontinue Reason:  Stop Taking at Discharge  Written Date: 04/23/19 Expiration Date: 04/22/20    Start Date: 04/23/19 End Date: 07/12/19         Ordering Provider: Mellody Dance, DO DEA #:  [id number] NPI:  [id number]      ---Forwarding 2nd request to med asst for review w/ provider & to contact pt's daughter / Tim Lair @ [phone] if any questions.  --glh

## 2019-09-03 ENCOUNTER — Telehealth: Payer: Self-pay | Admitting: Family Medicine

## 2019-09-03 NOTE — Telephone Encounter (Signed)
Patient was d/ced from this medication on 07/12/19 by ED doc and we have no documentation that the med was to be restarted. I spoke with Christian Bennett about this earlier this month and she verbalized understanding at that time.   I called Christian Bennett back to discuss this again and she said she was not the one that called-that her sister Christian Bennett called about the med. She said she would discuss this with her sister and they would talk to Dr. Jenetta Downer about it at his upcoming apt to decide if patient still needs med. AS, CMA

## 2019-09-03 NOTE — Telephone Encounter (Signed)
Patient's daughter requesting call update about chlorthalidone refill. Please contact when available. She states her father has an upcoming appt but only 4 days left of this med.

## 2019-09-05 ENCOUNTER — Encounter: Payer: Self-pay | Admitting: Adult Health

## 2019-09-05 ENCOUNTER — Other Ambulatory Visit: Payer: Self-pay | Admitting: Vascular Surgery

## 2019-09-05 ENCOUNTER — Ambulatory Visit (INDEPENDENT_AMBULATORY_CARE_PROVIDER_SITE_OTHER): Payer: Medicare Other | Admitting: Adult Health

## 2019-09-05 ENCOUNTER — Other Ambulatory Visit: Payer: Self-pay

## 2019-09-05 VITALS — BP 112/62 | HR 68 | Temp 96.9°F | Ht 70.0 in | Wt 158.6 lb

## 2019-09-05 DIAGNOSIS — I63511 Cerebral infarction due to unspecified occlusion or stenosis of right middle cerebral artery: Secondary | ICD-10-CM

## 2019-09-05 DIAGNOSIS — I6521 Occlusion and stenosis of right carotid artery: Secondary | ICD-10-CM | POA: Diagnosis not present

## 2019-09-05 DIAGNOSIS — E785 Hyperlipidemia, unspecified: Secondary | ICD-10-CM

## 2019-09-05 DIAGNOSIS — I10 Essential (primary) hypertension: Secondary | ICD-10-CM

## 2019-09-05 NOTE — Progress Notes (Signed)
[facility] [address] [place]. [place] [address] [phone]       HOSPITAL FOLLOW UP NOTE  [name] Date of Birth:  1928-06-25 Medical Record Number:  [id number]   Reason for Referral:  hospital stroke follow up    CHIEF COMPLAINT:  Chief Complaint  Patient presents with   Follow-up    Stroke follow-up    HPI: Christian Bennett being seen today for in office hospital follow-up regarding multiple right brain infarcts in setting of severe right ICA stenosis on 07/10/2019.  History obtained from patient, daughter and chart review. Reviewed all radiology images and labs personally.  [name] is a 85 y.o. male with history of HTN and alcohol use presented on 07/10/2019 with slurry speech, L facial droop (resolved en route) and L sided weakness.  Stroke work-up showed multiple right brain infarcts as evidenced on MRI in setting of severe right ICA stenosis thromboembolic secondary to large vessel disease source.  In addition to acute stroke, MRI also showed old right frontal occipital lobe infarcts along with severe small vessel disease.  MRA head showed severe right P3 stenosis.  MRA neck showed severe stenosis proximal right ICA.  Carotid Doppler showed right ICA 80 to 99% stenosis.  Evaluation by Dr. Donnetta Hutching with plan on undergoing right CEA in 1 to 2 weeks.  2D echo showed an EF of 60 to 65%.  Recommended DAPT for 3 months then aspirin alone.  HTN stable and recommended SBP goal 130-150 prior to CVA due to right ICA stenosis.  LDL 120 and initiate atorvastatin 40 mg daily.  No history or evidence of DM with A1c 5.5.  Other stroke risk factors include advanced age, former tobacco use, EtOH use, family history of stroke and prior stroke on imaging.  Other active problems include possible dementia, GERD, thyroid disease, hypokalemia and CKD.  He was discharged home in stable condition without therapy needs.  [name] is a 85 year old male who is being  seen today for hospital follow-up accompanied by his daughter.  He has been doing well from a stroke standpoint without reported residual deficits per daughter does report worsening cognition from baseline.  He does continue to live alone but is checked on frequently by family who provides assistance for majority of IADLs but independent with ADLs.  He did undergo right CEA on 07/25/2019 by Dr. Donnetta Hutching without complication.  Recommended follow-up in 9 months with repeat carotid duplex.  He continues on aspirin and Plavix without bleeding or bruising.  Daughter questioning ongoing use of Plavix as daughter reports being advised by Dr. Donnetta Hutching that ongoing Plavix is not needed from a vascular standpoint and to discuss further with our office.  Continues on atorvastatin without myalgias.  Blood pressure today 112/62.  No concerns at this time.    ROS:   14 system review of systems performed and negative with exception of memory loss  PMH:  Past Medical History:  Diagnosis Date   Acid reflux    Carotid artery occlusion    Hypertension    Hypothyroidism    Thyroid disease     PSH:  Past Surgical History:  Procedure Laterality Date   CAROTID ENDARTERECTOMY Right 07/25/2019   COLONOSCOPY     ENDARTERECTOMY Right 07/25/2019   Procedure: RIGHT ENDARTERECTOMY CAROTID with patch angioplasty;  Surgeon: Rosetta Posner, MD;  Location: [facility] OR;  Service: Vascular;  Laterality: Right;   ESOPHAGOGASTRODUODENOSCOPY     EYE SURGERY Bilateral    cataracts  INGUINAL HERNIA REPAIR     LIPOMA EXCISION     MELANOMA EXCISION     REPLACEMENT TOTAL KNEE BILATERAL      Social History:  Social History   Socioeconomic History   Marital status: Widowed    Spouse name: Not on file   Number of children: Not on file   Years of education: Not on file   Highest education level: Not on file  Occupational History   Occupation: retired  Scientist, product/process development strain: Not hard at all     Food insecurity    Worry: Never true    Inability: Never true   Transportation needs    Medical: No    Non-medical: Not on file  Tobacco Use   Smoking status: Former Smoker    Quit date: 1953    Years since quitting: 67.9   Smokeless tobacco: Former Systems developer    Types: Chew  Substance and Sexual Activity   Alcohol use: Yes    Comment: 2oz Vodka a day - family regulates daily   Drug use: No   Sexual activity: Not Currently    Birth control/protection: None  Lifestyle   Physical activity    Days per week: 0 days    Minutes per session: 0 min   Stress: Only a little  Relationships   Social connections    Talks on phone: Twice a week    Gets together: Once a week    Attends religious service: More than 4 times per year    Active member of club or organization: Yes    Attends meetings of clubs or organizations: 1 to 4 times per year    Relationship status: Widowed   Intimate partner violence    Fear of current or ex partner: No    Emotionally abused: No    Physically abused: No    Forced sexual activity: No  Other Topics Concern   Not on file  Social History Narrative   Not on file    Family History:  Family History  Problem Relation Age of Onset   Stroke Mother    Stroke Father     Medications:   Current Outpatient Medications on File Prior to Visit  Medication Sig Dispense Refill   amLODipine (NORVASC) 5 MG tablet Take 1 tablet (5 mg total) by mouth daily. **Patient needs follow up appointment for further refills** 30 tablet 0   aspirin EC 81 MG EC tablet Take 1 tablet (81 mg total) by mouth daily.     atorvastatin (LIPITOR) 40 MG tablet Take 1 tablet (40 mg total) by mouth daily at 6 PM. 30 tablet 0   fluticasone (FLONASE) 50 MCG/ACT nasal spray 2 sprays each nostril after sinus rinse 16 g 2   lipase/protease/amylase (CREON) 36000 UNITS CPEP capsule Take 36,000 Units by mouth 3 (three) times daily with meals.     magnesium oxide (MAG-OX) 400  (241.3 Mg) MG tablet Take 1 tablet (400 mg total) by mouth daily.     Omeprazole-Sodium Bicarbonate (ZEGERID) 20-1100 MG CAPS capsule Take 1 capsule by mouth daily before breakfast.     sertraline (ZOLOFT) 25 MG tablet Take 1 tablet by mouth once daily 90 tablet 0   Skin Protectants, Misc. (EUCERIN) cream APPLY TWICE DAILY AS NEEDED FOR RASH OR ITCHING (Patient taking differently: Apply 1 application topically 2 (two) times daily as needed for dry skin. ) 454 g 1   thyroid (NP THYROID) 60 MG tablet Take 1 tablet (  60 mg total) by mouth daily before breakfast. OFFICE VISIT REQUIRED PRIOR TO ANY FURTHER REFILLS 30 tablet 0   Triamcinolone Acetonide (TRIAMCINOLONE 0.1 % CREAM : EUCERIN) CREA Apply 1 application topically 2 (two) times daily as needed. (Patient taking differently: Apply 1 application topically 2 (two) times daily as needed for rash. ) 1 each 1   Vitamin D, Ergocalciferol, (DRISDOL) 50000 units CAPS capsule Take 1 capsule (50,000 Units total) by mouth every 7 (seven) days. 12 capsule 10   No current facility-administered medications on file prior to visit.     Allergies:   Allergies  Allergen Reactions   Doxycycline Hives   Eggs Or Egg-Derived Products     UNSPECIFIED REACTION  >> "Sick"     Physical Exam  Vitals:   09/05/19 1117  BP: 112/62  Pulse: 68  Temp: (!) 96.9 F (36.1 C)  Weight: 158 lb 9.6 oz (71.9 kg)  Height: 5\' 10"  (1.778 m)   Body mass index is 22.76 kg/m. No exam data present  General: well developed, well nourished, pleasant elderly Caucasian male, seated, in no evident distress Head: head normocephalic and atraumatic.   Neck: supple with no carotid or supraclavicular bruits Cardiovascular: regular rate and rhythm, no murmurs Musculoskeletal: no deformity Skin:  no rash/petichiae; post R CEA surgical incision healing well Vascular:  Normal pulses all extremities   Neurologic Exam Mental Status: Awake and fully alert. Oriented to place and  time. Recent and remote memory impaired.  Attention span, concentration and fund of knowledge appropriate appropriate during visit. Mood and affect appropriate.  Cranial Nerves: Fundoscopic exam reveals sharp disc margins. Pupils equal, briskly reactive to light. Extraocular movements full without nystagmus. Visual fields full to confrontation. Hearing intact. Facial sensation intact. Face, tongue, palate moves normally and symmetrically.  Motor: Normal bulk and tone. Normal strength in all tested extremity muscles except mildly decreased left hip flexor. Sensory.: intact to touch , pinprick , position and vibratory sensation.  Coordination: Rapid alternating movements normal in all extremities. Finger-to-nose and heel-to-shin performed accurately bilaterally. Gait and Station: Arises from chair without difficulty. Stance is normal. Gait demonstrates normal stride length and balance Reflexes: 1+ and symmetric. Toes downgoing.     NIHSS  0 Modified Rankin  2    Diagnostic Data (Labs, Imaging, Testing)  CT HEAD WO CONTRAST 07/10/2019 IMPRESSION: 1. Atrophy and extensive chronic ischemic changes. No acute intracranial abnormality 2. ASPECTS is 10  MR BRAIN WO CONTRAST 07/10/2019 IMPRESSION: 1. Multiple small foci of acute ischemia within the right hemisphere, concentrated along the precentral and postcentral gyri. This is in keeping with left-sided symptoms. 2. No hemorrhage or mass effect. 3. Old right frontal and right occipital lobe infarcts. 4. Severe chronic small vessel ischemic microangiopathy.  MR MRA HEAD  MR MRA NECK 07/11/2019 IMPRESSION: 1. No proximal large vessel occlusion or high-grade stenosis. 2. Severe stenosis of the right PCA P3 segment.  ECHOCARDIOGRAM 07/11/2019 IMPRESSIONS  1. Left ventricular ejection fraction, by visual estimation, is 60 to 65%. The left ventricle has normal function. Normal left ventricular size. Left ventricular septal wall thickness  was mildly increased. There is mildly increased left ventricular  hypertrophy.  2. Global right ventricle has normal systolic function.The right ventricular size is normal. No increase in right ventricular wall thickness.  3. Left atrial size was normal.  4. Right atrial size was normal.  5. Mild mitral annular calcification.  6. The mitral valve is normal in structure. Trace mitral valve regurgitation. No evidence of  mitral stenosis.  7. The tricuspid valve is normal in structure. Tricuspid valve regurgitation was not visualized by color flow Doppler.  8. The aortic valve is normal in structure. Aortic valve regurgitation is trivial by color flow Doppler. Mild to moderate aortic valve sclerosis/calcification without any evidence of aortic stenosis.  9. Some calcification extending into the LVOT but no LVOT gradient. 10. The pulmonic valve was normal in structure. Pulmonic valve regurgitation is not visualized by color flow Doppler. 11. Normal pulmonary artery systolic pressure. 12. The inferior vena cava is normal in size with greater than 50% respiratory variability, suggesting right atrial pressure of 3 mmHg.    ASSESSMENT: Christian Bennett is a 84 y.o. year old male presented with slurred speech, left facial droop and left-sided weakness on 07/10/2019 with stroke work-up revealing multiple right brain infarcts in setting of right ICA stenosis secondary to large vessel disease source.  He did undergo right CEA with Dr. Donnetta Hutching without complication.  Vascular risk factors include HTN, HLD, prior stroke on imaging, carotid stenosis, advanced age, former tobacco use, and EtOH use.  Recovered well from a stroke standpoint with residual mild left hip flexor weakness and daughter reporting slightly worsening memory/cognition from baseline    PLAN:  1. Right brain stroke: Continue aspirin 81 mg daily  and atorvastatin for secondary stroke prevention.  Advised to discontinue Plavix at this time and  continue on aspirin as no indication for ongoing DAPT indicated with recent right CEA.  Maintain strict control of hypertension with blood pressure goal below 130/90, diabetes with hemoglobin A1c goal below 6.5% and cholesterol with LDL cholesterol (bad cholesterol) goal below 70 mg/dL.  I also advised the patient to eat a healthy diet with plenty of whole grains, cereals, fruits and vegetables, exercise regularly with at least 30 minutes of continuous activity daily and maintain ideal body weight. 2. HTN: Advised to continue current treatment regimen.  Today's BP stable.  Advised to continue to monitor at home along with continued follow-up with PCP for management 3. HLD: Advised to continue current treatment regimen along with continued follow-up with PCP for future prescribing and monitoring of lipid panel 4. Carotid stenosis s/p R CEA: Continue to follow with Dr. Donnetta Hutching as recommended 5. Cognitive impairment: Discussion regarding use of crossword puzzles, word search, card games and reading along with compensation strategies with potential ongoing improvement    Follow up in 3 months or call earlier if needed   Greater than 50% of time during this 45 minute visit was spent on counseling, explanation of diagnosis of right brain stroke, reviewing risk factor management of HTN, HLD, R ICA stenosis s/p CEA, planning of further management along with potential future management, and discussion with patient and family answering all questions.    Frann Rider, AGNP-BC  [facility] [address] [place], [place] [address]  Phone [phone] Fax [phone] Note: This document was prepared with digital dictation and possible smart phrase technology. Any transcriptional errors that result from this process are unintentional.

## 2019-09-05 NOTE — Patient Instructions (Signed)
Continue aspirin 81 mg daily  and Lipitor for secondary stroke prevention  You can discontinue Plavix and continue on aspirin alone  Follow-up with Dr. Donnetta Hutching with repeat imaging as scheduled  Continue to follow up with PCP regarding cholesterol and blood pressure management   Recommend memory exercises such as crossword puzzles, word search, card games and reading  Continue to monitor blood pressure at home  Maintain strict control of hypertension with blood pressure goal below 130/90, diabetes with hemoglobin A1c goal below 6.5% and cholesterol with LDL cholesterol (bad cholesterol) goal below 70 mg/dL. I also advised the patient to eat a healthy diet with plenty of whole grains, cereals, fruits and vegetables, exercise regularly and maintain ideal body weight.  Followup in the future with me in 3 months or call earlier if needed       Thank you for coming to see [place] at [facility]. I hope we have been able to provide you high quality care today.  You may receive a patient satisfaction survey over the next few weeks. We would appreciate your feedback and comments so that we may continue to improve ourselves and the health of our patients.

## 2019-09-06 NOTE — Progress Notes (Signed)
I agree with the above plan 

## 2019-09-08 ENCOUNTER — Other Ambulatory Visit: Payer: Self-pay | Admitting: Family Medicine

## 2019-09-08 DIAGNOSIS — E559 Vitamin D deficiency, unspecified: Secondary | ICD-10-CM

## 2019-09-08 DIAGNOSIS — Z8639 Personal history of other endocrine, nutritional and metabolic disease: Secondary | ICD-10-CM

## 2019-09-08 DIAGNOSIS — E039 Hypothyroidism, unspecified: Secondary | ICD-10-CM

## 2019-09-11 ENCOUNTER — Ambulatory Visit: Payer: Medicare Other | Admitting: Family Medicine

## 2019-09-11 ENCOUNTER — Telehealth: Payer: Self-pay | Admitting: Family Medicine

## 2019-09-11 DIAGNOSIS — I6521 Occlusion and stenosis of right carotid artery: Secondary | ICD-10-CM

## 2019-09-11 DIAGNOSIS — E039 Hypothyroidism, unspecified: Secondary | ICD-10-CM

## 2019-09-11 DIAGNOSIS — Z8639 Personal history of other endocrine, nutritional and metabolic disease: Secondary | ICD-10-CM

## 2019-09-11 DIAGNOSIS — E559 Vitamin D deficiency, unspecified: Secondary | ICD-10-CM

## 2019-09-11 MED ORDER — ATORVASTATIN CALCIUM 40 MG PO TABS: 40.0000 mg | ORAL_TABLET | Freq: Every day | ORAL | 0 refills | Status: DC

## 2019-09-11 MED ORDER — THYROID 60 MG PO TABS: ORAL_TABLET | ORAL | 0 refills | Status: DC

## 2019-09-11 MED ORDER — VITAMIN D (ERGOCALCIFEROL) 1.25 MG (50000 UNIT) PO CAPS: 50000.0000 [IU] | ORAL_CAPSULE | ORAL | 0 refills | Status: DC

## 2019-09-11 MED ORDER — AMLODIPINE BESYLATE 5 MG PO TABS: 5.0000 mg | ORAL_TABLET | Freq: Every day | ORAL | 0 refills | Status: DC

## 2019-09-11 NOTE — Telephone Encounter (Signed)
Patient's daughter Judson Roch os requesting a call back to discuss some med refill orders to be placed. She said she contacted the [facility] for the refills but states that they have not heard anything from our office in regards to these orders. Please advise

## 2019-09-11 NOTE — Addendum Note (Signed)
Addended by: Mickel Crow on: 09/11/2019 10:49 AM   Modules accepted: Orders

## 2019-09-11 NOTE — Telephone Encounter (Signed)
Patient given 30 day supply. Judson Roch is aware patient needs apt for future refills. Pt had apt today but we had to cancel and move to later in the month. Sarah verbalized understanding. AS< CMA

## 2019-09-12 ENCOUNTER — Telehealth: Payer: Self-pay | Admitting: Family Medicine

## 2019-09-12 NOTE — Telephone Encounter (Signed)
Patient's daughter called states she wants him to be seen IN OFFICE by provider for appt date 12/23----  Forwarding message to med asst to see if that is advisable .  --glh

## 2019-09-26 ENCOUNTER — Ambulatory Visit (INDEPENDENT_AMBULATORY_CARE_PROVIDER_SITE_OTHER): Payer: Medicare Other | Admitting: Family Medicine

## 2019-09-26 ENCOUNTER — Telehealth: Payer: Self-pay | Admitting: Adult Health

## 2019-09-26 ENCOUNTER — Encounter: Payer: Self-pay | Admitting: Family Medicine

## 2019-09-26 ENCOUNTER — Other Ambulatory Visit: Payer: Self-pay

## 2019-09-26 VITALS — BP 130/62 | HR 64 | Ht 70.0 in | Wt 162.0 lb

## 2019-09-26 DIAGNOSIS — N1831 Chronic kidney disease, stage 3a: Secondary | ICD-10-CM

## 2019-09-26 DIAGNOSIS — R413 Other amnesia: Secondary | ICD-10-CM

## 2019-09-26 DIAGNOSIS — Z9119 Patient's noncompliance with other medical treatment and regimen: Secondary | ICD-10-CM

## 2019-09-26 DIAGNOSIS — R4189 Other symptoms and signs involving cognitive functions and awareness: Secondary | ICD-10-CM

## 2019-09-26 DIAGNOSIS — Z9889 Other specified postprocedural states: Secondary | ICD-10-CM

## 2019-09-26 DIAGNOSIS — G459 Transient cerebral ischemic attack, unspecified: Secondary | ICD-10-CM | POA: Insufficient documentation

## 2019-09-26 DIAGNOSIS — E785 Hyperlipidemia, unspecified: Secondary | ICD-10-CM | POA: Diagnosis not present

## 2019-09-26 DIAGNOSIS — I10 Essential (primary) hypertension: Secondary | ICD-10-CM | POA: Diagnosis not present

## 2019-09-26 DIAGNOSIS — E039 Hypothyroidism, unspecified: Secondary | ICD-10-CM

## 2019-09-26 DIAGNOSIS — E559 Vitamin D deficiency, unspecified: Secondary | ICD-10-CM

## 2019-09-26 DIAGNOSIS — Z8673 Personal history of transient ischemic attack (TIA), and cerebral infarction without residual deficits: Secondary | ICD-10-CM

## 2019-09-26 DIAGNOSIS — Z91199 Patient's noncompliance with other medical treatment and regimen due to unspecified reason: Secondary | ICD-10-CM

## 2019-09-26 NOTE — Telephone Encounter (Signed)
I called pts daughter.  Since 09-09-19 he has worsened cognitively/memory. He becomes fixated on things and will not let go then will get agitated.   Was seen on 09-05-19 by JM/NP and was relayed that did have some memory decline and would reassess in 3 months.  Since acute decline per daughter, and due to agitation at times she is asking if any medication could be given to help.  Had been to pcp recently, but would not address. JM/NP out , please advise.  She is aware sending to [facility].  I did make an appt for Monday 10-01-19  to reasses VV.

## 2019-09-26 NOTE — Telephone Encounter (Signed)
Spoke to Judson Roch, daughter and will come in the office at 1045  On Monday for assessment.

## 2019-09-26 NOTE — Telephone Encounter (Signed)
Needs in office eval to figure out what is going on and best treatment. -VRP

## 2019-09-26 NOTE — Progress Notes (Signed)
Telehealth office visit note for Christian Bennett, Christian Bennett- at Primary Care at [facility]   I connected with current patient today and verified that I am speaking with the correct person using two identifiers.   . Location of the patient: Home . Location of the provider: Office Only the patient (+/- their family members at pt's discretion) and myself were participating in the encounter - This visit type was conducted due to national recommendations for restrictions regarding the COVID-19 Pandemic (e.g. social distancing) in an effort to limit this patient's exposure and mitigate transmission in our community.  This format is felt to be most appropriate for this patient at this time.   - The patient did not have access to video technology or had technical difficulties with video requiring transitioning to audio format only. - No physical exam could be performed with this format, beyond that communicated to [place] by the patient/ family members as noted.   - Additionally my office staff/ schedulers discussed with the patient that there may be a monetary charge related to this service, depending on their medical insurance.   The patient expressed understanding, and agreed to proceed.       History of Present Illness: Office visit today is scheduled for a chronic follow-up visit with me to go over his multiple medical problems that we manage alongside his specialists    I, Toni Amend, am serving as scribe for Dr. Mellody Bennett.   Patient says "I'm doing alright right now."  His last OV was in early September of 2020.  Says he's been "just doing nothing" since.  Per patient's daughter, Christian Bennett, patient is confused as a result of recent suspected TIA * 2 and currently unable to understand conversation well/remember things.   Daughter states, physically, overall, the patient is doing well.    Daughter serves as source of patient history in lieu of patient during appointment.   -  History of TIA and recent hospitalization- they never had a hosp f/up OV with [place] Per daughter, the patient had a mini stroke / TIA in October 2020.  At this time, he went to the hospital for assessment.   Additionally, two to three weeks after discharge, he underwent a right carotid endarterectomy by Dr. Sherren Mocha early.   Daughter notes the patient was doing really well, with his normal memory problems, and no issues other than his norm until December 6th.  Daughter states that on December 6th, he became acutely confused, had a blank look in his eyes, would answer questions, wouldn't carry on a conversation.   Notes he became aphasic and unable to communicate well, and at that point the family knew he had another TIA. -Family did not do anything for the patient at this time.  They did not call neurology or seek immediate attention in the ED or urgent care  Patient follows up with neurology regularly-he was initially sent to them to address the family's concerns about patient's memory and now in addition to dementia concerns, they address his acute neuro event/TIA as well.    Besides controlling blood pressure, continuing cholesterol meds, and continuing 81 mg aspirin, daughter says their last visit to neurology took place prior to his most recent suspected TIA.  Neurology wanted to see the patient back in 3 months since last OV "to see how his memory was at that point, to see if they needed to do further memory testing or anything."  Daughter states "they  took him off of Plavix that day."  When asked, daughter states the patient was hospitalized for a bleeding stroke, not an embolic stroke.  Thinks they return to vascular surgeon, Dr. Donnetta Hutching in 3 months.  Daughter says that while this doesn't happen often, the patient occasionally experiences acute spells of confusion, where he "gets something in his mind and can't get it out of his mind, and is determined to get something done."  She wonders if there is a  way to calm him down or sedate him during moments like this.    - Hypothyroidism Patient's daughter says he continues taking his synthroid regularly and remains asymptomatic.    HPI:  Hypertension: - Patient's daughter notes his home BP today of 130/62 is around normal; "it's been lower than that (sometimes down to 100/60), but not any higher than that."   - Patient's daughter reports good compliance with medication and/or lifestyle modification  - Denies acute concerns or problems related to treatment plan,  Patient's daughter denies concerns with the patient experiencing BP-related dizziness or lightheadedness.  Overall denies symptomatic low blood pressure.   Last 3 blood pressure readings in our office are as follows: BP Readings from Last 3 Encounters:  09/26/19 130/62  09/05/19 112/62  08/14/19 124/69   Filed Weights   09/26/19 1013  Weight: 162 lb (73.5 kg)     HPI:  Hyperlipidemia:  85 y.o. male here for cholesterol follow-up.   - Patient's daughter reports good compliance with treatment plan of:  medication and/ or lifestyle management.    Denies any acute concerns or problems with management plan   Denies new onset of: myalgias, arthralgias, increased fatigue more than normal, chest pains, exercise intolerance, shortness of breath, dizziness, visual changes, headache, lower extremity swelling or claudication.   Most recent cholesterol panel was:  Lab Results  Component Value Date   CHOL 185 07/11/2019   HDL 52 07/11/2019   LDLCALC 120 (H) 07/11/2019   TRIG 65 07/11/2019   CHOLHDL 3.6 07/11/2019   Hepatic Function Latest Ref Rng & Units 07/23/2019 07/10/2019 12/12/2018  Total Protein 6.5 - 8.1 g/dL 6.9 6.8 7.0  Albumin 3.5 - 5.0 g/dL 3.8 3.7 4.4  AST 15 - 41 U/L '23 23 16  ' ALT 0 - 44 U/L '16 14 14  ' Alk Phosphatase 38 - 126 U/L 74 71 80  Total Bilirubin 0.3 - 1.2 mg/dL 0.6 0.5 0.3      No flowsheet data found.  Depression screen [facility] 2/9 06/19/2019  12/12/2018 06/20/2018 02/16/2018 02/03/2018  Decreased Interest 1 3 0 0 0  Down, Depressed, Hopeless 0 0 0 0 0  PHQ - 2 Score 1 3 0 0 0  Altered sleeping 1 0 0 0 -  Tired, decreased energy 3 0 1 0 -  Change in appetite 0 0 0 0 -  Feeling bad or failure about yourself  0 0 0 0 -  Trouble concentrating 0 0 0 0 -  Moving slowly or fidgety/restless 0 0 0 0 -  Suicidal thoughts 0 0 1 0 -  PHQ-9 Score '5 3 2 ' 0 -  Difficult doing work/chores Not difficult at all - Not difficult at all - -      Impression and Recommendations:    1. Stage 3a chronic kidney disease   2. History of TIA (transient ischemic attack)   3. Hyperlipidemia, unspecified hyperlipidemia type   4. Hypertension, unspecified type   5. Medically noncompliant-   does not want to  go for hearing eval   6. Vitamin D insufficiency   7. Hypothyroidism, unspecified type   8. H/O carotid endarterectomy   9. Memory changes   10. Cognitive changes      Recent hospitalization for a hemorrhagic TIA -Also recent history of hospitalization and h/o Carotid Endarterectomy - Patient seen in hospital for TIA in October of 2020.  - Reminded daughter Christian Bennett today that patient was intiially sent to neurology to address memory and cognitive concerns; and that, they are the ones to address this issue as specialists  - Told patient's daughter to call neurology and inform them regarding patient's most recent acute neurological episode as neurological specialist is treating the patient's history of neurovascular event/TIA  And  memory / cognitive changes.     -Per family, worsening cognitive Changes, Memory Changes since recent TIA,  - Patient's daughter knows to continue to seek follow-up and assistance through neurology, especially if she and the patient have concerns regarding his newest neurological episode, subsequent cognitive decline, and concerns about his cognitive function in general.  - Discussed that stroke can occur after  carotid endarterectomy, and importance of following up with Dr. Donnetta Hutching of vascular surgery as recommended.  - Will continue to monitor alongside specialist. - Daughter given reassurance and guidance   Hypertension, unspecified type - Given history of TIA, reviewed critical importance of keeping patient's BP maintained in the low normal range.  - Blood pressure currently is at goal. - Patient will continue current treatment regimen.  See med list.  - Ambulatory blood pressure monitoring encouraged at least 3 times weekly.  Keep log and bring in every office visit.  Reminded patient's daughter that if pt ever feels poorly in any way, to check blood pressure and pulse.  - We will continue to monitor.   Hyperlipidemia - Hospitalized for TIA in October 2020  Last FLP in hospital July 11, 2019: - Triglycerides = 65, stable from 65 two years prior. - HDL = 52, stable from 51 two years prior. - LDL = 120, elevated, up from 82 two years prior.  - Per patient's daughter, patient was placed on cholesterol meds while recently in the hospital.  -Reminded patient's daughter of importance of follow-up with PCP as well as specialist after any hospitalization or major surgery etc. explained that it is important we go over any new meds and new conditions that need to be monitored and followed up on  - Pt will continue current treatment regimen.  - We will continue to monitor.  Discussed need for re-check near future.   Vitamin D insufficiency - Last checked March of 2020, 61.1 - Continue supplementation as prescribed.  See med list. - Discussed 40-60 range as ideal.  - Due for re-check in 3 months as discussed.   Hypothyroidism, unspecified type - Patient stable on current treatment. - Asymptomatic at this time.   - Due for re-check near future.   Recommendations - Return near future (1 week or less) for lab only FLP and liver enzymes after the inpatient team started patient on  cholesterol medicine in October.   - Re-check full set of blood work March of 2021-last set was done March 2020.  - Return chronic care OV in 3-4 months.  - As part of my medical decision making, I reviewed the following data within the [place] History obtained from pt /family, CMA notes reviewed and incorporated if applicable, Labs reviewed, Radiograph/ tests reviewed if applicable and OV notes  from prior OV's with me, as well as other specialists she/he has seen since seeing me last, were all reviewed and used in my medical decision making process today.    - Additionally, discussion had with patient regarding our treatment plan, and their biases/concerns about that plan were used in my medical decision making today.    - The patient agreed with the plan and demonstrated an understanding of the instructions.   No barriers to understanding were identified.    - Red flag symptoms and signs discussed in detail.  Patient expressed understanding regarding what to do in case of emergency\ urgent symptoms.   - The patient was advised to call back or seek an in-person evaluation if the symptoms worsen or if the condition fails to improve as anticipated.  Return for FLP and liver enzymes near future after starting statin in October; return OV in 3-4 months.    Orders Placed This Encounter  Procedures  . Lipid panel  . CMP w GFR    I provided 25+ minutes of non face-to-face time during this encounter.  Additional time was spent with charting and coordination of care before and after the actual visit commenced.   Note:  This note was prepared with assistance of Dragon voice recognition software. Occasional wrong-word or sound-a-like substitutions may have occurred due to the inherent limitations of voice recognition software.  This document serves as a record of services personally performed by Christian Dance, DO. It was created on her behalf by Toni Amend, a trained  medical scribe. The creation of this record is based on the scribe's personal observations and the provider's statements to them.   This case required medical decision making of at least moderate complexity. The above documentation has been reviewed to be accurate and was completed by Marjory Sneddon, Christian Bennett.       Patient Care Team    Relationship Specialty Notifications Start End  Christian Dance, DO PCP - General Family Medicine  06/22/17   Gaynelle Arabian, MD Consulting Physician Orthopedic Surgery  06/22/17   Jovita Gamma, MD Consulting Physician Neurosurgery  06/22/17   Carol Ada, MD Consulting Physician Gastroenterology  06/22/17   Eustace Moore, MD Consulting Physician Neurosurgery  06/22/17   Early, Arvilla Meres, MD Consulting Physician Vascular Surgery  09/26/19      -Vitals obtained; medications/ allergies reconciled;  personal medical, social, Sx etc.histories were updated by CMA, reviewed by me and are reflected in chart   Patient Active Problem List   Diagnosis Date Noted  . HTN (hypertension) 06/22/2017  . Chronic pancreatitis ([place]) 06/22/2017  . Mood disorder ([place]) 06/22/2017  . Hypothyroidism 06/22/2017  . GERD (gastroesophageal reflux disease) 06/22/2017  . Hx of colonoscopy 06/22/2017  . Hyperlipidemia 09/26/2019  . Hypertension 09/26/2019  . TIA due to embolism ([place]) 09/26/2019  . H/O carotid endarterectomy 09/26/2019  . Carotid artery stenosis, symptomatic, right 07/25/2019  . CVA (cerebral vascular accident) ([place]) 07/11/2019  . TIA (transient ischemic attack) 07/10/2019  . Adjustment disorder with mixed anxiety and depressed mood 06/20/2018  . Vitamin D insufficiency 06/20/2018  . History of anemia 06/20/2018  . Medically noncompliant-   does not want to go for hearing eval 06/20/2018  . Bilateral impacted cerumen 02/07/2018  . Hearing difficulty of both ears 02/03/2018  . Environmental and seasonal allergies 02/03/2018  . Eustachian tube dysfunction,  bilateral 02/03/2018  . History of vitamin D deficiency 01/30/2018  . Contact dermatitis and eczema 10/19/2017     Current  Meds  Medication Sig  . amLODipine (NORVASC) 5 MG tablet Take 1 tablet (5 mg total) by mouth daily. **Patient needs follow up appointment for further refills**  . aspirin EC 81 MG EC tablet Take 1 tablet (81 mg total) by mouth daily.  Marland Kitchen atorvastatin (LIPITOR) 40 MG tablet Take 1 tablet (40 mg total) by mouth daily at 6 PM.  . fluticasone (FLONASE) 50 MCG/ACT nasal spray 2 sprays each nostril after sinus rinse  . lipase/protease/amylase (CREON) 36000 UNITS CPEP capsule Take 36,000 Units by mouth 3 (three) times daily with meals.  . magnesium oxide (MAG-OX) 400 (241.3 Mg) MG tablet Take 1 tablet (400 mg total) by mouth daily.  Earney Navy Bicarbonate (ZEGERID) 20-1100 MG CAPS capsule Take 1 capsule by mouth daily before breakfast.  . sertraline (ZOLOFT) 25 MG tablet Take 1 tablet by mouth once daily  . Skin Protectants, Misc. (EUCERIN) cream APPLY TWICE DAILY AS NEEDED FOR RASH OR ITCHING (Patient taking differently: Apply 1 application topically 2 (two) times daily as needed for dry skin. )  . thyroid (NP THYROID) 60 MG tablet TAKE 1 TABLET BY MOUTH ONCE DAILY BEFORE BREAKFAST . APPOINTMENT REQUIRED FOR FUTURE REFILLS  . Triamcinolone Acetonide (TRIAMCINOLONE 0.1 % CREAM : EUCERIN) CREA Apply 1 application topically 2 (two) times daily as needed. (Patient taking differently: Apply 1 application topically 2 (two) times daily as needed for rash. )  . Vitamin D, Ergocalciferol, (DRISDOL) 1.25 MG (50000 UT) CAPS capsule Take 1 capsule (50,000 Units total) by mouth every 7 (seven) days. **PATIENT NEEDS APT FOR FURTHER REFILLS**     Allergies:  Allergies  Allergen Reactions  . Doxycycline Hives  . Eggs Or Egg-Derived Products     UNSPECIFIED REACTION  >> "Sick"     ROS:  See above HPI for pertinent positives and negatives   Objective:   Blood pressure  130/62, pulse 64, height '5\' 10"'  (1.778 m), weight 162 lb (73.5 kg).  (if some vitals are omitted, this means that patient was UNABLE to obtain them even though they were asked to get them prior to OV today.  They were asked to call us at their earliest convenience with these once obtained. )  General: A & O * 3; sounds in no acute distress; in usual state of health.  Skin: Pt confirms warm and dry extremities and pink fingertips HEENT: Pt confirms lips non-cyanotic Chest: Patient confirms normal chest excursion and movement Respiratory: speaking in full sentences, no conversational dyspnea; patient confirms no use of accessory muscles Psych: insight appears good, mood- appears full

## 2019-09-26 NOTE — Telephone Encounter (Signed)
Patient daughter Christian Bennett called to know if the patient could be prescribed something for frustration. States that when the patient is fixed on doing something he will not let it go.  Please follow up.

## 2019-10-01 ENCOUNTER — Telehealth: Payer: Self-pay

## 2019-10-01 ENCOUNTER — Encounter: Payer: Self-pay | Admitting: Adult Health

## 2019-10-01 ENCOUNTER — Other Ambulatory Visit: Payer: Self-pay

## 2019-10-01 ENCOUNTER — Ambulatory Visit (INDEPENDENT_AMBULATORY_CARE_PROVIDER_SITE_OTHER): Payer: Medicare Other | Admitting: Adult Health

## 2019-10-01 VITALS — BP 124/88 | HR 69 | Temp 97.6°F | Ht 70.0 in | Wt 164.0 lb

## 2019-10-01 DIAGNOSIS — F0151 Vascular dementia with behavioral disturbance: Secondary | ICD-10-CM

## 2019-10-01 DIAGNOSIS — I10 Essential (primary) hypertension: Secondary | ICD-10-CM

## 2019-10-01 DIAGNOSIS — E538 Deficiency of other specified B group vitamins: Secondary | ICD-10-CM | POA: Diagnosis not present

## 2019-10-01 DIAGNOSIS — I63511 Cerebral infarction due to unspecified occlusion or stenosis of right middle cerebral artery: Secondary | ICD-10-CM

## 2019-10-01 DIAGNOSIS — R29818 Other symptoms and signs involving the nervous system: Secondary | ICD-10-CM

## 2019-10-01 DIAGNOSIS — E785 Hyperlipidemia, unspecified: Secondary | ICD-10-CM | POA: Diagnosis not present

## 2019-10-01 DIAGNOSIS — F01518 Vascular dementia, unspecified severity, with other behavioral disturbance: Secondary | ICD-10-CM

## 2019-10-01 DIAGNOSIS — I6521 Occlusion and stenosis of right carotid artery: Secondary | ICD-10-CM

## 2019-10-01 MED ORDER — DIVALPROEX SODIUM 125 MG PO DR TAB: 125.0000 mg | DELAYED_RELEASE_TABLET | Freq: Every day | ORAL | 3 refills | Status: DC
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
## 2019-10-01 NOTE — Telephone Encounter (Signed)
Attempted to reach out to contact [facility] on site Education officer, museum. No answer left a VM.   "daughter needs to be contacted by [facility] Education officer, museum - can someone reach out to social worker to give her patients daughters name and number" -NP Frann Rider  The daughter: Christian Bennett- [phone]  Please relay.

## 2019-10-01 NOTE — Patient Instructions (Signed)
Recommend initiating Depakote 125 mg once daily with possible need of increasing after 5 to 7 days if behaviors continue.  Also recommend obtaining EEG and CT head to look for underlying factors of worsening cognition, aphasia and left-sided weakness.  We will also obtain vitamin B12 level today  You will also be contacted by [facility] social worker to assist with community resources  You can also look up additional Information at [email or web address]    Recommend follow-up in 1 month or call earlier if needed      Thank you for coming to see [place] at [facility]. I hope we have been able to provide you high quality care today.  You may receive a patient satisfaction survey over the next few weeks. We would appreciate your feedback and comments so that we may continue to improve ourselves and the health of our patients.

## 2019-10-01 NOTE — Telephone Encounter (Signed)
I called and LMVM for [facility] SW (spoke to [place]) [phone] she will send message out about pt, took daughters name and #.  (this may be regarding placement).

## 2019-10-02 LAB — CMP12+TSH
AST: 25 IU/L (ref 0–40)
Albumin/Globulin Ratio: 1.7 (ref 1.2–2.2)
Albumin: 4.2 g/dL (ref 3.6–4.6)
Alkaline Phosphatase: 88 IU/L (ref 39–117)
BUN/Creatinine Ratio: 15 (ref 10–24)
BUN: 20 mg/dL (ref 8–27)
Bilirubin Total: 0.3 mg/dL (ref 0.0–1.2)
Calcium: 9.3 mg/dL (ref 8.6–10.2)
Chloride: 105 mmol/L (ref 96–106)
Creatinine, Ser: 1.36 mg/dL — ABNORMAL HIGH (ref 0.76–1.27)
GFR calc Af Amer: 53 mL/min/{1.73_m2} — ABNORMAL LOW (ref >59)
GFR calc non Af Amer: 46 mL/min/{1.73_m2} — ABNORMAL LOW (ref >59)
Globulin, Total: 2.5 g/dL (ref 1.5–4.5)
Glucose: 77 mg/dL (ref 65–99)
Potassium: 4.5 mmol/L (ref 3.5–5.2)
Sodium: 143 mmol/L (ref 134–144)
TSH: 2.04 u[IU]/mL (ref 0.450–4.500)
Total Protein: 6.7 g/dL (ref 6.0–8.5)

## 2019-10-02 LAB — LIPID PANEL
Chol/HDL Ratio: 3.4 ratio (ref 0.0–5.0)
Cholesterol, Total: 169 mg/dL (ref 100–199)
HDL: 49 mg/dL (ref >39)
LDL Chol Calc (NIH): 98 mg/dL (ref 0–99)
Triglycerides: 126 mg/dL (ref 0–149)
VLDL Cholesterol Cal: 22 mg/dL (ref 5–40)

## 2019-10-02 LAB — VITAMIN B12: Vitamin B-12: 421 pg/mL (ref 232–1245)

## 2019-10-02 NOTE — Telephone Encounter (Signed)
I called and gave the # to [facility], [phone] for community resources/ support groups to Gaye Alken, daughter of pt.  She verbalized understanding.

## 2019-10-03 ENCOUNTER — Telehealth: Payer: Self-pay | Admitting: *Deleted

## 2019-10-03 NOTE — Telephone Encounter (Signed)
Called daughter, Christian Bennett (on [place]) to give lab results. She asked that I speak to her sister who was with patient in office visit. I spoke with Judson Roch and informed her that his recent lab work showed normal B12 levels and comparable levels of kidney function compared to prior lab. The NP has   forwarded results to his PCP as requested by her. Sarah verbalized understanding, appreciation.

## 2019-10-09 ENCOUNTER — Telehealth: Payer: Self-pay | Admitting: Family Medicine

## 2019-10-09 ENCOUNTER — Ambulatory Visit
Admission: RE | Admit: 2019-10-09 | Discharge: 2019-10-09 | Disposition: A | Discharge status: Home / Self Care | Payer: Medicare Other | Source: Ambulatory Visit | Attending: Adult Health | Admitting: Adult Health

## 2019-10-09 DIAGNOSIS — E039 Hypothyroidism, unspecified: Secondary | ICD-10-CM

## 2019-10-09 DIAGNOSIS — R29818 Other symptoms and signs involving the nervous system: Secondary | ICD-10-CM

## 2019-10-09 DIAGNOSIS — I63511 Cerebral infarction due to unspecified occlusion or stenosis of right middle cerebral artery: Secondary | ICD-10-CM

## 2019-10-09 MED ORDER — THYROID 60 MG PO TABS: ORAL_TABLET | ORAL | 0 refills | Status: DC

## 2019-10-09 NOTE — Telephone Encounter (Signed)
Patient's daughter called, stating that patient just has a virtual visit with PCP but is almost out of medication listed below, and she was questioning could he be changed from 30 days to 60 days. Please Advise. AM  thyroid (NP THYROID) 60 MG tablet    [place] ([place]), [place] [id number] (Phone) [phone] (Fax)

## 2019-10-09 NOTE — Telephone Encounter (Signed)
Please contact patient daughter and advise 90 day supply of thyroid med sent to requested pharmacy. AS, CMA

## 2019-10-09 NOTE — Telephone Encounter (Signed)
Patient's daughter advised of information. AM

## 2019-10-09 NOTE — Progress Notes (Signed)
I agree with the above plan 

## 2019-10-15 ENCOUNTER — Ambulatory Visit (INDEPENDENT_AMBULATORY_CARE_PROVIDER_SITE_OTHER): Payer: Medicare Other

## 2019-10-15 ENCOUNTER — Other Ambulatory Visit: Payer: Self-pay

## 2019-10-15 DIAGNOSIS — R410 Disorientation, unspecified: Secondary | ICD-10-CM | POA: Diagnosis not present

## 2019-10-16 ENCOUNTER — Telehealth: Payer: Self-pay | Admitting: *Deleted

## 2019-10-16 NOTE — Telephone Encounter (Signed)
Called daughter, [name] on [place] and informed her that patient's recent CT head did not show any evidence of acute infarct or abnormality. It is unchanged from CT done Oct 2020. She stated she will let her sister know,  verbalized understanding, appreciation.

## 2019-10-17 ENCOUNTER — Other Ambulatory Visit: Payer: Medicare Other

## 2019-10-17 ENCOUNTER — Telehealth: Payer: Self-pay | Admitting: Family Medicine

## 2019-10-17 MED ORDER — SERTRALINE HCL 25 MG PO TABS: 25.0000 mg | ORAL_TABLET | Freq: Every day | ORAL | 0 refills | Status: DC

## 2019-10-17 NOTE — Telephone Encounter (Signed)
Patient is requesting a refill of his Zoloft, they are requesting a 90 day supply per insurance. If approved please send to [facility] on [address]

## 2019-10-17 NOTE — Telephone Encounter (Signed)
Medication sent in as requested.  AS, CMA

## 2019-10-17 NOTE — Addendum Note (Signed)
Addended by: Mickel Crow on: 10/17/2019 01:22 PM   Modules accepted: Orders

## 2019-10-18 ENCOUNTER — Telehealth: Payer: Self-pay

## 2019-10-18 ENCOUNTER — Telehealth: Payer: Self-pay | Admitting: Family Medicine

## 2019-10-18 NOTE — Telephone Encounter (Signed)
I called daughters, Judson Roch and spoke to her relating to pts decline.  They have initiated talks with several memory care facility's, looking at [facility] on [address].  I relayed to have admissions director tell them what they need specifically to get the process started.  FL2 are completed by ? By NP.  I deferred daughter to get in touch with pcp.  She will do this and if needed will call us back.

## 2019-10-18 NOTE — Telephone Encounter (Signed)
Pt's daughter Lorna Few called states she had chosen to place him in a care facility & PCP should be getting some paperwork requiring documents, (ie med recs & or provider's signature ) on Monday if not sooner--- Daughter just wanted provider to know Juluis Rainier)  --forwarding to med asst to share w/ PCP.  -glh

## 2019-10-18 NOTE — Telephone Encounter (Signed)
Patient daughter Judson Roch states patient is up at night wandering around and his memory has decreased as well.  States patient has been continent with urine and feces and made a mess in his bedroom with it everywhere last night. States he is more confused and looking for people that he doesn't know.    They would like to go ahead and place him in a memory living facility and would like to know what steps they need to do in order to proceed with this decision. Please follow up.

## 2019-10-18 NOTE — Telephone Encounter (Signed)
Decline likely secondary to worsening dementia as multiple test previously performed which were largely unremarkable.  Would agree with placement in facility due to safety concerns.  Agree with reaching out to PCP in order to pursue filling out FL2 in order for placement.  Thank you.

## 2019-10-19 NOTE — Telephone Encounter (Signed)
Please review the below. AS, CMA

## 2019-10-21 NOTE — Telephone Encounter (Signed)
Once you see this paperwork, please pull it out of stacks of papers we get and set aside from me please

## 2019-10-22 ENCOUNTER — Other Ambulatory Visit: Payer: Self-pay | Admitting: Family Medicine

## 2019-10-22 ENCOUNTER — Telehealth: Payer: Self-pay | Admitting: *Deleted

## 2019-10-22 DIAGNOSIS — E559 Vitamin D deficiency, unspecified: Secondary | ICD-10-CM

## 2019-10-22 DIAGNOSIS — Z8639 Personal history of other endocrine, nutritional and metabolic disease: Secondary | ICD-10-CM

## 2019-10-22 MED ORDER — VITAMIN D (ERGOCALCIFEROL) 1.25 MG (50000 UNIT) PO CAPS: 50000.0000 [IU] | ORAL_CAPSULE | ORAL | 1 refills | Status: DC

## 2019-10-22 NOTE — Telephone Encounter (Signed)
Called and spoke with Collins,Rebecca (on [place]). Relayed results per Dr. Leonie Man note. She verbalized understanding.

## 2019-10-22 NOTE — Telephone Encounter (Signed)
Patient's daughter called states they need all his Rx filled for 90dys except the (Vit D)  Pt takes the Vit- D Rx once a month & is completely out.:  Pharmacy would NOT refill --Pt had Telehealth on 12/23 per daughter-----  Vitamin D, Ergocalciferol, (DRISDOL) 1.25 MG (50000 UT) CAPS capsule [id number]   Order Details Dose: 50,000 Units Route: Oral Frequency: Every 7 days  Dispense Quantity: 4 capsule Refills: 0       Sig: Take 1 capsule (50,000 Units total) by mouth every 7 (seven) days. **PATIENT NEEDS APT FOR FURTHER REFILLS**   --Forwarding request to medical asst to send refill order to :   Preferred [facility] [address] ([address]), [place] [id number] (Phone) [phone] (Fax)   --glh

## 2019-10-22 NOTE — Telephone Encounter (Signed)
-----   Message from Garvin Fila, MD sent at 10/19/2019  9:08 AM EST ----- Kindly inform patient that EEG shows mild slowing of brain waves which may be age related. No seizure activity seen

## 2019-10-23 DIAGNOSIS — Z111 Encounter for screening for respiratory tuberculosis: Secondary | ICD-10-CM | POA: Diagnosis not present

## 2019-10-23 DIAGNOSIS — Z20828 Contact with and (suspected) exposure to other viral communicable diseases: Secondary | ICD-10-CM | POA: Diagnosis not present

## 2019-10-23 NOTE — Telephone Encounter (Signed)
This form has been placed on your computer with a charge sheet attached. AS, CMA

## 2019-10-23 NOTE — Telephone Encounter (Signed)
I have done as requested below and put the form on my desk. AS, CMA

## 2019-10-23 NOTE — Telephone Encounter (Signed)
I already discussed it with Christian Bennett and Christian Bennett who is scheduling patient for an in person appointment.  He will need an in person examination and, there are several items in the packet of information that will need to be addressed.  Can you please just scan through and make a sticky note as to what exactly he needs during that visit.  I.e. most recent chest x-ray, TB test, pneumonia vaccines etc. etc.  That way we can be prepared.

## 2019-10-24 ENCOUNTER — Telehealth: Payer: Self-pay | Admitting: *Deleted

## 2019-10-24 NOTE — Telephone Encounter (Signed)
-----   Message from Frann Rider, NP sent at 10/18/2019 10:15 AM EST ----- Please advise patient that his recent EEG did not show any seizure activity and likely behaviors and sundowning likely due to cognitive impairment versus dementia as discussed during visit. Advised to continue Depakote 125 mg nightly and please advise daughter to call office if need of increase is indicated

## 2019-10-24 NOTE — Telephone Encounter (Signed)
These results were given to daughter by Gerald Stabs, RN on 10-22-19.

## 2019-10-25 ENCOUNTER — Ambulatory Visit (INDEPENDENT_AMBULATORY_CARE_PROVIDER_SITE_OTHER): Payer: Medicare Other | Admitting: Family Medicine

## 2019-10-25 ENCOUNTER — Other Ambulatory Visit: Payer: Self-pay

## 2019-10-25 ENCOUNTER — Encounter: Payer: Self-pay | Admitting: Family Medicine

## 2019-10-25 VITALS — BP 116/66 | HR 67 | Temp 98.9°F | Resp 14 | Ht 69.0 in | Wt 171.5 lb

## 2019-10-25 DIAGNOSIS — I6521 Occlusion and stenosis of right carotid artery: Secondary | ICD-10-CM

## 2019-10-25 DIAGNOSIS — E559 Vitamin D deficiency, unspecified: Secondary | ICD-10-CM | POA: Diagnosis not present

## 2019-10-25 DIAGNOSIS — D649 Anemia, unspecified: Secondary | ICD-10-CM

## 2019-10-25 DIAGNOSIS — Z111 Encounter for screening for respiratory tuberculosis: Secondary | ICD-10-CM | POA: Diagnosis not present

## 2019-10-25 DIAGNOSIS — E785 Hyperlipidemia, unspecified: Secondary | ICD-10-CM | POA: Diagnosis not present

## 2019-10-25 DIAGNOSIS — I10 Essential (primary) hypertension: Secondary | ICD-10-CM

## 2019-10-25 DIAGNOSIS — Z8673 Personal history of transient ischemic attack (TIA), and cerebral infarction without residual deficits: Secondary | ICD-10-CM | POA: Diagnosis not present

## 2019-10-25 DIAGNOSIS — E039 Hypothyroidism, unspecified: Secondary | ICD-10-CM | POA: Diagnosis not present

## 2019-10-25 DIAGNOSIS — Z751 Person awaiting admission to adequate facility elsewhere: Secondary | ICD-10-CM | POA: Diagnosis not present

## 2019-10-25 DIAGNOSIS — F09 Unspecified mental disorder due to known physiological condition: Secondary | ICD-10-CM | POA: Diagnosis not present

## 2019-10-25 DIAGNOSIS — Z8639 Personal history of other endocrine, nutritional and metabolic disease: Secondary | ICD-10-CM

## 2019-10-25 DIAGNOSIS — R413 Other amnesia: Secondary | ICD-10-CM

## 2019-10-25 DIAGNOSIS — F39 Unspecified mood [affective] disorder: Secondary | ICD-10-CM | POA: Diagnosis not present

## 2019-10-25 MED ORDER — VITAMIN D (ERGOCALCIFEROL) 1.25 MG (50000 UNIT) PO CAPS: 50000.0000 [IU] | ORAL_CAPSULE | ORAL | 1 refills | Status: DC

## 2019-10-25 MED ORDER — THYROID 60 MG PO TABS: ORAL_TABLET | ORAL | 0 refills | Status: DC

## 2019-10-25 MED ORDER — ATORVASTATIN CALCIUM 40 MG PO TABS: 40.0000 mg | ORAL_TABLET | Freq: Every day | ORAL | 0 refills | Status: DC

## 2019-10-25 MED ORDER — AMLODIPINE BESYLATE 5 MG PO TABS: ORAL_TABLET | ORAL | 0 refills | Status: DC

## 2019-10-25 MED ORDER — SERTRALINE HCL 25 MG PO TABS: 25.0000 mg | ORAL_TABLET | Freq: Every day | ORAL | 0 refills | Status: DC

## 2019-10-25 NOTE — Progress Notes (Signed)
Impression and Recommendations:    1. Encounter for person awaiting admission to residential aged care service   2. Anemia, unspecified type-recent onset   3. Hypothyroidism, unspecified type   4. Hyperlipidemia, unspecified hyperlipidemia type   5. Hypertension, unspecified type   6. History of CVA (cerebrovascular accident)   7. Cognitive dysfunction   8. Memory changes   9. Carotid artery stenosis, symptomatic, right   10. History of vitamin D deficiency   11. Vitamin D insufficiency   12. Mood disorder ([place])     -  Paperwork reviewed extensively with  patient's daughter Christian Bennett  and filled out face-to-face during appointment to her contentment.  - Extensive education and counseling provided to patient and caregiver today. - Lengthy conversation held with patient and his caregiver, who is also his daughter. - Discussed that patient will need medical team to oversee him at the facility.   - Discussed critical importance of patient continuing to be monitored by all established specialists, alongside care team located at assisted living facility.  - Discussed with pt's daughter Christian Bennett, that since he will be in a long term care facility, she needs to make sure medical staff is on site to care for pt/ write scripts etc.  She understands that any further refills of prescriptions will be per the patient's long-term care facility providers.  - Lab work and screening reviewed and orders placed.  Patient was interviewed and evaluated by me/ [facility] staff members in the clinic today for 50+ minutes, with over 50% of my time spent in face to face counseling of patients various medical conditions, treatment plans of those medical conditions including medicine management and lifestyle modification, strategies to improve health and well being; and in coordination of care.   SEE TREATMENT PLAN FOR DETAILS   Orders Placed This Encounter  Procedures  . CBC    Meds ordered this encounter   Medications  . atorvastatin (LIPITOR) 40 MG tablet    Sig: Take 1 tablet (40 mg total) by mouth daily at 6 PM. No more rfs-->  Future Refills per long term care facility pt is entering jan 25-27, 2021    Dispense:  30 tablet    Refill:  0  . Vitamin D, Ergocalciferol, (DRISDOL) 1.25 MG (50000 UNIT) CAPS capsule    Sig: Take 1 capsule (50,000 Units total) by mouth every 7 (seven) days. No more rfs-->  Future Refills per long term care facility pt is entering jan 25-27, 2021    Dispense:  12 capsule    Refill:  1  . thyroid (NP THYROID) 60 MG tablet    Sig: TAKE 1 TABLET BY MOUTH ONCE DAILY BEFORE BREAKFAST  No more rfs-->  Future Refills per long term care facility pt is entering jan 25-27, 2021    Dispense:  90 tablet    Refill:  0  . amLODipine (NORVASC) 5 MG tablet    Sig: 1 po qd No more rfs-->  Future Refills per long term care facility pt is entering jan 25-27, 2021    Dispense:  90 tablet    Refill:  0  . sertraline (ZOLOFT) 25 MG tablet    Sig: Take 1 tablet (25 mg total) by mouth daily. No more rfs-->  Future Refills per long term care facility pt is entering jan 25-27, 2021    Dispense:  90 tablet    Refill:  0    Medications Discontinued During This Encounter  Medication Reason  .  Triamcinolone Acetonide (TRIAMCINOLONE 0.1 % CREAM : EUCERIN) CREA   . fluticasone (FLONASE) 50 MCG/ACT nasal spray   . atorvastatin (LIPITOR) 40 MG tablet   . thyroid (NP THYROID) 60 MG tablet   . sertraline (ZOLOFT) 25 MG tablet   . Vitamin D, Ergocalciferol, (DRISDOL) 1.25 MG (50000 UNIT) CAPS capsule   . amLODipine (NORVASC) 5 MG tablet      Gross side effects, risk and benefits, and alternatives of medications and treatment plan in general discussed with patient.  Patient is aware that all medications have potential side effects and we are unable to predict every side effect or drug-drug interaction that may occur.   Patient will call with any questions prior to using medication if they  have concerns.    Expresses verbal understanding and consents to current therapy and treatment regimen.  No barriers to understanding were identified.  Red flag symptoms and signs discussed in detail.  Patient expressed understanding regarding what to do in case of emergency\urgent symptoms  Please see AVS handed out to patient at the end of our visit for further patient instructions/ counseling done pertaining to today's office visit.   --> Pt's future care per [facility] medical staff at long term care facility he is going in to for dementia care.     Note:  This note was prepared with assistance of Dragon voice recognition software. Occasional wrong-word or sound-a-like substitutions may have occurred due to the inherent limitations of voice recognition software.   This document serves as a record of services personally performed by Mellody Dance, DO. It was created on her behalf by Toni Amend, a trained medical scribe. The creation of this record is based on the scribe's personal observations and the provider's statements to them.   This case required medical decision making of at least moderate complexity. The above documentation has been reviewed to be accurate and was completed by Marjory Sneddon, D.O.     --------------------------------------------------------------------------------------------------------------------------------------------------------------------------------------------------------------------------------------------    Subjective:    Christian Bennett, am serving as scribe for Dr. Mellody Dance.  HPI: Christian Bennett is a 86 y.o. male who presents to [place] at [facility] today for issues as discussed below.  Patient's daughter notes he is moving into a Landscape [facility].  Daughter notes a team of a nurse practitioner and nurse oversee the facility in shifts.  Patient will be entering the facility next week.  He is  currently living with his daughter.  Daughter states "sometimes he knows me, sometimes he doesn't."  During appointment today, patient is unsure who his daughter is, unsure where he is.  He knows what town he is in.  He is unsure of the building in which he is in.  He doesn't know what day it is.  When asked what year he thinks it is, he says "I don't know."  He does not know who the president of the [place] is, but he knows the president is male.  Daughter feels his memory deterioration is definitely progressing.  Patient's daughter confirms that he follows up with neurology.  Daughter believes he is constantly disoriented, definitely a Animator.  He is not verbally abusive; daughter states "he is very pleasant."  He needs assistance with some activities but he "can pretty much do it by himself."  Patient is ambulatory; he has an assistive device but doesn't use it.  Daughter notes "he carries it with him."  He is normally not incontinent.  Daughter notes "  eggs upset his stomach sometimes; if it's cooked in food, it's fine."    Allergies  Allergen Reactions  . Doxycycline Hives  . Eggs Or Egg-Derived Products     UNSPECIFIED REACTION  >> "Sick"    Wt Readings from Last 3 Encounters:  10/25/19 171 lb 8 oz (77.8 kg)  10/01/19 164 lb (74.4 kg)  09/26/19 162 lb (73.5 kg)   BP Readings from Last 3 Encounters:  10/25/19 116/66  10/01/19 124/88  09/26/19 130/62   Pulse Readings from Last 3 Encounters:  10/25/19 67  10/01/19 69  09/26/19 64   BMI Readings from Last 3 Encounters:  10/25/19 25.33 kg/m  10/01/19 23.53 kg/m  09/26/19 23.24 kg/m     Patient Care Team    Relationship Specialty Notifications Start End  Mellody Dance, DO PCP - General Family Medicine  06/22/17   Gaynelle Arabian, MD Consulting Physician Orthopedic Surgery  06/22/17   Jovita Gamma, MD Consulting Physician Neurosurgery  06/22/17   Carol Ada, MD Consulting Physician Gastroenterology   06/22/17   Eustace Moore, MD Consulting Physician Neurosurgery  06/22/17   Early, Arvilla Meres, MD Consulting Physician Vascular Surgery  09/26/19   Frann Rider, NP Nurse Practitioner Neurology  10/08/19      Patient Active Problem List   Diagnosis Date Noted  . HTN (hypertension) 06/22/2017  . Chronic pancreatitis ([place]) 06/22/2017  . Mood disorder ([place]) 06/22/2017  . Hypothyroidism 06/22/2017  . GERD (gastroesophageal reflux disease) 06/22/2017  . Hx of colonoscopy 06/22/2017  . Cognitive dysfunction 10/25/2019  . History of CVA (cerebrovascular accident) 10/25/2019  . Anemia 10/25/2019  . Hyperlipidemia 09/26/2019  . Hypertension 09/26/2019  . TIA due to embolism ([place]) 09/26/2019  . H/O carotid endarterectomy 09/26/2019  . Carotid artery stenosis, symptomatic, right 07/25/2019  . CVA (cerebral vascular accident) ([place]) 07/11/2019  . TIA (transient ischemic attack) 07/10/2019  . Adjustment disorder with mixed anxiety and depressed mood 06/20/2018  . Vitamin D insufficiency 06/20/2018  . History of anemia 06/20/2018  . Medically noncompliant-   does not want to go for hearing eval 06/20/2018  . Bilateral impacted cerumen 02/07/2018  . Hearing difficulty of both ears 02/03/2018  . Environmental and seasonal allergies 02/03/2018  . Eustachian tube dysfunction, bilateral 02/03/2018  . History of vitamin D deficiency 01/30/2018  . Contact dermatitis and eczema 10/19/2017    Past Medical history, Surgical history, Family history, Social history, Allergies and Medications have been entered into the medical record, reviewed and changed as needed.    Current Meds  Medication Sig  . amLODipine (NORVASC) 5 MG tablet 1 po qd No more rfs-->  Future Refills per long term care facility pt is entering jan 25-27, 2021  . aspirin EC 81 MG EC tablet Take 1 tablet (81 mg total) by mouth daily.  Marland Kitchen atorvastatin (LIPITOR) 40 MG tablet Take 1 tablet (40 mg total) by mouth daily at 6 PM. No more rfs-->   Future Refills per long term care facility pt is entering jan 25-27, 2021  . divalproex (DEPAKOTE) 125 MG DR tablet Take 1 tablet (125 mg total) by mouth daily.  . lipase/protease/amylase (CREON) 36000 UNITS CPEP capsule Take 36,000 Units by mouth 3 (three) times daily with meals.  . magnesium oxide (MAG-OX) 400 (241.3 Mg) MG tablet Take 1 tablet (400 mg total) by mouth daily.  Earney Navy Bicarbonate (ZEGERID) 20-1100 MG CAPS capsule Take 1 capsule by mouth daily before breakfast.  . sertraline (ZOLOFT) 25 MG tablet Take 1  tablet (25 mg total) by mouth daily. No more rfs-->  Future Refills per long term care facility pt is entering jan 25-27, 2021  . thyroid (NP THYROID) 60 MG tablet TAKE 1 TABLET BY MOUTH ONCE DAILY BEFORE BREAKFAST  No more rfs-->  Future Refills per long term care facility pt is entering jan 25-27, 2021  . Vitamin D, Ergocalciferol, (DRISDOL) 1.25 MG (50000 UNIT) CAPS capsule Take 1 capsule (50,000 Units total) by mouth every 7 (seven) days. No more rfs-->  Future Refills per long term care facility pt is entering jan 25-27, 2021  . [DISCONTINUED] amLODipine (NORVASC) 5 MG tablet Take 1 tablet (5 mg total) by mouth daily.  . [DISCONTINUED] atorvastatin (LIPITOR) 40 MG tablet Take 1 tablet (40 mg total) by mouth daily at 6 PM.  . [DISCONTINUED] fluticasone (FLONASE) 50 MCG/ACT nasal spray 2 sprays each nostril after sinus rinse  . [DISCONTINUED] sertraline (ZOLOFT) 25 MG tablet Take 1 tablet (25 mg total) by mouth daily.  . [DISCONTINUED] thyroid (NP THYROID) 60 MG tablet TAKE 1 TABLET BY MOUTH ONCE DAILY BEFORE BREAKFAST  . [DISCONTINUED] Vitamin D, Ergocalciferol, (DRISDOL) 1.25 MG (50000 UNIT) CAPS capsule Take 1 capsule (50,000 Units total) by mouth every 7 (seven) days.    Allergies:  Allergies  Allergen Reactions  . Doxycycline Hives  . Eggs Or Egg-Derived Products     UNSPECIFIED REACTION  >> "Sick"     Review of Systems:  A fourteen system review of  systems was performed and found to be positive as per HPI.   Objective:   Blood pressure 116/66, pulse 67, temperature 98.9 F (37.2 C), temperature source Oral, resp. rate 14, height 5\' 9"  (1.753 m), weight 171 lb 8 oz (77.8 kg), SpO2 97 %. Body mass index is 25.33 kg/m. General:  Well Developed, well nourished, appropriate for stated age.  Neuro:  Alert and oriented,  extra-ocular muscles intact  HEENT:  Normocephalic, atraumatic, neck supple, no carotid bruits appreciated  Skin:  no gross rash, warm, pink. Cardiac:  RRR, S1 S2 Respiratory:  ECTA B/L and A/P, Not using accessory muscles, speaking in full sentences- unlabored. Vascular:  Ext warm, no cyanosis apprec.; cap RF less 2 sec. Psych:  No HI/SI, judgement and insight good, Euthymic mood. Full Affect.

## 2019-10-26 ENCOUNTER — Telehealth: Payer: Self-pay | Admitting: Adult Health

## 2019-10-26 ENCOUNTER — Other Ambulatory Visit: Payer: Self-pay | Admitting: Family Medicine

## 2019-10-26 DIAGNOSIS — Z862 Personal history of diseases of the blood and blood-forming organs and certain disorders involving the immune mechanism: Secondary | ICD-10-CM

## 2019-10-26 LAB — CBC
Hematocrit: 27.3 % — ABNORMAL LOW (ref 37.5–51.0)
Hemoglobin: 8.7 g/dL — ABNORMAL LOW (ref 13.0–17.7)
MCH: 26.4 pg — ABNORMAL LOW (ref 26.6–33.0)
MCHC: 31.9 g/dL (ref 31.5–35.7)
MCV: 83 fL (ref 79–97)
Platelets: 215 10*3/uL (ref 150–450)
RBC: 3.3 x10E6/uL — ABNORMAL LOW (ref 4.14–5.80)
RDW: 13.4 % (ref 11.6–15.4)
WBC: 6 10*3/uL (ref 3.4–10.8)

## 2019-10-26 NOTE — Telephone Encounter (Signed)
Pt's daughter Burman Freestone. On DPR called wanting to know if it would be ok to do the pt's appt coming up as a virtual due to the pt being put in a memory care facility and having to be quarantined. Or should it be r/s after his quarantine. Please advise.

## 2019-10-29 ENCOUNTER — Ambulatory Visit: Payer: Medicare Other | Admitting: Family Medicine

## 2019-10-29 NOTE — Telephone Encounter (Signed)
Called and left message for Judson Roch to call back.

## 2019-11-01 ENCOUNTER — Ambulatory Visit: Payer: Medicare Other | Admitting: Adult Health

## 2019-11-05 DIAGNOSIS — F0391 Unspecified dementia with behavioral disturbance: Secondary | ICD-10-CM | POA: Diagnosis not present

## 2019-11-05 DIAGNOSIS — E039 Hypothyroidism, unspecified: Secondary | ICD-10-CM | POA: Diagnosis not present

## 2019-11-05 DIAGNOSIS — E782 Mixed hyperlipidemia: Secondary | ICD-10-CM | POA: Diagnosis not present

## 2019-11-05 DIAGNOSIS — I10 Essential (primary) hypertension: Secondary | ICD-10-CM | POA: Diagnosis not present

## 2019-11-08 DIAGNOSIS — I10 Essential (primary) hypertension: Secondary | ICD-10-CM | POA: Diagnosis not present

## 2019-11-08 DIAGNOSIS — Z79899 Other long term (current) drug therapy: Secondary | ICD-10-CM | POA: Diagnosis not present

## 2019-11-08 DIAGNOSIS — E782 Mixed hyperlipidemia: Secondary | ICD-10-CM | POA: Diagnosis not present

## 2019-11-08 DIAGNOSIS — F0391 Unspecified dementia with behavioral disturbance: Secondary | ICD-10-CM | POA: Diagnosis not present

## 2019-11-08 DIAGNOSIS — E039 Hypothyroidism, unspecified: Secondary | ICD-10-CM | POA: Diagnosis not present

## 2019-11-09 DIAGNOSIS — F4323 Adjustment disorder with mixed anxiety and depressed mood: Secondary | ICD-10-CM | POA: Diagnosis not present

## 2019-11-09 DIAGNOSIS — I69398 Other sequelae of cerebral infarction: Secondary | ICD-10-CM | POA: Diagnosis not present

## 2019-11-09 DIAGNOSIS — Z7982 Long term (current) use of aspirin: Secondary | ICD-10-CM | POA: Diagnosis not present

## 2019-11-09 DIAGNOSIS — F0281 Dementia in other diseases classified elsewhere with behavioral disturbance: Secondary | ICD-10-CM | POA: Diagnosis not present

## 2019-11-09 DIAGNOSIS — Z8673 Personal history of transient ischemic attack (TIA), and cerebral infarction without residual deficits: Secondary | ICD-10-CM | POA: Diagnosis not present

## 2019-11-09 DIAGNOSIS — R269 Unspecified abnormalities of gait and mobility: Secondary | ICD-10-CM | POA: Diagnosis not present

## 2019-11-09 DIAGNOSIS — Z9183 Wandering in diseases classified elsewhere: Secondary | ICD-10-CM | POA: Diagnosis not present

## 2019-11-09 DIAGNOSIS — E039 Hypothyroidism, unspecified: Secondary | ICD-10-CM | POA: Diagnosis not present

## 2019-11-09 DIAGNOSIS — M810 Age-related osteoporosis without current pathological fracture: Secondary | ICD-10-CM | POA: Diagnosis not present

## 2019-11-09 DIAGNOSIS — E782 Mixed hyperlipidemia: Secondary | ICD-10-CM | POA: Diagnosis not present

## 2019-11-09 DIAGNOSIS — I10 Essential (primary) hypertension: Secondary | ICD-10-CM | POA: Diagnosis not present

## 2019-11-11 ENCOUNTER — Other Ambulatory Visit: Payer: Self-pay

## 2019-11-11 ENCOUNTER — Encounter (HOSPITAL_COMMUNITY): Payer: Self-pay

## 2019-11-11 ENCOUNTER — Emergency Department (HOSPITAL_COMMUNITY): Payer: Medicare Other

## 2019-11-11 ENCOUNTER — Inpatient Hospital Stay (HOSPITAL_COMMUNITY)
Admission: EM | Admit: 2019-11-11 | Discharge: 2019-11-14 | DRG: 369 | Disposition: A | Discharge status: Skilled Nursing Facility | Payer: Medicare Other | Source: Skilled Nursing Facility | Attending: Internal Medicine | Admitting: Internal Medicine

## 2019-11-11 DIAGNOSIS — K922 Gastrointestinal hemorrhage, unspecified: Secondary | ICD-10-CM | POA: Diagnosis not present

## 2019-11-11 DIAGNOSIS — Z20822 Contact with and (suspected) exposure to covid-19: Secondary | ICD-10-CM | POA: Diagnosis not present

## 2019-11-11 DIAGNOSIS — R42 Dizziness and giddiness: Secondary | ICD-10-CM | POA: Diagnosis not present

## 2019-11-11 DIAGNOSIS — I10 Essential (primary) hypertension: Secondary | ICD-10-CM

## 2019-11-11 DIAGNOSIS — D62 Acute posthemorrhagic anemia: Secondary | ICD-10-CM | POA: Diagnosis present

## 2019-11-11 DIAGNOSIS — Z823 Family history of stroke: Secondary | ICD-10-CM

## 2019-11-11 DIAGNOSIS — Z03818 Encounter for observation for suspected exposure to other biological agents ruled out: Secondary | ICD-10-CM | POA: Diagnosis not present

## 2019-11-11 DIAGNOSIS — Z85820 Personal history of malignant melanoma of skin: Secondary | ICD-10-CM

## 2019-11-11 DIAGNOSIS — Z91012 Allergy to eggs: Secondary | ICD-10-CM

## 2019-11-11 DIAGNOSIS — Z79899 Other long term (current) drug therapy: Secondary | ICD-10-CM

## 2019-11-11 DIAGNOSIS — K920 Hematemesis: Secondary | ICD-10-CM | POA: Diagnosis not present

## 2019-11-11 DIAGNOSIS — K861 Other chronic pancreatitis: Secondary | ICD-10-CM | POA: Diagnosis not present

## 2019-11-11 DIAGNOSIS — R404 Transient alteration of awareness: Secondary | ICD-10-CM | POA: Diagnosis not present

## 2019-11-11 DIAGNOSIS — Z87891 Personal history of nicotine dependence: Secondary | ICD-10-CM

## 2019-11-11 DIAGNOSIS — E039 Hypothyroidism, unspecified: Secondary | ICD-10-CM | POA: Diagnosis present

## 2019-11-11 DIAGNOSIS — R58 Hemorrhage, not elsewhere classified: Secondary | ICD-10-CM | POA: Diagnosis not present

## 2019-11-11 DIAGNOSIS — G309 Alzheimer's disease, unspecified: Secondary | ICD-10-CM | POA: Diagnosis not present

## 2019-11-11 DIAGNOSIS — Z8673 Personal history of transient ischemic attack (TIA), and cerebral infarction without residual deficits: Secondary | ICD-10-CM

## 2019-11-11 DIAGNOSIS — K449 Diaphragmatic hernia without obstruction or gangrene: Secondary | ICD-10-CM | POA: Diagnosis present

## 2019-11-11 DIAGNOSIS — Z66 Do not resuscitate: Secondary | ICD-10-CM | POA: Diagnosis present

## 2019-11-11 DIAGNOSIS — Z9842 Cataract extraction status, left eye: Secondary | ICD-10-CM

## 2019-11-11 DIAGNOSIS — Z9841 Cataract extraction status, right eye: Secondary | ICD-10-CM

## 2019-11-11 DIAGNOSIS — F0280 Dementia in other diseases classified elsewhere without behavioral disturbance: Secondary | ICD-10-CM | POA: Diagnosis not present

## 2019-11-11 DIAGNOSIS — R1111 Vomiting without nausea: Secondary | ICD-10-CM

## 2019-11-11 DIAGNOSIS — Z7982 Long term (current) use of aspirin: Secondary | ICD-10-CM

## 2019-11-11 DIAGNOSIS — N183 Chronic kidney disease, stage 3 unspecified: Secondary | ICD-10-CM

## 2019-11-11 DIAGNOSIS — D631 Anemia in chronic kidney disease: Secondary | ICD-10-CM | POA: Diagnosis present

## 2019-11-11 DIAGNOSIS — N1831 Chronic kidney disease, stage 3a: Secondary | ICD-10-CM | POA: Diagnosis present

## 2019-11-11 DIAGNOSIS — E785 Hyperlipidemia, unspecified: Secondary | ICD-10-CM | POA: Diagnosis present

## 2019-11-11 DIAGNOSIS — Z79890 Hormone replacement therapy (postmenopausal): Secondary | ICD-10-CM

## 2019-11-11 DIAGNOSIS — R1110 Vomiting, unspecified: Secondary | ICD-10-CM | POA: Diagnosis not present

## 2019-11-11 DIAGNOSIS — K2101 Gastro-esophageal reflux disease with esophagitis, with bleeding: Principal | ICD-10-CM | POA: Diagnosis present

## 2019-11-11 DIAGNOSIS — Z881 Allergy status to other antibiotic agents status: Secondary | ICD-10-CM

## 2019-11-11 DIAGNOSIS — Z96653 Presence of artificial knee joint, bilateral: Secondary | ICD-10-CM | POA: Diagnosis present

## 2019-11-11 DIAGNOSIS — I129 Hypertensive chronic kidney disease with stage 1 through stage 4 chronic kidney disease, or unspecified chronic kidney disease: Secondary | ICD-10-CM | POA: Diagnosis present

## 2019-11-11 LAB — CBC WITH DIFFERENTIAL/PLATELET
Abs Immature Granulocytes: 0.02 10*3/uL (ref 0.00–0.07)
Basophils Absolute: 0.1 10*3/uL (ref 0.0–0.1)
Basophils Relative: 1 %
Eosinophils Absolute: 0.1 10*3/uL (ref 0.0–0.5)
Eosinophils Relative: 2 %
HCT: 29.9 % — ABNORMAL LOW (ref 39.0–52.0)
Hemoglobin: 8.9 g/dL — ABNORMAL LOW (ref 13.0–17.0)
Immature Granulocytes: 0 %
Lymphocytes Relative: 11 %
Lymphs Abs: 1 10*3/uL (ref 0.7–4.0)
MCH: 26.3 pg (ref 26.0–34.0)
MCHC: 29.8 g/dL — ABNORMAL LOW (ref 30.0–36.0)
MCV: 88.2 fL (ref 80.0–100.0)
Monocytes Absolute: 0.8 10*3/uL (ref 0.1–1.0)
Monocytes Relative: 9 %
Neutro Abs: 7.1 10*3/uL (ref 1.7–7.7)
Neutrophils Relative %: 77 %
Platelets: 200 10*3/uL (ref 150–400)
RBC: 3.39 MIL/uL — ABNORMAL LOW (ref 4.22–5.81)
RDW: 14.7 % (ref 11.5–15.5)
WBC: 9.1 10*3/uL (ref 4.0–10.5)
nRBC: 0 % (ref 0.0–0.2)

## 2019-11-11 LAB — COMPREHENSIVE METABOLIC PANEL
ALT: 25 U/L (ref 0–44)
AST: 24 U/L (ref 15–41)
Albumin: 4.1 g/dL (ref 3.5–5.0)
Alkaline Phosphatase: 69 U/L (ref 38–126)
Anion gap: 9 (ref 5–15)
BUN: 37 mg/dL — ABNORMAL HIGH (ref 8–23)
CO2: 30 mmol/L (ref 22–32)
Calcium: 9.5 mg/dL (ref 8.9–10.3)
Chloride: 105 mmol/L (ref 98–111)
Creatinine, Ser: 1.3 mg/dL — ABNORMAL HIGH (ref 0.61–1.24)
GFR calc Af Amer: 56 mL/min — ABNORMAL LOW (ref >60)
GFR calc non Af Amer: 48 mL/min — ABNORMAL LOW (ref >60)
Glucose, Bld: 103 mg/dL — ABNORMAL HIGH (ref 70–99)
Potassium: 4.2 mmol/L (ref 3.5–5.1)
Sodium: 144 mmol/L (ref 135–145)
Total Bilirubin: 0.5 mg/dL (ref 0.3–1.2)
Total Protein: 7.4 g/dL (ref 6.5–8.1)

## 2019-11-11 LAB — MRSA PCR SCREENING: MRSA by PCR: NEGATIVE

## 2019-11-11 LAB — RESPIRATORY PANEL BY RT PCR (FLU A&B, COVID)
Influenza A by PCR: NEGATIVE
Influenza B by PCR: NEGATIVE
SARS Coronavirus 2 by RT PCR: NEGATIVE

## 2019-11-11 LAB — LIPASE, BLOOD: Lipase: 26 U/L (ref 11–51)

## 2019-11-11 LAB — POC OCCULT BLOOD, ED: Fecal Occult Bld: POSITIVE — AB

## 2019-11-11 MED ORDER — PANTOPRAZOLE SODIUM 40 MG IV SOLR
40.0000 mg | Freq: Once | INTRAVENOUS | Status: AC
  Administered 2019-11-11: 40 mg via INTRAVENOUS
  Filled 2019-11-11: qty 40

## 2019-11-11 MED ORDER — MAGNESIUM OXIDE 400 (241.3 MG) MG PO TABS
400.0000 mg | ORAL_TABLET | Freq: Every day | ORAL | Status: DC
  Administered 2019-11-11 – 2019-11-14 (×3): 400 mg via ORAL
  Filled 2019-11-11 (×2): qty 1

## 2019-11-11 MED ORDER — ONDANSETRON HCL 4 MG PO TABS: 4.0000 mg | ORAL_TABLET | Freq: Four times a day (QID) | ORAL | Status: DC | PRN

## 2019-11-11 MED ORDER — ATORVASTATIN CALCIUM 40 MG PO TABS
40.0000 mg | ORAL_TABLET | Freq: Every day | ORAL | Status: DC
  Administered 2019-11-11 – 2019-11-13 (×3): 40 mg via ORAL
  Filled 2019-11-11 (×3): qty 1

## 2019-11-11 MED ORDER — PANTOPRAZOLE SODIUM 40 MG PO TBEC
40.0000 mg | DELAYED_RELEASE_TABLET | Freq: Every day | ORAL | Status: DC
  Administered 2019-11-12 – 2019-11-14 (×2): 40 mg via ORAL
  Filled 2019-11-11: qty 1

## 2019-11-11 MED ORDER — FLUTICASONE PROPIONATE 50 MCG/ACT NA SUSP: 2.0000 | Freq: Every day | NASAL | Status: DC | PRN

## 2019-11-11 MED ORDER — PANCRELIPASE (LIP-PROT-AMYL) 12000-38000 UNITS PO CPEP
36000.0000 [IU] | ORAL_CAPSULE | Freq: Three times a day (TID) | ORAL | Status: DC
  Administered 2019-11-11 – 2019-11-14 (×7): 36000 [IU] via ORAL
  Filled 2019-11-11 (×4): qty 3
  Filled 2019-11-11: qty 1

## 2019-11-11 MED ORDER — ONDANSETRON HCL 4 MG/2ML IJ SOLN: 4.0000 mg | Freq: Four times a day (QID) | INTRAMUSCULAR | Status: DC | PRN

## 2019-11-11 MED ORDER — ACETAMINOPHEN 650 MG RE SUPP: 650.0000 mg | Freq: Four times a day (QID) | RECTAL | Status: DC | PRN

## 2019-11-11 MED ORDER — FLEET ENEMA 7-19 GM/118ML RE ENEM: 1.0000 | ENEMA | Freq: Once | RECTAL | Status: DC | PRN

## 2019-11-11 MED ORDER — AMLODIPINE BESYLATE 5 MG PO TABS
5.0000 mg | ORAL_TABLET | Freq: Every day | ORAL | Status: DC
  Administered 2019-11-11 – 2019-11-14 (×4): 5 mg via ORAL
  Filled 2019-11-11 (×3): qty 1

## 2019-11-11 MED ORDER — MAGNESIUM HYDROXIDE 400 MG/5ML PO SUSP: 30.0000 mL | Freq: Every day | ORAL | Status: DC | PRN

## 2019-11-11 MED ORDER — SENNA 8.6 MG PO TABS
1.0000 | ORAL_TABLET | Freq: Two times a day (BID) | ORAL | Status: DC
  Administered 2019-11-11 – 2019-11-14 (×4): 8.6 mg via ORAL
  Filled 2019-11-11 (×4): qty 1

## 2019-11-11 MED ORDER — DIVALPROEX SODIUM 125 MG PO DR TAB
125.0000 mg | DELAYED_RELEASE_TABLET | Freq: Every day | ORAL | Status: DC
  Administered 2019-11-11 – 2019-11-14 (×4): 125 mg via ORAL
  Filled 2019-11-11 (×4): qty 1

## 2019-11-11 MED ORDER — THYROID 60 MG PO TABS
60.0000 mg | ORAL_TABLET | Freq: Every day | ORAL | Status: DC
  Administered 2019-11-13 – 2019-11-14 (×2): 60 mg via ORAL
  Filled 2019-11-11 (×3): qty 1

## 2019-11-11 MED ORDER — SORBITOL 70 % SOLN
30.0000 mL | Freq: Every day | Status: DC | PRN
  Filled 2019-11-11: qty 30

## 2019-11-11 MED ORDER — SERTRALINE HCL 25 MG PO TABS
25.0000 mg | ORAL_TABLET | Freq: Every day | ORAL | Status: DC
  Administered 2019-11-11 – 2019-11-14 (×4): 25 mg via ORAL
  Filled 2019-11-11 (×3): qty 1

## 2019-11-11 MED ORDER — ACETAMINOPHEN 325 MG PO TABS: 650.0000 mg | ORAL_TABLET | Freq: Four times a day (QID) | ORAL | Status: DC | PRN

## 2019-11-11 MED ORDER — SODIUM CHLORIDE 0.9% FLUSH
3.0000 mL | Freq: Two times a day (BID) | INTRAVENOUS | Status: DC
  Administered 2019-11-12 – 2019-11-14 (×4): 3 mL via INTRAVENOUS

## 2019-11-11 NOTE — ED Provider Notes (Signed)
[facility] DEPT Provider Note   CSN: [id number] Arrival date & time: 11/11/19  1048     History Chief Complaint  Patient presents with  . Emesis    Christian Bennett is a 86 y.o. male.  86 year old male with prior medical history as detailed below presents for evaluation of reported emesis.  Patient resides at [facility].  Staff report that he vomited twice with production of "coffee ground" emesis.  Patient is pleasantly confused upon my initial evaluation.  He denies any specific complaint.  He does not recall any episodes of emesis earlier today.  He denies associated chest pain, nausea, vomiting, abdominal pain, or other specific complaint.  The history is provided by the patient and medical records.  Emesis Severity:  Mild Timing:  Sporadic Quality:  Coffee grounds Progression:  Resolved Chronicity:  New Relieved by:  Nothing Worsened by:  Nothing      Past Medical History:  Diagnosis Date  . Acid reflux   . Carotid artery occlusion   . Hypertension   . Hypothyroidism   . Thyroid disease     Patient Active Problem List   Diagnosis Date Noted  . Cognitive dysfunction 10/25/2019  . History of CVA (cerebrovascular accident) 10/25/2019  . Anemia 10/25/2019  . Hyperlipidemia 09/26/2019  . Hypertension 09/26/2019  . TIA due to embolism ([place]) 09/26/2019  . H/O carotid endarterectomy 09/26/2019  . Carotid artery stenosis, symptomatic, right 07/25/2019  . CVA (cerebral vascular accident) ([place]) 07/11/2019  . TIA (transient ischemic attack) 07/10/2019  . Adjustment disorder with mixed anxiety and depressed mood 06/20/2018  . Vitamin D insufficiency 06/20/2018  . History of anemia 06/20/2018  . Medically noncompliant-   does not want to go for hearing eval 06/20/2018  . Bilateral impacted cerumen 02/07/2018  . Hearing difficulty of both ears 02/03/2018  . Environmental and seasonal allergies 02/03/2018  . Eustachian tube dysfunction,  bilateral 02/03/2018  . History of vitamin D deficiency 01/30/2018  . Contact dermatitis and eczema 10/19/2017  . HTN (hypertension) 06/22/2017  . GERD (gastroesophageal reflux disease) 06/22/2017  . Hx of colonoscopy 06/22/2017  . Mood disorder ([place]) 06/22/2017  . Hypothyroidism 06/22/2017  . Chronic pancreatitis ([place]) 06/22/2017    Past Surgical History:  Procedure Laterality Date  . CAROTID ENDARTERECTOMY Right 07/25/2019  . COLONOSCOPY    . ENDARTERECTOMY Right 07/25/2019   Procedure: RIGHT ENDARTERECTOMY CAROTID with patch angioplasty;  Surgeon: Rosetta Posner, MD;  Location: [facility];  Service: Vascular;  Laterality: Right;  . ESOPHAGOGASTRODUODENOSCOPY    . EYE SURGERY Bilateral    cataracts  . INGUINAL HERNIA REPAIR    . LIPOMA EXCISION    . MELANOMA EXCISION    . REPLACEMENT TOTAL KNEE BILATERAL         Family History  Problem Relation Age of Onset  . Stroke Mother   . Stroke Father     Social History   Tobacco Use  . Smoking status: Former Smoker    Quit date: 1953    Years since quitting: 68.1  . Smokeless tobacco: Former Systems developer    Types: Chew  Substance Use Topics  . Alcohol use: Yes    Comment: 2oz Vodka a day - family regulates daily  . Drug use: No    Home Medications Prior to Admission medications   Medication Sig Start Date End Date Taking? Authorizing Provider  amLODipine (NORVASC) 5 MG tablet 1 po qd No more rfs-->  Future Refills per long term care facility pt  is entering jan 25-27, 2021 10/25/19   Mellody Dance, DO  aspirin EC 81 MG EC tablet Take 1 tablet (81 mg total) by mouth daily. 07/13/19   Geradine Girt, DO  atorvastatin (LIPITOR) 40 MG tablet Take 1 tablet (40 mg total) by mouth daily at 6 PM. No more rfs-->  Future Refills per long term care facility pt is entering jan 25-27, 2021 10/25/19   Opalski, Deborah, DO  divalproex (DEPAKOTE) 125 MG DR tablet Take 1 tablet (125 mg total) by mouth daily. 10/01/19   Frann Rider, NP    lipase/protease/amylase (CREON) 36000 UNITS CPEP capsule Take 36,000 Units by mouth 3 (three) times daily with meals.    [provider]  magnesium oxide (MAG-OX) 400 (241.3 Mg) MG tablet Take 1 tablet (400 mg total) by mouth daily. 07/12/19   Geradine Girt, DO  Omeprazole-Sodium Bicarbonate (ZEGERID) 20-1100 MG CAPS capsule Take 1 capsule by mouth daily before breakfast.    [provider]  sertraline (ZOLOFT) 25 MG tablet Take 1 tablet (25 mg total) by mouth daily. No more rfs-->  Future Refills per long term care facility pt is entering jan 25-27, 2021 10/25/19   Mellody Dance, DO  Skin Protectants, Misc. (EUCERIN) cream APPLY TWICE DAILY AS NEEDED FOR RASH OR ITCHING Patient not taking: No sig reported 12/04/18   Mellody Dance, DO  thyroid (NP THYROID) 60 MG tablet TAKE 1 TABLET BY MOUTH ONCE DAILY BEFORE BREAKFAST  No more rfs-->  Future Refills per long term care facility pt is entering jan 25-27, 2021 10/25/19   Opalski, Neoma Laming, DO  Vitamin D, Ergocalciferol, (DRISDOL) 1.25 MG (50000 UNIT) CAPS capsule Take 1 capsule (50,000 Units total) by mouth every 7 (seven) days. No more rfs-->  Future Refills per long term care facility pt is entering jan 25-27, 2021 10/25/19   Mellody Dance, DO    Allergies    Doxycycline and Eggs or egg-derived products  Review of Systems   Review of Systems  Gastrointestinal: Positive for vomiting.  All other systems reviewed and are negative.   Physical Exam Updated Vital Signs BP 121/78 (BP Location: Left Arm)   Pulse 75   Temp 98.2 F (36.8 C) (Oral)   Resp 20   SpO2 98%   Physical Exam Vitals and nursing note reviewed.  Constitutional:      General: He is not in acute distress.    Appearance: Normal appearance. He is well-developed.  HENT:     Head: Normocephalic and atraumatic.     Nose: Nose normal.     Mouth/Throat:     Mouth: Mucous membranes are moist.     Pharynx: Oropharynx is clear.  Eyes:      Conjunctiva/sclera: Conjunctivae normal.     Pupils: Pupils are equal, round, and reactive to light.  Cardiovascular:     Rate and Rhythm: Normal rate and regular rhythm.     Heart sounds: Normal heart sounds.  Pulmonary:     Effort: Pulmonary effort is normal. No respiratory distress.     Breath sounds: Normal breath sounds.  Abdominal:     General: There is no distension.     Palpations: Abdomen is soft.     Tenderness: There is no abdominal tenderness.  Musculoskeletal:        General: No deformity. Normal range of motion.     Cervical back: Normal range of motion and neck supple.  Skin:    General: Skin is warm and dry.  Neurological:  General: No focal deficit present.     Mental Status: He is alert and oriented to person, place, and time. Mental status is at baseline.     ED Results / Procedures / Treatments   Labs (all labs ordered are listed, but only abnormal results are displayed) Labs Reviewed  COMPREHENSIVE METABOLIC PANEL - Abnormal; Notable for the following components:      Result Value   Glucose, Bld 103 (*)    BUN 37 (*)    Creatinine, Ser 1.30 (*)    GFR calc non Af Amer 48 (*)    GFR calc Af Amer 56 (*)    All other components within normal limits  CBC WITH DIFFERENTIAL/PLATELET - Abnormal; Notable for the following components:   RBC 3.39 (*)    Hemoglobin 8.9 (*)    HCT 29.9 (*)    MCHC 29.8 (*)    All other components within normal limits  POC OCCULT BLOOD, ED - Abnormal; Notable for the following components:   Fecal Occult Bld POSITIVE (*)    All other components within normal limits  RESPIRATORY PANEL BY RT PCR (FLU A&B, COVID)  LIPASE, BLOOD  TYPE AND SCREEN    EKG EKG Interpretation  Date/Time:  Sunday November 11 2019 11:02:05 EST Ventricular Rate:  72 PR Interval:    QRS Duration: 101 QT Interval:  382 QTC Calculation: 418 R Axis:   -42 Text Interpretation: Sinus or ectopic atrial rhythm Prolonged PR interval Left axis deviation  Anterior infarct, old Confirmed by Dene Gentry [phone]) on 11/11/2019 11:26:30 AM   Radiology DG Chest Port 1 View  Result Date: 11/11/2019 CLINICAL DATA:  Coffee ground emesis. EXAM: PORTABLE CHEST 1 VIEW COMPARISON:  08/04/2009 FINDINGS: 11:15 a.m. The lungs are clear without focal pneumonia, edema, pneumothorax or pleural effusion. Interstitial markings are diffusely coarsened with chronic features. Cardiopericardial silhouette is at upper limits of normal for size. Bones are diffusely demineralized. Telemetry leads overlie the chest. IMPRESSION: No active disease. Electronically Signed   By: Misty Stanley M.D.   On: 11/11/2019 11:37    Procedures Procedures (including critical care time)  Medications Ordered in ED Medications - No data to display  ED Course  I have reviewed the triage vital signs and the nursing notes.  Pertinent labs & imaging results that were available during my care of the patient were reviewed by me and considered in my medical decision making (see chart for details).    MDM Rules/Calculators/A&P                      MDM  Screen complete  Christian Bennett was evaluated in Emergency Department on 11/11/2019 for the symptoms described in the history of present illness. He was evaluated in the context of the global COVID-19 pandemic, which necessitated consideration that the patient might be at risk for infection with the SARS-CoV-2 virus that causes COVID-19. Institutional protocols and algorithms that pertain to the evaluation of patients at risk for COVID-19 are in a state of rapid change based on information released by regulatory bodies including the CDC and federal and state organizations. These policies and algorithms were followed during the patient's care in the ED.  Patient is presenting for evaluation of possible upper GI bleed.  Patient was observed coffee-ground emesis.  Patient without further vomiting in the ED.  Initial labs are somewhat reassuring  with hemoglobin that appears to be close to his baseline.  Guaiac stool was positive.  Patient with  likely benefit from overnight observation and possible further work-up with GI.  Hospitalist service aware of case and will evaluate for admission.  Final Clinical Impression(s) / ED Diagnoses Final diagnoses:  Vomiting without nausea, intractability of vomiting not specified, unspecified vomiting type    Rx / DC Orders ED Discharge Orders    None       Valarie Merino, MD 11/11/19 1329

## 2019-11-11 NOTE — ED Notes (Signed)
primofit on pt

## 2019-11-11 NOTE — ED Notes (Signed)
His daughter is with him and is of great assistance to him. He is unable to call her by her name, but seems to recognize her.

## 2019-11-11 NOTE — H&P (Signed)
[facility] History and Physical  Christian Bennett [id number] DOB: [birth year] DOA: 11/11/2019 Referring physician: ED PCP: Mellody Dance, DO  Chief Complaint: Coffee-ground emesis Came from: [facility] ------------------------------------------------------------------------------------------------------ Assessment/Plan: Active Problems:   GI bleeding  Upper GI bleeding -Presented with 2 episodes of coffee-ground emesis -BUN somewhat elevated compared to baseline -FOBT positive -Per daughter, patient had EGD and colonoscopy done in 2017 by Dr. Benson Norway and was reportedly normal. -Observe overnight.  Repeat hemoglobin tomorrow.  If patient has further episodes of bleeding or drop in hemoglobin in a.m. lab, consider GI consultation with Dr. Benson Norway tomorrow. -Clear liquid diet for now.  N.p.o. after midnight.  Chronic anemia -Patient seems to have chronic normocytic anemia. -Hemoglobin today is 8.9 which is at his baseline of 8.7 from 2 weeks ago, platelet 200. -Monitor overnight for further bleeding. -I will keep aspirin on hold for now.  Alzheimer's dementia -Cheerfully demented.  Supportive care. -Continue Depakote, Zoloft  Cardiovascular issues: HTN, TIA, carotid artery occlusion s/p rt CEA -Home meds include amlodipine 5 mg, aspirin 81 mg, Lipitor 40 mg daily. -Continue amlodipine and Lipitor.  Keep aspirin on hold.   -Continue to monitor blood pressure.  Hypothyroidism -Continue Synthroid  GERD -Continue Prilosec.  Mobility: Ambulates with a walker Diet: Clear liquid diet DVT prophylaxis:  SCDs Code Status:  DNR/DNI.  Confirmed with daughter at bedside Family Communication:  Daughter at bedside Disposition Plan:  Observation status for now.  If further monitoring/intervention required, please switch to inpatient status.  ----------------------------------------------------------------------------------------------------- History of Present  Illness: Christian Bennett is a 86 y.o. male with PMH significant for Alzheimer's dementia, HTN, TIA, carotid artery occlusion s/p rt CEA, hypothyroidism, GERD who lives at [facility]. Patient was sent to the ED this morning after he had 2 episodes of ' coffee-ground looking' emesis.  In the ED, patient was presented confused and not able to give any details of the presentation. He is afebrile, hemodynamically stable, breathing comfortably on room air. Work-up showed WBC 9.1, hemoglobin 8.9 which is at his baseline of 8.7 from 2 weeks ago, platelet 200.  BMP with sodium 144, potassium 4.2, BUN/creatinine 37/1.3, liver enzymes normal. Fecal occult blood test positive. Hospitalist service was consulted for further evaluation and management.  At the time of my evaluation patient is pleasantly confused and is a poor historian.  His daughter at bedside helped with the history.  Patient was living with her until 2 weeks ago.  His dementia was getting worse and hence she had to put him in a memory care unit at [facility]. No report of other symptoms like fever, chest pain, shortness of breath, pain abdomen, diarrhea, focal neurological deficit.   Review of Systems:  All systems were reviewed and were negative unless otherwise mentioned in the HPI   Past medical history: Past Medical History:  Diagnosis Date  . Acid reflux   . Carotid artery occlusion   . Hypertension   . Hypothyroidism   . Thyroid disease     Past surgical history: Past Surgical History:  Procedure Laterality Date  . CAROTID ENDARTERECTOMY Right 07/25/2019  . COLONOSCOPY    . ENDARTERECTOMY Right 07/25/2019   Procedure: RIGHT ENDARTERECTOMY CAROTID with patch angioplasty;  Surgeon: Rosetta Posner, MD;  Location: [facility];  Service: Vascular;  Laterality: Right;  . ESOPHAGOGASTRODUODENOSCOPY    . EYE SURGERY Bilateral    cataracts  . INGUINAL HERNIA REPAIR    . LIPOMA EXCISION    . MELANOMA EXCISION    . REPLACEMENT  TOTAL  KNEE BILATERAL      Social History:  reports that he quit smoking about 68 years ago. He has quit using smokeless tobacco.  His smokeless tobacco use included chew. He reports current alcohol use. He reports that he does not use drugs.  Allergies:  Allergies  Allergen Reactions  . Doxycycline Hives  . Eggs Or Egg-Derived Products     UNSPECIFIED REACTION  >> "Sick"    Family history:  Family History  Problem Relation Age of Onset  . Stroke Mother   . Stroke Father      Home Meds: Prior to Admission medications   Medication Sig Start Date End Date Taking? Authorizing Provider  amLODipine (NORVASC) 5 MG tablet 1 po qd No more rfs-->  Future Refills per long term care facility pt is entering jan 25-27, 2021 Patient taking differently: Take 5 mg by mouth daily.  10/25/19  Yes Opalski, Neoma Laming, DO  aspirin EC 81 MG EC tablet Take 1 tablet (81 mg total) by mouth daily. 07/13/19  Yes Geradine Girt, DO  atorvastatin (LIPITOR) 40 MG tablet Take 1 tablet (40 mg total) by mouth daily at 6 PM. No more rfs-->  Future Refills per long term care facility pt is entering jan 25-27, 2021 10/25/19  Yes Opalski, Deborah, DO  divalproex (DEPAKOTE) 125 MG DR tablet Take 1 tablet (125 mg total) by mouth daily. 10/01/19  Yes McCue, Janett Billow, NP  fluticasone (FLONASE) 50 MCG/ACT nasal spray Place 2 sprays into both nostrils daily as needed for allergies or rhinitis.   Yes [provider]  lipase/protease/amylase (CREON) 36000 UNITS CPEP capsule Take 36,000 Units by mouth 3 (three) times daily with meals.   Yes [provider]  magnesium oxide (MAG-OX) 400 (241.3 Mg) MG tablet Take 1 tablet (400 mg total) by mouth daily. 07/12/19  Yes Vann, Tomi Bamberger, DO  Omeprazole-Sodium Bicarbonate (ZEGERID) 20-1100 MG CAPS capsule Take 1 capsule by mouth daily before breakfast.   Yes [provider]  sertraline (ZOLOFT) 25 MG tablet Take 1 tablet (25 mg total) by mouth daily. No more rfs-->   Future Refills per long term care facility pt is entering jan 25-27, 2021 10/25/19  Yes Opalski, Neoma Laming, DO  Skin Protectants, Misc. (EUCERIN) cream APPLY TWICE DAILY AS NEEDED FOR RASH OR ITCHING Patient taking differently: Apply 1 application topically every 12 (twelve) hours as needed (rash or itching).  12/04/18  Yes Opalski, Neoma Laming, DO  thyroid (NP THYROID) 60 MG tablet TAKE 1 TABLET BY MOUTH ONCE DAILY BEFORE BREAKFAST  No more rfs-->  Future Refills per long term care facility pt is entering jan 25-27, 2021 Patient taking differently: Take 60 mg by mouth daily before breakfast.  10/25/19  Yes Opalski, Neoma Laming, DO  triamcinolone cream (KENALOG) 0.1 % Apply 1 application topically as needed (rash).   Yes [provider]  Vitamin D, Ergocalciferol, (DRISDOL) 1.25 MG (50000 UNIT) CAPS capsule Take 1 capsule (50,000 Units total) by mouth every 7 (seven) days. No more rfs-->  Future Refills per long term care facility pt is entering jan 25-27, 2021 10/25/19  Yes Opalski, Deborah, DO    Physical Exam: Vitals:   11/11/19 1100 11/11/19 1200  BP: 121/78 139/67  Pulse: 73   Resp: 12 17  Temp: 98.2 F (36.8 C)   TempSrc: Oral   SpO2: 98%    Wt Readings from Last 3 Encounters:  10/25/19 77.8 kg  10/01/19 74.4 kg  09/26/19 73.5 kg   There is  no height or weight on file to calculate BMI.  General exam: Appears calm and comfortable.  Skin: No rashes, lesions or ulcers. HEENT: Atraumatic, normocephalic, supple neck, no obvious bleeding Lungs: Clear to auscultate bilaterally CVS: Regular rate and rhythm, no murmur GI/Abd soft, nondistended, nontender, bowel sound present CNS: Alert, awake, not oriented to place, person or time, not restless or agitated Psychiatry: Mood appropriate Extremities: No pedal edema, no calf tenderness     Consult Orders  (From admission, onward)         Start     Ordered   11/11/19 1312  Consult to hospitalist  ALL PATIENTS BEING ADMITTED/HAVING  PROCEDURES NEED COVID-19 SCREENING GI bleeding  Once    Comments: ALL PATIENTS BEING ADMITTED/HAVING PROCEDURES NEED COVID-19 SCREENING  GI bleeding  Provider:  (Not yet assigned)  Question Answer Comment  Place call to: [facility]   Reason for Consult Admit      11/11/19 1313   11/11/19 1257  Consult to hospitalist  ALL PATIENTS BEING ADMITTED/HAVING PROCEDURES NEED COVID-19 SCREENING GI bleed, hematemesis  Once    Comments: ALL PATIENTS BEING ADMITTED/HAVING PROCEDURES NEED COVID-19 SCREENING  GI bleed, hematemesis  Provider:  (Not yet assigned)  Question Answer Comment  Place call to: [facility]   Reason for Consult Admit      11/11/19 1256          Labs on Admission:   CBC: Recent Labs  Lab 11/11/19 1112  WBC 9.1  NEUTROABS 7.1  HGB 8.9*  HCT 29.9*  MCV 88.2  PLT [id number]    Basic Metabolic Panel: Recent Labs  Lab 11/11/19 1112  NA 144  K 4.2  CL 105  CO2 30  GLUCOSE 103*  BUN 37*  CREATININE 1.30*  CALCIUM 9.5    Liver Function Tests: Recent Labs  Lab 11/11/19 1112  AST 24  ALT 25  ALKPHOS 69  BILITOT 0.5  PROT 7.4  ALBUMIN 4.1   Recent Labs  Lab 11/11/19 1112  LIPASE 26   No results for input(s): AMMONIA in the last 168 hours.  Cardiac Enzymes: No results for input(s): CKTOTAL, CKMB, CKMBINDEX, TROPONINI in the last 168 hours.  BNP (last 3 results) No results for input(s): BNP in the last 8760 hours.  ProBNP (last 3 results) No results for input(s): PROBNP in the last 8760 hours.  CBG: No results for input(s): GLUCAP in the last 168 hours.  Lipase     Component Value Date/Time   LIPASE 26 11/11/2019 1112     Urinalysis    Component Value Date/Time   COLORURINE YELLOW 07/23/2019 1026   APPEARANCEUR CLEAR 07/23/2019 1026   LABSPEC 1.021 07/23/2019 1026   PHURINE 5.0 07/23/2019 1026   GLUCOSEU NEGATIVE 07/23/2019 1026   HGBUR NEGATIVE 07/23/2019 1026   BILIRUBINUR NEGATIVE 07/23/2019 1026   KETONESUR  NEGATIVE 07/23/2019 1026   PROTEINUR NEGATIVE 07/23/2019 1026   UROBILINOGEN 0.2 02/18/2011 1015   NITRITE NEGATIVE 07/23/2019 1026   LEUKOCYTESUR TRACE (A) 07/23/2019 1026     Drugs of Abuse     Component Value Date/Time   LABOPIA NONE DETECTED 07/10/2019 1705   COCAINSCRNUR NONE DETECTED 07/10/2019 1705   LABBENZ NONE DETECTED 07/10/2019 1705   AMPHETMU NONE DETECTED 07/10/2019 1705   THCU NONE DETECTED 07/10/2019 1705   LABBARB NONE DETECTED 07/10/2019 1705      Radiological Exams on Admission: DG Chest Port 1 View  Result Date: 11/11/2019 CLINICAL DATA:  Coffee ground emesis. EXAM: PORTABLE CHEST  1 VIEW COMPARISON:  08/04/2009 FINDINGS: 11:15 a.m. The lungs are clear without focal pneumonia, edema, pneumothorax or pleural effusion. Interstitial markings are diffusely coarsened with chronic features. Cardiopericardial silhouette is at upper limits of normal for size. Bones are diffusely demineralized. Telemetry leads overlie the chest. IMPRESSION: No active disease. Electronically Signed   By: Misty Stanley M.D.   On: 11/11/2019 11:37   ----------------------------------------------------------------------------------------------------------------------------------------------------------- Severity of Illness: The appropriate patient status for this patient is OBSERVATION. Observation status is judged to be reasonable and necessary in order to provide the required intensity of service to ensure the patient's safety. The patient's presenting symptoms, physical exam findings, and initial radiographic and laboratory data in the context of their medical condition is felt to place them at decreased risk for further clinical deterioration. Furthermore, it is anticipated that the patient will be medically stable for discharge from the hospital within 2 midnights of admission. The following factors support the patient status of observation.   " The patient's presenting symptoms include  coffee-ground emesis. " The physical exam findings include dementia, FOBT positive. " The initial radiographic and laboratory data are low hemoglobin, FOBT positive.    Signed, Terrilee Croak, MD [facility] 11/11/2019

## 2019-11-11 NOTE — ED Triage Notes (Signed)
Staff at [facility] saw pt. To have two episodes of "coffee-ground looking" emeses. He arrives awake, alert and confused at his baseline. Hx of dementia and CVA.

## 2019-11-11 NOTE — ED Notes (Signed)
I have just given report to Verdis Frederickson, RN on [place].

## 2019-11-11 NOTE — ED Notes (Signed)
He is in no distress and tells me he is "comfortable".

## 2019-11-12 DIAGNOSIS — K861 Other chronic pancreatitis: Secondary | ICD-10-CM | POA: Diagnosis present

## 2019-11-12 DIAGNOSIS — Z91012 Allergy to eggs: Secondary | ICD-10-CM | POA: Diagnosis not present

## 2019-11-12 DIAGNOSIS — Z9842 Cataract extraction status, left eye: Secondary | ICD-10-CM | POA: Diagnosis not present

## 2019-11-12 DIAGNOSIS — Z96653 Presence of artificial knee joint, bilateral: Secondary | ICD-10-CM | POA: Diagnosis present

## 2019-11-12 DIAGNOSIS — I10 Essential (primary) hypertension: Secondary | ICD-10-CM

## 2019-11-12 DIAGNOSIS — Z66 Do not resuscitate: Secondary | ICD-10-CM | POA: Diagnosis present

## 2019-11-12 DIAGNOSIS — K920 Hematemesis: Secondary | ICD-10-CM | POA: Diagnosis not present

## 2019-11-12 DIAGNOSIS — R531 Weakness: Secondary | ICD-10-CM | POA: Diagnosis not present

## 2019-11-12 DIAGNOSIS — K449 Diaphragmatic hernia without obstruction or gangrene: Secondary | ICD-10-CM | POA: Diagnosis present

## 2019-11-12 DIAGNOSIS — M2550 Pain in unspecified joint: Secondary | ICD-10-CM | POA: Diagnosis not present

## 2019-11-12 DIAGNOSIS — I129 Hypertensive chronic kidney disease with stage 1 through stage 4 chronic kidney disease, or unspecified chronic kidney disease: Secondary | ICD-10-CM | POA: Diagnosis present

## 2019-11-12 DIAGNOSIS — Z20822 Contact with and (suspected) exposure to covid-19: Secondary | ICD-10-CM | POA: Diagnosis present

## 2019-11-12 DIAGNOSIS — K2101 Gastro-esophageal reflux disease with esophagitis, with bleeding: Secondary | ICD-10-CM | POA: Diagnosis present

## 2019-11-12 DIAGNOSIS — Z7401 Bed confinement status: Secondary | ICD-10-CM | POA: Diagnosis not present

## 2019-11-12 DIAGNOSIS — Z79890 Hormone replacement therapy (postmenopausal): Secondary | ICD-10-CM | POA: Diagnosis not present

## 2019-11-12 DIAGNOSIS — E039 Hypothyroidism, unspecified: Secondary | ICD-10-CM | POA: Diagnosis present

## 2019-11-12 DIAGNOSIS — D631 Anemia in chronic kidney disease: Secondary | ICD-10-CM | POA: Diagnosis present

## 2019-11-12 DIAGNOSIS — K921 Melena: Secondary | ICD-10-CM | POA: Diagnosis not present

## 2019-11-12 DIAGNOSIS — N1831 Chronic kidney disease, stage 3a: Secondary | ICD-10-CM | POA: Diagnosis present

## 2019-11-12 DIAGNOSIS — D649 Anemia, unspecified: Secondary | ICD-10-CM | POA: Diagnosis not present

## 2019-11-12 DIAGNOSIS — Z823 Family history of stroke: Secondary | ICD-10-CM | POA: Diagnosis not present

## 2019-11-12 DIAGNOSIS — D509 Iron deficiency anemia, unspecified: Secondary | ICD-10-CM | POA: Diagnosis not present

## 2019-11-12 DIAGNOSIS — K2901 Acute gastritis with bleeding: Secondary | ICD-10-CM | POA: Diagnosis not present

## 2019-11-12 DIAGNOSIS — K2100 Gastro-esophageal reflux disease with esophagitis, without bleeding: Secondary | ICD-10-CM | POA: Diagnosis not present

## 2019-11-12 DIAGNOSIS — Z87891 Personal history of nicotine dependence: Secondary | ICD-10-CM | POA: Diagnosis not present

## 2019-11-12 DIAGNOSIS — F0280 Dementia in other diseases classified elsewhere without behavioral disturbance: Secondary | ICD-10-CM | POA: Diagnosis present

## 2019-11-12 DIAGNOSIS — Z9841 Cataract extraction status, right eye: Secondary | ICD-10-CM | POA: Diagnosis not present

## 2019-11-12 DIAGNOSIS — G309 Alzheimer's disease, unspecified: Secondary | ICD-10-CM | POA: Diagnosis present

## 2019-11-12 DIAGNOSIS — E559 Vitamin D deficiency, unspecified: Secondary | ICD-10-CM | POA: Diagnosis not present

## 2019-11-12 DIAGNOSIS — D62 Acute posthemorrhagic anemia: Secondary | ICD-10-CM | POA: Diagnosis present

## 2019-11-12 DIAGNOSIS — E785 Hyperlipidemia, unspecified: Secondary | ICD-10-CM | POA: Diagnosis present

## 2019-11-12 DIAGNOSIS — Z79899 Other long term (current) drug therapy: Secondary | ICD-10-CM | POA: Diagnosis not present

## 2019-11-12 DIAGNOSIS — K922 Gastrointestinal hemorrhage, unspecified: Secondary | ICD-10-CM | POA: Diagnosis present

## 2019-11-12 DIAGNOSIS — Z7982 Long term (current) use of aspirin: Secondary | ICD-10-CM | POA: Diagnosis not present

## 2019-11-12 DIAGNOSIS — Z881 Allergy status to other antibiotic agents status: Secondary | ICD-10-CM | POA: Diagnosis not present

## 2019-11-12 DIAGNOSIS — Z8673 Personal history of transient ischemic attack (TIA), and cerebral infarction without residual deficits: Secondary | ICD-10-CM | POA: Diagnosis not present

## 2019-11-12 LAB — CBC
HCT: 25.1 % — ABNORMAL LOW (ref 39.0–52.0)
Hemoglobin: 7.5 g/dL — ABNORMAL LOW (ref 13.0–17.0)
MCH: 26.3 pg (ref 26.0–34.0)
MCHC: 29.9 g/dL — ABNORMAL LOW (ref 30.0–36.0)
MCV: 88.1 fL (ref 80.0–100.0)
Platelets: 164 10*3/uL (ref 150–400)
RBC: 2.85 MIL/uL — ABNORMAL LOW (ref 4.22–5.81)
RDW: 14.8 % (ref 11.5–15.5)
WBC: 6.6 10*3/uL (ref 4.0–10.5)
nRBC: 0 % (ref 0.0–0.2)

## 2019-11-12 LAB — BASIC METABOLIC PANEL
Anion gap: 7 (ref 5–15)
BUN: 26 mg/dL — ABNORMAL HIGH (ref 8–23)
CO2: 29 mmol/L (ref 22–32)
Calcium: 9.1 mg/dL (ref 8.9–10.3)
Chloride: 105 mmol/L (ref 98–111)
Creatinine, Ser: 1.27 mg/dL — ABNORMAL HIGH (ref 0.61–1.24)
GFR calc Af Amer: 58 mL/min — ABNORMAL LOW (ref >60)
GFR calc non Af Amer: 50 mL/min — ABNORMAL LOW (ref >60)
Glucose, Bld: 89 mg/dL (ref 70–99)
Potassium: 3.9 mmol/L (ref 3.5–5.1)
Sodium: 141 mmol/L (ref 135–145)

## 2019-11-12 MED ORDER — MORPHINE SULFATE (PF) 2 MG/ML IV SOLN
1.0000 mg | Freq: Once | INTRAVENOUS | Status: AC
  Administered 2019-11-13: 1 mg via INTRAVENOUS
  Filled 2019-11-12: qty 1

## 2019-11-12 MED ORDER — HALOPERIDOL LACTATE 5 MG/ML IJ SOLN
2.0000 mg | Freq: Four times a day (QID) | INTRAMUSCULAR | Status: DC | PRN
  Administered 2019-11-12 – 2019-11-13 (×2): 2 mg via INTRAMUSCULAR
  Filled 2019-11-12 (×2): qty 1

## 2019-11-12 NOTE — Progress Notes (Signed)
Pt very active this evening, continuously attempting to get out of bed. Safety sitter unavailable. Pt in low bed w/ floor mats, near nursing station. On call O[facility] notfied and PRN order placed.

## 2019-11-12 NOTE — H&P (View-Only) (Signed)
Reason for Consult: Coffee-ground emesis with guaiac positive stools. Referring Physician: [facility].  Christian Bennett is an 86 y.o. male.  HPI: Christian Bennett is a 86 year old white male with multiple medical problems listed below was admitted with 2 episodes of coffee-ground emesis and was noted to have guaiac positive stools.  He has had an EGD and colonoscopy done by Dr. Benson Norway in 2017 that were normal.  Patient's hemoglobin dropped from 8.9 to 7.5 g/dL today and therefore GI consultation was requested  Past Medical History:  Diagnosis Date  . Acid reflux   . Carotid artery occlusion   . Hypertension   . Hypothyroidism   . Thyroid disease    Past Surgical History:  Procedure Laterality Date  . CAROTID ENDARTERECTOMY Right 07/25/2019  . COLONOSCOPY    . ENDARTERECTOMY Right 07/25/2019   Procedure: RIGHT ENDARTERECTOMY CAROTID with patch angioplasty;  Surgeon: Rosetta Posner, MD;  Location: [facility];  Service: Vascular;  Laterality: Right;  . ESOPHAGOGASTRODUODENOSCOPY    . EYE SURGERY Bilateral    cataracts  . INGUINAL HERNIA REPAIR    . LIPOMA EXCISION    . MELANOMA EXCISION    . REPLACEMENT TOTAL KNEE BILATERAL     Family History  Problem Relation Age of Onset  . Stroke Mother   . Stroke Father    Social History:  reports that he quit smoking about 68 years ago. He has quit using smokeless tobacco.  His smokeless tobacco use included chew. He reports current alcohol use. He reports that he does not use drugs.  Allergies:  Allergies  Allergen Reactions  . Doxycycline Hives  . Eggs Or Egg-Derived Products     UNSPECIFIED REACTION  >> "Sick"   Medications: I have reviewed the patient's current medications.  Results for orders placed or performed during the hospital encounter of 11/11/19 (from the past 48 hour(s))  Comprehensive metabolic panel     Status: Abnormal   Collection Time: 11/11/19 11:12 AM  Result Value Ref Range   Sodium 144 135 - 145 mmol/L   Potassium 4.2 3.5 - 5.1 mmol/L   Chloride 105 98 - 111 mmol/L   CO2 30 22 - 32 mmol/L   Glucose, Bld 103 (H) 70 - 99 mg/dL   BUN 37 (H) 8 - 23 mg/dL   Creatinine, Ser 1.30 (H) 0.61 - 1.24 mg/dL   Calcium 9.5 8.9 - 10.3 mg/dL   Total Protein 7.4 6.5 - 8.1 g/dL   Albumin 4.1 3.5 - 5.0 g/dL   AST 24 15 - 41 U/L   ALT 25 0 - 44 U/L   Alkaline Phosphatase 69 38 - 126 U/L   Total Bilirubin 0.5 0.3 - 1.2 mg/dL   GFR calc non Af Amer 48 (L) >60 mL/min   GFR calc Af Amer 56 (L) >60 mL/min   Anion gap 9 5 - 15    Comment: Performed at [facility], [place] [address] [place], [place] [address]  Lipase, blood     Status: None   Collection Time: 11/11/19 11:12 AM  Result Value Ref Range   Lipase 26 11 - 51 U/L    Comment: Performed at [facility],  [place] [address], [place], [place] [address]  CBC with Differential     Status: Abnormal   Collection Time: 11/11/19 11:12 AM  Result Value Ref Range   WBC 9.1 4.0 - 10.5 K/uL   RBC 3.39 (L) 4.22 - 5.81 MIL/uL   Hemoglobin 8.9 (L)  13.0 - 17.0 g/dL   HCT 29.9 (L) 39.0 - 52.0 %   MCV 88.2 80.0 - 100.0 fL   MCH 26.3 26.0 - 34.0 pg   MCHC 29.8 (L) 30.0 - 36.0 g/dL   RDW 14.7 11.5 - 15.5 %   Platelets 200 150 - 400 K/uL   nRBC 0.0 0.0 - 0.2 %   Neutrophils Relative % 77 %   Neutro Abs 7.1 1.7 - 7.7 K/uL   Lymphocytes Relative 11 %   Lymphs Abs 1.0 0.7 - 4.0 K/uL   Monocytes Relative 9 %   Monocytes Absolute 0.8 0.1 - 1.0 K/uL   Eosinophils Relative 2 %   Eosinophils Absolute 0.1 0.0 - 0.5 K/uL   Basophils Relative 1 %   Basophils Absolute 0.1 0.0 - 0.1 K/uL   Immature Granulocytes 0 %   Abs Immature Granulocytes 0.02 0.00 - 0.07 K/uL    Comment: Performed at [facility], [place] [address], [place], [place] [address]  Type and screen [place]     Status: None   Collection Time: 11/11/19 11:12 AM  Result Value Ref Range   ABO/RH(D) O POS    Antibody Screen POS     Sample Expiration 11/14/2019,2359    Antibody Identification WARM AUTOANTIBODY    DAT, IgG POS    Antibody ID,T [place]      WARM AUTOANTIBODY Performed at [facility], [place] [address], [place], [place] [address]   POC occult blood, ED RN will collect     Status: Abnormal   Collection Time: 11/11/19 11:38 AM  Result Value Ref Range   Fecal Occult Bld POSITIVE (A) NEGATIVE  Respiratory Panel by RT PCR (Flu A&B, Covid) - Nasopharyngeal Swab     Status: None   Collection Time: 11/11/19  1:45 PM   Specimen: Nasopharyngeal Swab  Result Value Ref Range   SARS Coronavirus 2 by RT PCR NEGATIVE NEGATIVE    Comment: (NOTE) SARS-CoV-2 target nucleic acids are NOT DETECTED. The SARS-CoV-2 RNA is generally detectable in upper respiratoy specimens during the acute phase of infection. The lowest concentration of SARS-CoV-2 viral copies this assay can detect is 131 copies/mL. A negative result does not preclude SARS-Cov-2 infection and should not be used as the sole basis for treatment or other patient management decisions. A negative result may occur with  improper specimen collection/handling, submission of specimen other than nasopharyngeal swab, presence of viral mutation(s) within the areas targeted by this assay, and inadequate number of viral copies (<131 copies/mL). A negative result must be combined with clinical observations, patient history, and epidemiological information. The expected result is Negative. Fact Sheet for Patients:  PinkCheek.be Fact Sheet for Healthcare Providers:  GravelBags.it This test is not yet ap proved or cleared by the [place] FDA and  has been authorized for detection and/or diagnosis of SARS-CoV-2 by FDA under an Emergency Use Authorization (EUA). This EUA will remain  in effect (meaning this test can be used) for the duration of the COVID-19 declaration under Section 564(b)(1)  of the Act, 21 U.S.C. section 360bbb-3(b)(1), unless the authorization is terminated or revoked sooner.    Influenza A by PCR NEGATIVE NEGATIVE   Influenza B by PCR NEGATIVE NEGATIVE    Comment: (NOTE) The Xpert Xpress SARS-CoV-2/FLU/RSV assay is intended as an aid in  the diagnosis of influenza from Nasopharyngeal swab specimens and  should not be used as a sole basis for treatment. Nasal washings and  aspirates  are unacceptable for Xpert Xpress SARS-CoV-2/FLU/RSV  testing. Fact Sheet for Patients: PinkCheek.be Fact Sheet for Healthcare Providers: GravelBags.it This test is not yet approved or cleared by the [place] FDA and  has been authorized for detection and/or diagnosis of SARS-CoV-2 by  FDA under an Emergency Use Authorization (EUA). This EUA will remain  in effect (meaning this test can be used) for the duration of the  Covid-19 declaration under Section 564(b)(1) of the Act, 21  U.S.C. section 360bbb-3(b)(1), unless the authorization is  terminated or revoked. Performed at [facility], [place] [address], [place], [place] [address]   MRSA PCR Screening     Status: None   Collection Time: 11/11/19  7:43 PM   Specimen: Nasal Mucosa; Nasopharyngeal  Result Value Ref Range   MRSA by PCR NEGATIVE NEGATIVE    Comment:        The GeneXpert MRSA Assay (FDA approved for NASAL specimens only), is one component of a comprehensive MRSA colonization surveillance program. It is not intended to diagnose MRSA infection nor to guide or monitor treatment for MRSA infections. Performed at [facility], [place] [address] [place], [place] [id number]   Basic metabolic panel     Status: Abnormal   Collection Time: 11/12/19  5:19 AM  Result Value Ref Range   Sodium 141 135 - 145 mmol/L   Potassium 3.9 3.5 - 5.1 mmol/L   Chloride 105 98 - 111 mmol/L   CO2 29 22 - 32 mmol/L   Glucose, Bld  89 70 - 99 mg/dL   BUN 26 (H) 8 - 23 mg/dL   Creatinine, Ser 1.27 (H) 0.61 - 1.24 mg/dL   Calcium 9.1 8.9 - 10.3 mg/dL   GFR calc non Af Amer 50 (L) >60 mL/min   GFR calc Af Amer 58 (L) >60 mL/min   Anion gap 7 5 - 15    Comment: Performed at [place] [address] [place], [place] [address]  CBC     Status: Abnormal   Collection Time: 11/12/19  5:19 AM  Result Value Ref Range   WBC 6.6 4.0 - 10.5 K/uL   RBC 2.85 (L) 4.22 - 5.81 MIL/uL   Hemoglobin 7.5 (L) 13.0 - 17.0 g/dL   HCT 25.1 (L) 39.0 - 52.0 %   MCV 88.1 80.0 - 100.0 fL   MCH 26.3 26.0 - 34.0 pg   MCHC 29.9 (L) 30.0 - 36.0 g/dL   RDW 14.8 11.5 - 15.5 %   Platelets 164 150 - 400 K/uL   nRBC 0.0 0.0 - 0.2 %    Comment: Performed at [facility], [place] [address], [place], [place] [address]   DG Chest Port 1 View  Result Date: 11/11/2019 CLINICAL DATA:  Coffee ground emesis. EXAM: PORTABLE CHEST 1 VIEW COMPARISON:  08/04/2009 FINDINGS: 11:15 a.m. The lungs are clear without focal pneumonia, edema, pneumothorax or pleural effusion. Interstitial markings are diffusely coarsened with chronic features. Cardiopericardial silhouette is at upper limits of normal for size. Bones are diffusely demineralized. Telemetry leads overlie the chest. IMPRESSION: No active disease. Electronically Signed   By: Misty Stanley M.D.   On: 11/11/2019 11:37   Review of Systems Blood pressure 133/74, pulse 63, temperature 98.4 F (36.9 C), temperature source Oral, resp. rate 17, height 5\' 9"  (1.753 m), weight 76.2 kg, SpO2 100 %. Physical Exam  Constitutional: He is oriented to person, place, and time. He appears well-developed and well-nourished.  HENT:  Head: Normocephalic and atraumatic.  Eyes: Pupils are equal, round, and reactive to light. Conjunctivae and EOM are normal.  Cardiovascular: Normal rate and regular rhythm.  Respiratory: Effort normal and breath sounds normal.  GI: Soft. Bowel sounds are  normal.  Musculoskeletal:     Cervical back: Normal range of motion and neck supple.  Neurological: He is alert and oriented to person, place, and time.  Skin: Skin is warm and dry.  Psychiatric: He has a normal mood and affect. His behavior is normal. Judgment and thought content normal.   Assessment/Plan: 1) Coffee-ground emesis with melena and acute on chronic anemia-an EGD is planned for tomorrow. Further recommendation made thereafter. 2) Hypothyroidism. 3) Hypertension. 4) Alzheimer's dementia. 5) Carotid artery stenosis. Juanita Craver 11/12/2019, 11:54 AM

## 2019-11-12 NOTE — Progress Notes (Signed)
PROGRESS NOTE    Christian Bennett  [id number] DOB: 07-31-1928 DOA: 11/11/2019 PCP: Mellody Dance, DO   Brief Narrative: Christian Bennett is a 86 y.o. male with PMH significant for Alzheimer's dementia, HTN, TIA, carotid artery occlusion s/p rt CEA, hypothyroidism, GERD. Patient presented secondary to coffee ground emesis concerning for upper GI bleeding. Associated positive FOBT and acute anemia. GI consulted.   Assessment & Plan:   Active Problems:   GI bleeding   Upper GI bleed   Coffee-ground emesis GI bleeding Upper GI bleed 2 episodes at patient's skilled nursing facility.  Per daughter, patient has a history of heavy alcohol use.  It is only recently that he is decreased his alcohol intake secondary to worsening dementia.  Hemoglobin has trended down from yesterday and is currently 7.5.  Patient appears to have acute on chronic anemia.  Anemia appears to have started 4 months ago.  Patient follows with Dr. Benson Norway as an outpatient.  No recurrent hematemesis and no hematochezia or melena noted during hospitalization. FOBT positive. -Gastroenterology recommendations: Plan for endoscopy on 2/9 and will see patient today -Clear liquid diet and make n.p.o. after midnight tonight -Repeat CBC this afternoon and transfuse for hemoglobin less than 7  Acute on chronic anemia Acute blood loss anemia As mentioned above  Alzheimer's dementia Patient gets agitated during the evening and sundowns. -Continue Depakote and Zoloft  Hypothyroidism -Continue thyroid tablet 60 mg daily  Essential hypertension Patient is on amlodipine as an outpatient -Continue amlodipine  History of TIA -Continue   Carotid artery stenosis S/p right CEA. On aspirin as an outpatient -Hold aspirin secondary to GI bleeding.   DVT prophylaxis: SCDs Code Status:   Code Status: DNR Family Communication: Daughter at bedside Disposition Plan: Discharge per GI recommendations   Consultants:    Gastroenterology  Procedures:   None  Antimicrobials:  None    Subjective: Agitation overnight. No currently pain.  Objective: Vitals:   11/11/19 1517 11/11/19 1555 11/11/19 1958 11/12/19 0536  BP: (!) 146/71 (!) 146/71 137/62 133/74  Pulse: 65 65 64 63  Resp: 16 16 16 17   Temp: 98.8 F (37.1 C) 98.8 F (37.1 C) 97.9 F (36.6 C) 98.4 F (36.9 C)  TempSrc: Oral Oral Oral Oral  SpO2: 100%  100% 100%  Weight:  76.2 kg    Height:  5\' 9"  (1.753 m)      Intake/Output Summary (Last 24 hours) at 11/12/2019 1242 Last data filed at 11/12/2019 0537 Gross per 24 hour  Intake --  Output 400 ml  Net -400 ml   Filed Weights   11/11/19 1555  Weight: 76.2 kg    Examination:  General exam: Appears calm and comfortable Respiratory system: Clear to auscultation. Respiratory effort normal. Cardiovascular system: S1 & S2 heard, RRR. No murmurs, rubs, gallops or clicks. Gastrointestinal system: Abdomen is nondistended, soft and nontender. No organomegaly or masses felt. Normal bowel sounds heard. Central nervous system: Alert and oriented to self. No focal neurological deficits. Extremities: No edema. No calf tenderness Skin: No cyanosis. No rashes Psychiatry: Judgement and insight appear impaired.    Data Reviewed: I have personally reviewed following labs and imaging studies  CBC: Recent Labs  Lab 11/11/19 1112 11/12/19 0519  WBC 9.1 6.6  NEUTROABS 7.1  --   HGB 8.9* 7.5*  HCT 29.9* 25.1*  MCV 88.2 88.1  PLT 200 [id number]   Basic Metabolic Panel: Recent Labs  Lab 11/11/19 1112 11/12/19 0519  NA 144 141  K 4.2 3.9  CL 105 105  CO2 30 29  GLUCOSE 103* 89  BUN 37* 26*  CREATININE 1.30* 1.27*  CALCIUM 9.5 9.1   GFR: Estimated Creatinine Clearance: 39.4 mL/min (A) (by C-G formula based on SCr of 1.27 mg/dL (H)). Liver Function Tests: Recent Labs  Lab 11/11/19 1112  AST 24  ALT 25  ALKPHOS 69  BILITOT 0.5  PROT 7.4  ALBUMIN 4.1   Recent Labs  Lab  11/11/19 1112  LIPASE 26   No results for input(s): AMMONIA in the last 168 hours. Coagulation Profile: No results for input(s): INR, PROTIME in the last 168 hours. Cardiac Enzymes: No results for input(s): CKTOTAL, CKMB, CKMBINDEX, TROPONINI in the last 168 hours. BNP (last 3 results) No results for input(s): PROBNP in the last 8760 hours. HbA1C: No results for input(s): HGBA1C in the last 72 hours. CBG: No results for input(s): GLUCAP in the last 168 hours. Lipid Profile: No results for input(s): CHOL, HDL, LDLCALC, TRIG, CHOLHDL, LDLDIRECT in the last 72 hours. Thyroid Function Tests: No results for input(s): TSH, T4TOTAL, FREET4, T3FREE, THYROIDAB in the last 72 hours. Anemia Panel: No results for input(s): VITAMINB12, FOLATE, FERRITIN, TIBC, IRON, RETICCTPCT in the last 72 hours. Sepsis Labs: No results for input(s): PROCALCITON, LATICACIDVEN in the last 168 hours.  Recent Results (from the past 240 hour(s))  Respiratory Panel by RT PCR (Flu A&B, Covid) - Nasopharyngeal Swab     Status: None   Collection Time: 11/11/19  1:45 PM   Specimen: Nasopharyngeal Swab  Result Value Ref Range Status   SARS Coronavirus 2 by RT PCR NEGATIVE NEGATIVE Final    Comment: (NOTE) SARS-CoV-2 target nucleic acids are NOT DETECTED. The SARS-CoV-2 RNA is generally detectable in upper respiratoy specimens during the acute phase of infection. The lowest concentration of SARS-CoV-2 viral copies this assay can detect is 131 copies/mL. A negative result does not preclude SARS-Cov-2 infection and should not be used as the sole basis for treatment or other patient management decisions. A negative result may occur with  improper specimen collection/handling, submission of specimen other than nasopharyngeal swab, presence of viral mutation(s) within the areas targeted by this assay, and inadequate number of viral copies (<131 copies/mL). A negative result must be combined with clinical observations,  patient history, and epidemiological information. The expected result is Negative. Fact Sheet for Patients:  PinkCheek.be Fact Sheet for Healthcare Providers:  GravelBags.it This test is not yet ap proved or cleared by the [place] FDA and  has been authorized for detection and/or diagnosis of SARS-CoV-2 by FDA under an Emergency Use Authorization (EUA). This EUA will remain  in effect (meaning this test can be used) for the duration of the COVID-19 declaration under Section 564(b)(1) of the Act, 21 U.S.C. section 360bbb-3(b)(1), unless the authorization is terminated or revoked sooner.    Influenza A by PCR NEGATIVE NEGATIVE Final   Influenza B by PCR NEGATIVE NEGATIVE Final    Comment: (NOTE) The Xpert Xpress SARS-CoV-2/FLU/RSV assay is intended as an aid in  the diagnosis of influenza from Nasopharyngeal swab specimens and  should not be used as a sole basis for treatment. Nasal washings and  aspirates are unacceptable for Xpert Xpress SARS-CoV-2/FLU/RSV  testing. Fact Sheet for Patients: PinkCheek.be Fact Sheet for Healthcare Providers: GravelBags.it This test is not yet approved or cleared by the [place] FDA and  has been authorized for detection and/or diagnosis of SARS-CoV-2 by  FDA under an Emergency Use Authorization (EUA). This EUA will remain  in effect (meaning this test can be used) for the duration of the  Covid-19 declaration under Section 564(b)(1) of the Act, 21  U.S.C. section 360bbb-3(b)(1), unless the authorization is  terminated or revoked. Performed at [facility], [place] [address] [place], [place] [address]   MRSA PCR Screening     Status: None   Collection Time: 11/11/19  7:43 PM   Specimen: Nasal Mucosa; Nasopharyngeal  Result Value Ref Range Status   MRSA by PCR NEGATIVE NEGATIVE Final    Comment:        The  GeneXpert MRSA Assay (FDA approved for NASAL specimens only), is one component of a comprehensive MRSA colonization surveillance program. It is not intended to diagnose MRSA infection nor to guide or monitor treatment for MRSA infections. Performed at [facility], [place] [address], [place], [place] [address]          Radiology Studies: [facility] Chest Port 1 View  Result Date: 11/11/2019 CLINICAL DATA:  Coffee ground emesis. EXAM: PORTABLE CHEST 1 VIEW COMPARISON:  08/04/2009 FINDINGS: 11:15 a.m. The lungs are clear without focal pneumonia, edema, pneumothorax or pleural effusion. Interstitial markings are diffusely coarsened with chronic features. Cardiopericardial silhouette is at upper limits of normal for size. Bones are diffusely demineralized. Telemetry leads overlie the chest. IMPRESSION: No active disease. Electronically Signed   By: Misty Stanley M.D.   On: 11/11/2019 11:37        Scheduled Meds: . amLODipine  5 mg Oral Daily  . atorvastatin  40 mg Oral q1800  . divalproex  125 mg Oral Daily  . lipase/protease/amylase  36,000 Units Oral TID WC  . magnesium oxide  400 mg Oral Daily  . pantoprazole  40 mg Oral Daily  . senna  1 tablet Oral BID  . sertraline  25 mg Oral Daily  . sodium chloride flush  3 mL Intravenous Q12H  . thyroid  60 mg Oral QAC breakfast   Continuous Infusions:   LOS: 0 days     Cordelia Poche, MD [facility] 11/12/2019, 12:42 PM  If 7PM-7AM, please contact night-coverage [email or web address]

## 2019-11-12 NOTE — Progress Notes (Signed)
Pt pulling at arm bands. Blood bank band removed by pt. Blood bank band applied to Right Ankle so pt will not pull off again.

## 2019-11-12 NOTE — Consult Note (Signed)
Reason for Consult: Coffee-ground emesis with guaiac positive stools. Referring Physician: [facility].  Christian Bennett is an 86 y.o. male.  HPI: Christian Bennett is a 86 year old white male with multiple medical problems listed below was admitted with 2 episodes of coffee-ground emesis and was noted to have guaiac positive stools.  He has had an EGD and colonoscopy done by Dr. Benson Norway in 2017 that were normal.  Patient's hemoglobin dropped from 8.9 to 7.5 g/dL today and therefore GI consultation was requested  Past Medical History:  Diagnosis Date  . Acid reflux   . Carotid artery occlusion   . Hypertension   . Hypothyroidism   . Thyroid disease    Past Surgical History:  Procedure Laterality Date  . CAROTID ENDARTERECTOMY Right 07/25/2019  . COLONOSCOPY    . ENDARTERECTOMY Right 07/25/2019   Procedure: RIGHT ENDARTERECTOMY CAROTID with patch angioplasty;  Surgeon: Rosetta Posner, MD;  Location: [facility];  Service: Vascular;  Laterality: Right;  . ESOPHAGOGASTRODUODENOSCOPY    . EYE SURGERY Bilateral    cataracts  . INGUINAL HERNIA REPAIR    . LIPOMA EXCISION    . MELANOMA EXCISION    . REPLACEMENT TOTAL KNEE BILATERAL     Family History  Problem Relation Age of Onset  . Stroke Mother   . Stroke Father    Social History:  reports that he quit smoking about 68 years ago. He has quit using smokeless tobacco.  His smokeless tobacco use included chew. He reports current alcohol use. He reports that he does not use drugs.  Allergies:  Allergies  Allergen Reactions  . Doxycycline Hives  . Eggs Or Egg-Derived Products     UNSPECIFIED REACTION  >> "Sick"   Medications: I have reviewed the patient's current medications.  Results for orders placed or performed during the hospital encounter of 11/11/19 (from the past 48 hour(s))  Comprehensive metabolic panel     Status: Abnormal   Collection Time: 11/11/19 11:12 AM  Result Value Ref Range   Sodium 144 135 - 145 mmol/L   Potassium 4.2 3.5 - 5.1 mmol/L   Chloride 105 98 - 111 mmol/L   CO2 30 22 - 32 mmol/L   Glucose, Bld 103 (H) 70 - 99 mg/dL   BUN 37 (H) 8 - 23 mg/dL   Creatinine, Ser 1.30 (H) 0.61 - 1.24 mg/dL   Calcium 9.5 8.9 - 10.3 mg/dL   Total Protein 7.4 6.5 - 8.1 g/dL   Albumin 4.1 3.5 - 5.0 g/dL   AST 24 15 - 41 U/L   ALT 25 0 - 44 U/L   Alkaline Phosphatase 69 38 - 126 U/L   Total Bilirubin 0.5 0.3 - 1.2 mg/dL   GFR calc non Af Amer 48 (L) >60 mL/min   GFR calc Af Amer 56 (L) >60 mL/min   Anion gap 9 5 - 15    Comment: Performed at [facility], [place] [address] [place], [place] [address]  Lipase, blood     Status: None   Collection Time: 11/11/19 11:12 AM  Result Value Ref Range   Lipase 26 11 - 51 U/L    Comment: Performed at [facility], [place] [address], [place], [place] [address]  CBC with Differential     Status: Abnormal   Collection Time: 11/11/19 11:12 AM  Result Value Ref Range   WBC 9.1 4.0 - 10.5 K/uL   RBC 3.39 (L) 4.22 - 5.81 MIL/uL   Hemoglobin 8.9 (L)  13.0 - 17.0 g/dL   HCT 29.9 (L) 39.0 - 52.0 %   MCV 88.2 80.0 - 100.0 fL   MCH 26.3 26.0 - 34.0 pg   MCHC 29.8 (L) 30.0 - 36.0 g/dL   RDW 14.7 11.5 - 15.5 %   Platelets 200 150 - 400 K/uL   nRBC 0.0 0.0 - 0.2 %   Neutrophils Relative % 77 %   Neutro Abs 7.1 1.7 - 7.7 K/uL   Lymphocytes Relative 11 %   Lymphs Abs 1.0 0.7 - 4.0 K/uL   Monocytes Relative 9 %   Monocytes Absolute 0.8 0.1 - 1.0 K/uL   Eosinophils Relative 2 %   Eosinophils Absolute 0.1 0.0 - 0.5 K/uL   Basophils Relative 1 %   Basophils Absolute 0.1 0.0 - 0.1 K/uL   Immature Granulocytes 0 %   Abs Immature Granulocytes 0.02 0.00 - 0.07 K/uL    Comment: Performed at [facility], [place] [address], [place], [place] [address]  Type and screen [place]     Status: None   Collection Time: 11/11/19 11:12 AM  Result Value Ref Range   ABO/RH(D) O POS    Antibody Screen POS     Sample Expiration 11/14/2019,2359    Antibody Identification WARM AUTOANTIBODY    DAT, IgG POS    Antibody ID,T [place]      WARM AUTOANTIBODY Performed at [facility], [place] [address] [place], [place] [address]   POC occult blood, ED RN will collect     Status: Abnormal   Collection Time: 11/11/19 11:38 AM  Result Value Ref Range   Fecal Occult Bld POSITIVE (A) NEGATIVE  Respiratory Panel by RT PCR (Flu A&B, Covid) - Nasopharyngeal Swab     Status: None   Collection Time: 11/11/19  1:45 PM   Specimen: Nasopharyngeal Swab  Result Value Ref Range   SARS Coronavirus 2 by RT PCR NEGATIVE NEGATIVE    Comment: (NOTE) SARS-CoV-2 target nucleic acids are NOT DETECTED. The SARS-CoV-2 RNA is generally detectable in upper respiratoy specimens during the acute phase of infection. The lowest concentration of SARS-CoV-2 viral copies this assay can detect is 131 copies/mL. A negative result does not preclude SARS-Cov-2 infection and should not be used as the sole basis for treatment or other patient management decisions. A negative result may occur with  improper specimen collection/handling, submission of specimen other than nasopharyngeal swab, presence of viral mutation(s) within the areas targeted by this assay, and inadequate number of viral copies (<131 copies/mL). A negative result must be combined with clinical observations, patient history, and epidemiological information. The expected result is Negative. Fact Sheet for Patients:  PinkCheek.be Fact Sheet for Healthcare Providers:  GravelBags.it This test is not yet ap proved or cleared by the [place] FDA and  has been authorized for detection and/or diagnosis of SARS-CoV-2 by FDA under an Emergency Use Authorization (EUA). This EUA will remain  in effect (meaning this test can be used) for the duration of the COVID-19 declaration under Section 564(b)(1)  of the Act, 21 U.S.C. section 360bbb-3(b)(1), unless the authorization is terminated or revoked sooner.    Influenza A by PCR NEGATIVE NEGATIVE   Influenza B by PCR NEGATIVE NEGATIVE    Comment: (NOTE) The Xpert Xpress SARS-CoV-2/FLU/RSV assay is intended as an aid in  the diagnosis of influenza from Nasopharyngeal swab specimens and  should not be used as a sole basis for treatment. Nasal washings and  aspirates  are unacceptable for Xpert Xpress SARS-CoV-2/FLU/RSV  testing. Fact Sheet for Patients: PinkCheek.be Fact Sheet for Healthcare Providers: GravelBags.it This test is not yet approved or cleared by the [place] FDA and  has been authorized for detection and/or diagnosis of SARS-CoV-2 by  FDA under an Emergency Use Authorization (EUA). This EUA will remain  in effect (meaning this test can be used) for the duration of the  Covid-19 declaration under Section 564(b)(1) of the Act, 21  U.S.C. section 360bbb-3(b)(1), unless the authorization is  terminated or revoked. Performed at [facility], [place] [address] [place], [place] [address]   MRSA PCR Screening     Status: None   Collection Time: 11/11/19  7:43 PM   Specimen: Nasal Mucosa; Nasopharyngeal  Result Value Ref Range   MRSA by PCR NEGATIVE NEGATIVE    Comment:        The GeneXpert MRSA Assay (FDA approved for NASAL specimens only), is one component of a comprehensive MRSA colonization surveillance program. It is not intended to diagnose MRSA infection nor to guide or monitor treatment for MRSA infections. Performed at [facility], [place] [address] [place], [place] [id number]   Basic metabolic panel     Status: Abnormal   Collection Time: 11/12/19  5:19 AM  Result Value Ref Range   Sodium 141 135 - 145 mmol/L   Potassium 3.9 3.5 - 5.1 mmol/L   Chloride 105 98 - 111 mmol/L   CO2 29 22 - 32 mmol/L   Glucose, Bld  89 70 - 99 mg/dL   BUN 26 (H) 8 - 23 mg/dL   Creatinine, Ser 1.27 (H) 0.61 - 1.24 mg/dL   Calcium 9.1 8.9 - 10.3 mg/dL   GFR calc non Af Amer 50 (L) >60 mL/min   GFR calc Af Amer 58 (L) >60 mL/min   Anion gap 7 5 - 15    Comment: Performed at [place] [address] [place], [place] [address]  CBC     Status: Abnormal   Collection Time: 11/12/19  5:19 AM  Result Value Ref Range   WBC 6.6 4.0 - 10.5 K/uL   RBC 2.85 (L) 4.22 - 5.81 MIL/uL   Hemoglobin 7.5 (L) 13.0 - 17.0 g/dL   HCT 25.1 (L) 39.0 - 52.0 %   MCV 88.1 80.0 - 100.0 fL   MCH 26.3 26.0 - 34.0 pg   MCHC 29.9 (L) 30.0 - 36.0 g/dL   RDW 14.8 11.5 - 15.5 %   Platelets 164 150 - 400 K/uL   nRBC 0.0 0.0 - 0.2 %    Comment: Performed at [facility], [place] [address] [place], [place] [address]   DG Chest Port 1 View  Result Date: 11/11/2019 CLINICAL DATA:  Coffee ground emesis. EXAM: PORTABLE CHEST 1 VIEW COMPARISON:  08/04/2009 FINDINGS: 11:15 a.m. The lungs are clear without focal pneumonia, edema, pneumothorax or pleural effusion. Interstitial markings are diffusely coarsened with chronic features. Cardiopericardial silhouette is at upper limits of normal for size. Bones are diffusely demineralized. Telemetry leads overlie the chest. IMPRESSION: No active disease. Electronically Signed   By: Misty Stanley M.D.   On: 11/11/2019 11:37   Review of Systems Blood pressure 133/74, pulse 63, temperature 98.4 F (36.9 C), temperature source Oral, resp. rate 17, height 5\' 9"  (1.753 m), weight 76.2 kg, SpO2 100 %. Physical Exam  Constitutional: He is oriented to person, place, and time. He appears well-developed and well-nourished.  HENT:  Head: Normocephalic and atraumatic.  Eyes: Pupils are equal, round, and reactive to light. Conjunctivae and EOM are normal.  Cardiovascular: Normal rate and regular rhythm.  Respiratory: Effort normal and breath sounds normal.  GI: Soft. Bowel sounds are  normal.  Musculoskeletal:     Cervical back: Normal range of motion and neck supple.  Neurological: He is alert and oriented to person, place, and time.  Skin: Skin is warm and dry.  Psychiatric: He has a normal mood and affect. His behavior is normal. Judgment and thought content normal.   Assessment/Plan: 1) Coffee-ground emesis with melena and acute on chronic anemia-an EGD is planned for tomorrow. Further recommendation made thereafter. 2) Hypothyroidism. 3) Hypertension. 4) Alzheimer's dementia. 5) Carotid artery stenosis. Juanita Craver 11/12/2019, 11:54 AM

## 2019-11-13 ENCOUNTER — Inpatient Hospital Stay (HOSPITAL_COMMUNITY): Payer: Medicare Other | Admitting: Anesthesiology

## 2019-11-13 ENCOUNTER — Encounter (HOSPITAL_COMMUNITY)
Admission: EM | Disposition: A | Discharge status: Skilled Nursing Facility | Payer: Self-pay | Source: Skilled Nursing Facility | Attending: Family Medicine

## 2019-11-13 ENCOUNTER — Encounter (HOSPITAL_COMMUNITY): Payer: Self-pay | Admitting: Family Medicine

## 2019-11-13 DIAGNOSIS — K861 Other chronic pancreatitis: Secondary | ICD-10-CM

## 2019-11-13 DIAGNOSIS — N1831 Chronic kidney disease, stage 3a: Secondary | ICD-10-CM

## 2019-11-13 DIAGNOSIS — N183 Chronic kidney disease, stage 3 unspecified: Secondary | ICD-10-CM

## 2019-11-13 DIAGNOSIS — E039 Hypothyroidism, unspecified: Secondary | ICD-10-CM

## 2019-11-13 HISTORY — PX: ESOPHAGOGASTRODUODENOSCOPY: SHX5428

## 2019-11-13 LAB — CBC
HCT: 27.1 % — ABNORMAL LOW (ref 39.0–52.0)
Hemoglobin: 8.1 g/dL — ABNORMAL LOW (ref 13.0–17.0)
MCH: 26.3 pg (ref 26.0–34.0)
MCHC: 29.9 g/dL — ABNORMAL LOW (ref 30.0–36.0)
MCV: 88 fL (ref 80.0–100.0)
Platelets: 190 10*3/uL (ref 150–400)
RBC: 3.08 MIL/uL — ABNORMAL LOW (ref 4.22–5.81)
RDW: 14.7 % (ref 11.5–15.5)
WBC: 11.5 10*3/uL — ABNORMAL HIGH (ref 4.0–10.5)
nRBC: 0 % (ref 0.0–0.2)

## 2019-11-13 LAB — BASIC METABOLIC PANEL
Anion gap: 7 (ref 5–15)
BUN: 23 mg/dL (ref 8–23)
CO2: 27 mmol/L (ref 22–32)
Calcium: 9.3 mg/dL (ref 8.9–10.3)
Chloride: 106 mmol/L (ref 98–111)
Creatinine, Ser: 1.34 mg/dL — ABNORMAL HIGH (ref 0.61–1.24)
GFR calc Af Amer: 54 mL/min — ABNORMAL LOW (ref >60)
GFR calc non Af Amer: 47 mL/min — ABNORMAL LOW (ref >60)
Glucose, Bld: 94 mg/dL (ref 70–99)
Potassium: 3.5 mmol/L (ref 3.5–5.1)
Sodium: 140 mmol/L (ref 135–145)

## 2019-11-13 SURGERY — EGD (ESOPHAGOGASTRODUODENOSCOPY)
Anesthesia: Monitor Anesthesia Care | Laterality: Left

## 2019-11-13 MED ORDER — QUETIAPINE FUMARATE 25 MG PO TABS
12.5000 mg | ORAL_TABLET | Freq: Every day | ORAL | Status: DC
  Administered 2019-11-13: 12.5 mg via ORAL
  Filled 2019-11-13: qty 1

## 2019-11-13 MED ORDER — LACTATED RINGERS IV SOLN
INTRAVENOUS | Status: AC | PRN
  Administered 2019-11-13: 1000 mL via INTRAVENOUS

## 2019-11-13 MED ORDER — PROPOFOL 500 MG/50ML IV EMUL
INTRAVENOUS | Status: DC | PRN
  Administered 2019-11-13: 75 ug/kg/min via INTRAVENOUS

## 2019-11-13 MED ORDER — PROPOFOL 10 MG/ML IV BOLUS
INTRAVENOUS | Status: AC
  Filled 2019-11-13: qty 20

## 2019-11-13 MED ORDER — PROPOFOL 10 MG/ML IV BOLUS
INTRAVENOUS | Status: DC | PRN
  Administered 2019-11-13: 50 mg via INTRAVENOUS

## 2019-11-13 NOTE — Transfer of Care (Signed)
Immediate Anesthesia Transfer of Care Note  Patient: Esaul Dorwart  Procedure(s) Performed: Procedure(s): ESOPHAGOGASTRODUODENOSCOPY (EGD) (Left)  Patient Location: PACU  Anesthesia Type:MAC  Level of Consciousness: Patient easily awoken, sedated, comfortable, cooperative, following commands, responds to stimulation.   Airway & Oxygen Therapy: Patient spontaneously breathing, ventilating well, oxygen via simple oxygen mask.  Post-op Assessment: Report given to PACU RN, vital signs reviewed and stable, moving all extremities.   Post vital signs: Reviewed and stable.  Complications: No apparent anesthesia complications  Last Vitals:  Vitals Value Taken Time  BP 123/43 11/13/19 1601  Temp 37.4 C 11/13/19 1600  Pulse 71 11/13/19 1609  Resp 16 11/13/19 1609  SpO2 99 % 11/13/19 1609  Vitals shown include unvalidated device data.  Last Pain:  Vitals:   11/13/19 1600  TempSrc: Temporal  PainSc:          Complications: No apparent anesthesia complications

## 2019-11-13 NOTE — Anesthesia Procedure Notes (Signed)
Procedure Name: MAC Date/Time: 11/13/2019 3:41 PM Performed by: Deliah Boston, CRNA Pre-anesthesia Checklist: Patient identified, Emergency Drugs available, Suction available and Patient being monitored Patient Re-evaluated:Patient Re-evaluated prior to induction Oxygen Delivery Method: Simple face mask Preoxygenation: Pre-oxygenation with 100% oxygen Placement Confirmation: positive ETCO2

## 2019-11-13 NOTE — Interval H&P Note (Signed)
History and Physical Interval Note:  11/13/2019 3:32 PM  Christian Bennett  has presented today for surgery, with the diagnosis of melena and anemia.  The various methods of treatment have been discussed with the patient and family. After consideration of risks, benefits and other options for treatment, the patient has consented to  Procedure(s): ESOPHAGOGASTRODUODENOSCOPY (EGD) (Left) as a surgical intervention.  The patient's history has been reviewed, patient examined, no change in status, stable for surgery.  I have reviewed the patient's chart and labs.  Questions were answered to the patient's satisfaction.     Tyrez Berrios D

## 2019-11-13 NOTE — Progress Notes (Signed)
PROGRESS NOTE    Christian Bennett  [id number] DOB: [birth year] DOA: 11/11/2019 PCP: Mellody Dance, DO   Brief Narrative: Christian Bennett is a 86 y.o. male with PMH significant for Alzheimer's dementia, HTN, TIA, carotid artery occlusion s/p rt CEA, hypothyroidism, GERD. Patient presented secondary to coffee ground emesis concerning for upper GI bleeding. Associated positive FOBT and acute anemia. GI consulted.   Assessment & Plan:   Active Problems:   GI bleeding   Upper GI bleed   Coffee-ground emesis GI bleeding Upper GI bleed 2 episodes at patient's skilled nursing facility.  Per daughter, patient has a history of heavy alcohol use.  It is only recently that he is decreased his alcohol intake secondary to worsening dementia.  Hemoglobin has trended down from yesterday and is currently 7.5.  Patient appears to have acute on chronic anemia.  Anemia appears to have started 4 months ago.  Patient follows with Dr. Benson Norway as an outpatient.  No recurrent hematemesis and no hematochezia or melena noted during hospitalization. FOBT positive. Repeat hemoglobin stable. -Gastroenterology recommendations: Plan for endoscopy on 2/9  Acute on chronic anemia Acute blood loss anemia As mentioned above  CKD stage IIIa Stable.  Alzheimer's dementia Patient gets agitated during the evening and sundowns. -Continue Depakote and Zoloft  Hypothyroidism -Continue thyroid tablet 60 mg daily  Essential hypertension Patient is on amlodipine as an outpatient -Continue amlodipine  Chronic pancreatitis -Continue Creon  History of TIA -Continue Lipitor  Carotid artery stenosis S/p right CEA. On aspirin as an outpatient -Hold aspirin secondary to GI bleeding.   DVT prophylaxis: SCDs Code Status:   Code Status: DNR Family Communication: None at bedside Disposition Plan: Discharge per GI recommendations   Consultants:   Gastroenterology  Procedures:   None  Antimicrobials:  None     Subjective: No issues per patient  Objective: Vitals:   11/12/19 1334 11/12/19 1336 11/12/19 1955 11/13/19 0459  BP: 122/67  121/82 135/67  Pulse: 60  72 81  Resp: 18  17 17   Temp:  98 F (36.7 C) 98.2 F (36.8 C) 98.2 F (36.8 C)  TempSrc:  Oral Oral Oral  SpO2: 98%  100% 96%  Weight:      Height:        Intake/Output Summary (Last 24 hours) at 11/13/2019 0824 Last data filed at 11/12/2019 1300 Gross per 24 hour  Intake 240 ml  Output --  Net 240 ml   Filed Weights   11/11/19 1555  Weight: 76.2 kg    Examination:  General exam: Appears calm and comfortable Respiratory system: Clear to auscultation. Respiratory effort normal. Cardiovascular system: S1 & S2 heard, RRR. No murmurs, rubs, gallops or clicks. Gastrointestinal system: Abdomen is nondistended, soft and nontender. No organomegaly or masses felt. Normal bowel sounds heard. Central nervous system: Alert. No focal neurological deficits. Extremities: No edema. No calf tenderness Skin: No cyanosis. No rashes Psychiatry: Mood & affect appropriate.     Data Reviewed: I have personally reviewed following labs and imaging studies  CBC: Recent Labs  Lab 11/11/19 1112 11/12/19 0519 11/13/19 0546  WBC 9.1 6.6 11.5*  NEUTROABS 7.1  --   --   HGB 8.9* 7.5* 8.1*  HCT 29.9* 25.1* 27.1*  MCV 88.2 88.1 88.0  PLT 200 164 [id number]   Basic Metabolic Panel: Recent Labs  Lab 11/11/19 1112 11/12/19 0519 11/13/19 0546  NA 144 141 140  K 4.2 3.9 3.5  CL 105 105 106  CO2 30 29 27   GLUCOSE  103* 89 94  BUN 37* 26* 23  CREATININE 1.30* 1.27* 1.34*  CALCIUM 9.5 9.1 9.3   GFR: Estimated Creatinine Clearance: 37.4 mL/min (A) (by C-G formula based on SCr of 1.34 mg/dL (H)). Liver Function Tests: Recent Labs  Lab 11/11/19 1112  AST 24  ALT 25  ALKPHOS 69  BILITOT 0.5  PROT 7.4  ALBUMIN 4.1   Recent Labs  Lab 11/11/19 1112  LIPASE 26   No results for input(s): AMMONIA in the last 168 hours. Coagulation  Profile: No results for input(s): INR, PROTIME in the last 168 hours. Cardiac Enzymes: No results for input(s): CKTOTAL, CKMB, CKMBINDEX, TROPONINI in the last 168 hours. BNP (last 3 results) No results for input(s): PROBNP in the last 8760 hours. HbA1C: No results for input(s): HGBA1C in the last 72 hours. CBG: No results for input(s): GLUCAP in the last 168 hours. Lipid Profile: No results for input(s): CHOL, HDL, LDLCALC, TRIG, CHOLHDL, LDLDIRECT in the last 72 hours. Thyroid Function Tests: No results for input(s): TSH, T4TOTAL, FREET4, T3FREE, THYROIDAB in the last 72 hours. Anemia Panel: No results for input(s): VITAMINB12, FOLATE, FERRITIN, TIBC, IRON, RETICCTPCT in the last 72 hours. Sepsis Labs: No results for input(s): PROCALCITON, LATICACIDVEN in the last 168 hours.  Recent Results (from the past 240 hour(s))  Respiratory Panel by RT PCR (Flu A&B, Covid) - Nasopharyngeal Swab     Status: None   Collection Time: 11/11/19  1:45 PM   Specimen: Nasopharyngeal Swab  Result Value Ref Range Status   SARS Coronavirus 2 by RT PCR NEGATIVE NEGATIVE Final    Comment: (NOTE) SARS-CoV-2 target nucleic acids are NOT DETECTED. The SARS-CoV-2 RNA is generally detectable in upper respiratoy specimens during the acute phase of infection. The lowest concentration of SARS-CoV-2 viral copies this assay can detect is 131 copies/mL. A negative result does not preclude SARS-Cov-2 infection and should not be used as the sole basis for treatment or other patient management decisions. A negative result may occur with  improper specimen collection/handling, submission of specimen other than nasopharyngeal swab, presence of viral mutation(s) within the areas targeted by this assay, and inadequate number of viral copies (<131 copies/mL). A negative result must be combined with clinical observations, patient history, and epidemiological information. The expected result is Negative. Fact Sheet for  Patients:  PinkCheek.be Fact Sheet for Healthcare Providers:  GravelBags.it This test is not yet ap proved or cleared by the [place] FDA and  has been authorized for detection and/or diagnosis of SARS-CoV-2 by FDA under an Emergency Use Authorization (EUA). This EUA will remain  in effect (meaning this test can be used) for the duration of the COVID-19 declaration under Section 564(b)(1) of the Act, 21 U.S.C. section 360bbb-3(b)(1), unless the authorization is terminated or revoked sooner.    Influenza A by PCR NEGATIVE NEGATIVE Final   Influenza B by PCR NEGATIVE NEGATIVE Final    Comment: (NOTE) The Xpert Xpress SARS-CoV-2/FLU/RSV assay is intended as an aid in  the diagnosis of influenza from Nasopharyngeal swab specimens and  should not be used as a sole basis for treatment. Nasal washings and  aspirates are unacceptable for Xpert Xpress SARS-CoV-2/FLU/RSV  testing. Fact Sheet for Patients: PinkCheek.be Fact Sheet for Healthcare Providers: GravelBags.it This test is not yet approved or cleared by the [place] FDA and  has been authorized for detection and/or diagnosis of SARS-CoV-2 by  FDA under an Emergency Use Authorization (EUA). This EUA will remain  in effect (meaning this  test can be used) for the duration of the  Covid-19 declaration under Section 564(b)(1) of the Act, 21  U.S.C. section 360bbb-3(b)(1), unless the authorization is  terminated or revoked. Performed at [facility], [place] [address] [place], [place] [address]   MRSA PCR Screening     Status: None   Collection Time: 11/11/19  7:43 PM   Specimen: Nasal Mucosa; Nasopharyngeal  Result Value Ref Range Status   MRSA by PCR NEGATIVE NEGATIVE Final    Comment:        The GeneXpert MRSA Assay (FDA approved for NASAL specimens only), is one component of  a comprehensive MRSA colonization surveillance program. It is not intended to diagnose MRSA infection nor to guide or monitor treatment for MRSA infections. Performed at [facility], [place] [address], [place], [place] [address]          Radiology Studies: [facility] Chest Port 1 View  Result Date: 11/11/2019 CLINICAL DATA:  Coffee ground emesis. EXAM: PORTABLE CHEST 1 VIEW COMPARISON:  08/04/2009 FINDINGS: 11:15 a.m. The lungs are clear without focal pneumonia, edema, pneumothorax or pleural effusion. Interstitial markings are diffusely coarsened with chronic features. Cardiopericardial silhouette is at upper limits of normal for size. Bones are diffusely demineralized. Telemetry leads overlie the chest. IMPRESSION: No active disease. Electronically Signed   By: Misty Stanley M.D.   On: 11/11/2019 11:37        Scheduled Meds: . amLODipine  5 mg Oral Daily  . atorvastatin  40 mg Oral q1800  . divalproex  125 mg Oral Daily  . lipase/protease/amylase  36,000 Units Oral TID WC  . magnesium oxide  400 mg Oral Daily  . pantoprazole  40 mg Oral Daily  . senna  1 tablet Oral BID  . sertraline  25 mg Oral Daily  . sodium chloride flush  3 mL Intravenous Q12H  . thyroid  60 mg Oral QAC breakfast   Continuous Infusions:   LOS: 1 day     Cordelia Poche, MD [facility] 11/13/2019, 8:24 AM  If 7PM-7AM, please contact night-coverage [email or web address]

## 2019-11-13 NOTE — Progress Notes (Signed)
Pt continued attempts to get out of bed but is easily redirected. Pt found to have skin tear on right medial knee and reddened area on right upper lateral thigh from throwing legs over bed. Mepilex foams applied to areas to protect.

## 2019-11-13 NOTE — Op Note (Signed)
[facility] Patient Name: Christian Bennett Procedure Date: 11/13/2019 MRN: [id number] Attending MD: Carol Ada , MD Date of Birth: May 31, 1928 CSN: [id number] Age: 86 Admit Type: Outpatient Procedure:                Upper GI endoscopy Indications:              Heme positive stool, Melena Providers:                Carol Ada, MD, Josie Dixon, RN, Corie Chiquito,                            Technician, Heide Scales, CRNA Referring MD:              Medicines:                Propofol per Anesthesia Complications:            No immediate complications. Estimated Blood Loss:     Estimated blood loss: none. Procedure:                Pre-Anesthesia Assessment:                           - Prior to the procedure, a History and Physical                            was performed, and patient medications and                            allergies were reviewed. The patient's tolerance of                            previous anesthesia was also reviewed. The risks                            and benefits of the procedure and the sedation                            options and risks were discussed with the patient.                            All questions were answered, and informed consent                            was obtained. Prior Anticoagulants: The patient has                            taken no previous anticoagulant or antiplatelet                            agents. ASA Grade Assessment: III - A patient with                            severe systemic disease. After reviewing the risks  and benefits, the patient was deemed in                            satisfactory condition to undergo the procedure.                           - Sedation was administered by an anesthesia                            professional. Deep sedation was attained.                           After obtaining informed consent, the endoscope was                            passed  under direct vision. Throughout the                            procedure, the patient's blood pressure, pulse, and                            oxygen saturations were monitored continuously. The                            [id number]-[id number]) Olympus gastroscope was                            introduced through the mouth, and advanced to the                            second part of duodenum. The upper GI endoscopy was                            accomplished without difficulty. The patient                            tolerated the procedure well. Scope In: Scope Out: Findings:      LA Grade D (one or more mucosal breaks involving at least 75% of       esophageal circumference) esophagitis with no bleeding was found in the       lower third of the esophagus.      A 6 cm hiatal hernia was present.      The stomach was normal.      The examined duodenum was normal.      There was some friability at the GE junction. Impression:               - LA Grade D reflux esophagitis with no bleeding.                           - Normal stomach.                           - Normal examined duodenum.                           -  No specimens collected. Moderate Sedation:      Not Applicable - Patient had care per Anesthesia. Recommendation:           - Return patient to hospital ward for ongoing care.                           - Resume regular diet.                           - Continue present medications.                           - PPI QD indefinitely.                           - If HGB stable he can be discharged and follow up                            in the office in 2 weeks. Procedure Code(s):        --- Professional ---                           [phone], Esophagogastroduodenoscopy, flexible,                            transoral; diagnostic, including collection of                            specimen(s) by brushing or washing, when performed                            (separate procedure) Diagnosis  Code(s):        --- Professional ---                           K21.00, Gastro-esophageal reflux disease with                            esophagitis, without bleeding                           R19.5, Other fecal abnormalities                           K92.1, Melena (includes Hematochezia) CPT copyright 2019 American Medical Association. All rights reserved. The codes documented in this report are preliminary and upon coder review may  be revised to meet current compliance requirements. Carol Ada, MD Carol Ada, MD 11/13/2019 4:01:10 PM This report has been signed electronically. Number of Addenda: 0

## 2019-11-13 NOTE — Anesthesia Preprocedure Evaluation (Addendum)
Anesthesia Evaluation  Patient identified by MRN, date of birth, ID band Patient awake    Reviewed: Allergy & Precautions, NPO status , Patient's Chart, lab work & pertinent test results  Airway Mallampati: II  TM Distance: >3 FB Neck ROM: Full    Dental no notable dental hx.    Pulmonary neg pulmonary ROS, former smoker,    Pulmonary exam normal breath sounds clear to auscultation       Cardiovascular hypertension, Normal cardiovascular exam Rhythm:Regular Rate:Normal  Left Ventricle: Left ventricular ejection fraction, by visual estimation,  is 60 to 65%. The left ventricle has normal function. Left ventricular  septal wall thickness was mildly increased. There is mildly increased left  ventricular hypertrophy. Normal  left ventricular size.   Right Ventricle: The right ventricular size is normal. No increase in  right ventricular wall thickness. Global RV systolic function is has  normal systolic function. The tricuspid regurgitant velocity is 1.57 m/s,  and with an assumed right atrial pressure  of 3 mmHg, the estimated right ventricular systolic pressure is normal at  12.9 mmHg.    Neuro/Psych Dementia CVA    GI/Hepatic Neg liver ROS, GERD  ,  Endo/Other  Hypothyroidism   Renal/GU negative Renal ROS  negative genitourinary   Musculoskeletal negative musculoskeletal ROS (+)   Abdominal   Peds negative pediatric ROS (+)  Hematology  (+) anemia ,   Anesthesia Other Findings   Reproductive/Obstetrics negative OB ROS                            Anesthesia Physical Anesthesia Plan  ASA: III  Anesthesia Plan: MAC   Post-op Pain Management:    Induction: Intravenous  PONV Risk Score and Plan: 0  Airway Management Planned: Simple Face Mask  Additional Equipment:   Intra-op Plan:   Post-operative Plan:   Informed Consent: I have reviewed the patients History and Physical,  chart, labs and discussed the procedure including the risks, benefits and alternatives for the proposed anesthesia with the patient or authorized representative who has indicated his/her understanding and acceptance.     Dental advisory given  Plan Discussed with: CRNA and Surgeon  Anesthesia Plan Comments:         Anesthesia Quick Evaluation

## 2019-11-13 NOTE — Anesthesia Postprocedure Evaluation (Signed)
Anesthesia Post Note  Patient: Christian Bennett  Procedure(s) Performed: ESOPHAGOGASTRODUODENOSCOPY (EGD) (Left )     Patient location during evaluation: PACU Anesthesia Type: MAC Level of consciousness: awake and alert Pain management: pain level controlled Vital Signs Assessment: post-procedure vital signs reviewed and stable Respiratory status: spontaneous breathing, nonlabored ventilation, respiratory function stable and patient connected to nasal cannula oxygen Cardiovascular status: stable and blood pressure returned to baseline Postop Assessment: no apparent nausea or vomiting Anesthetic complications: no    Last Vitals:  Vitals:   11/13/19 1600 11/13/19 1610  BP: (!) 123/43 (!) 130/59  Pulse: 69 70  Resp: 16 17  Temp: 37.4 C   SpO2: 100% 100%    Last Pain:  Vitals:   11/13/19 1610  TempSrc:   PainSc: 0-No pain                 Gioia Ranes S

## 2019-11-13 NOTE — TOC Initial Note (Signed)
Transition of Care [facility]) - Initial/Assessment Note    Patient Details  Name: Angad Roesel MRN: [id number] Date of Birth: 11-Jun-1928  [facility]) CM/SW Contact:    Trish Mage, LCSW Phone Number: 11/13/2019, 10:14 AM  Clinical Narrative:  Patient is resident of [facility].  Will need FL2 upon return FAXed to [phone] during the week; different # on weekend.  Will require COVID negative test within 72 hours of return. TOC will continue to follow during the course of hospitalization.                  Expected Discharge Plan: Memory Care Barriers to Discharge: No Barriers Identified   Patient Goals and CMS Choice        Expected Discharge Plan and Services Expected Discharge Plan: Memory Care   Discharge Planning Services: CM Consult   Living arrangements for the past 2 months: Assisted Living Facility                                      Prior Living Arrangements/Services Living arrangements for the past 2 months: [place] Lives with:: Facility Resident Patient language and need for interpreter reviewed:: Yes Do you feel safe going back to the place where you live?: Yes      Need for Family Participation in Patient Care: Yes (Comment) Care giver support system in place?: Yes (comment)   Criminal Activity/Legal Involvement Pertinent to Current Situation/Hospitalization: No - Comment as needed  Activities of Daily Living Home Assistive Devices/Equipment: None ADL Screening (condition at time of admission) Patient's cognitive ability adequate to safely complete daily activities?: No Is the patient deaf or have difficulty hearing?: Yes Does the patient have difficulty seeing, even when wearing glasses/contacts?: No Does the patient have difficulty concentrating, remembering, or making decisions?: Yes Patient able to express need for assistance with ADLs?: No Does the patient have difficulty dressing or bathing?:  Yes Independently performs ADLs?: No Communication: Independent Dressing (OT): Needs assistance Is this a change from baseline?: Pre-admission baseline Grooming: Needs assistance(promp) Is this a change from baseline?: Pre-admission baseline Feeding: Needs assistance(promp) Is this a change from baseline?: Pre-admission baseline Bathing: Needs assistance Is this a change from baseline?: Pre-admission baseline Toileting: Needs assistance Is this a change from baseline?: Pre-admission baseline In/Out Bed: Needs assistance Is this a change from baseline?: Pre-admission baseline Walks in Home: Needs assistance Is this a change from baseline?: Pre-admission baseline Does the patient have difficulty walking or climbing stairs?: Yes Weakness of Legs: Left Weakness of Arms/Hands: Left  Permission Sought/Granted Permission sought to share information with : Family Supports Permission granted to share information with : Yes, Verbal Permission Granted  Share Information with NAME: Gaye Alken     Permission granted to share info w Relationship: daughter  Permission granted to share info w Contact Information: [phone]  Emotional Assessment Appearance:: Appears stated age     Orientation: : Oriented to Self Alcohol / Substance Use: Not Applicable Psych Involvement: No (comment)  Admission diagnosis:  GI bleeding [K92.2] Vomiting without nausea, intractability of vomiting not specified, unspecified vomiting type [R11.11] Upper GI bleed [K92.2] Patient Active Problem List   Diagnosis Date Noted  . CKD (chronic kidney disease), stage III 11/13/2019  . Upper GI bleed 11/12/2019  . GI bleeding 11/11/2019  . Cognitive dysfunction 10/25/2019  . History of CVA (cerebrovascular accident) 10/25/2019  . Anemia  10/25/2019  . Hyperlipidemia 09/26/2019  . Essential hypertension 09/26/2019  . TIA due to embolism ([place]) 09/26/2019  . H/O carotid endarterectomy 09/26/2019  . Carotid artery  stenosis, symptomatic, right 07/25/2019  . CVA (cerebral vascular accident) ([place]) 07/11/2019  . TIA (transient ischemic attack) 07/10/2019  . Adjustment disorder with mixed anxiety and depressed mood 06/20/2018  . Vitamin D insufficiency 06/20/2018  . History of anemia 06/20/2018  . Medically noncompliant-   does not want to go for hearing eval 06/20/2018  . Bilateral impacted cerumen 02/07/2018  . Hearing difficulty of both ears 02/03/2018  . Environmental and seasonal allergies 02/03/2018  . Eustachian tube dysfunction, bilateral 02/03/2018  . History of vitamin D deficiency 01/30/2018  . Contact dermatitis and eczema 10/19/2017  . HTN (hypertension) 06/22/2017  . GERD (gastroesophageal reflux disease) 06/22/2017  . Hx of colonoscopy 06/22/2017  . Mood disorder ([place]) 06/22/2017  . Hypothyroidism 06/22/2017  . Chronic pancreatitis ([place]) 06/22/2017   PCP:  Mellody Dance, DO Pharmacy:   [facility] ([place] [place] - [id number] [place]) [place] [address] Phone: [phone] Fax: [phone]     Social Determinants of Health (SDOH) Interventions    Readmission Risk Interventions No flowsheet data found.

## 2019-11-14 DIAGNOSIS — K2901 Acute gastritis with bleeding: Secondary | ICD-10-CM

## 2019-11-14 LAB — BASIC METABOLIC PANEL
Anion gap: 6 (ref 5–15)
BUN: 22 mg/dL (ref 8–23)
CO2: 27 mmol/L (ref 22–32)
Calcium: 9.1 mg/dL (ref 8.9–10.3)
Chloride: 105 mmol/L (ref 98–111)
Creatinine, Ser: 1.19 mg/dL (ref 0.61–1.24)
GFR calc Af Amer: >60 mL/min (ref >60)
GFR calc non Af Amer: 54 mL/min — ABNORMAL LOW (ref >60)
Glucose, Bld: 96 mg/dL (ref 70–99)
Potassium: 3.4 mmol/L — ABNORMAL LOW (ref 3.5–5.1)
Sodium: 138 mmol/L (ref 135–145)

## 2019-11-14 LAB — CBC
HCT: 26.6 % — ABNORMAL LOW (ref 39.0–52.0)
Hemoglobin: 7.8 g/dL — ABNORMAL LOW (ref 13.0–17.0)
MCH: 25.7 pg — ABNORMAL LOW (ref 26.0–34.0)
MCHC: 29.3 g/dL — ABNORMAL LOW (ref 30.0–36.0)
MCV: 87.5 fL (ref 80.0–100.0)
Platelets: 176 10*3/uL (ref 150–400)
RBC: 3.04 MIL/uL — ABNORMAL LOW (ref 4.22–5.81)
RDW: 14.6 % (ref 11.5–15.5)
WBC: 8.7 10*3/uL (ref 4.0–10.5)
nRBC: 0 % (ref 0.0–0.2)

## 2019-11-14 LAB — HEMOGLOBIN AND HEMATOCRIT, BLOOD
HCT: 29.9 % — ABNORMAL LOW (ref 39.0–52.0)
Hemoglobin: 9.3 g/dL — ABNORMAL LOW (ref 13.0–17.0)

## 2019-11-14 LAB — PREPARE RBC (CROSSMATCH)

## 2019-11-14 MED ORDER — SODIUM CHLORIDE 0.9% IV SOLUTION: Freq: Once | INTRAVENOUS | Status: AC

## 2019-11-14 MED ORDER — POLYSACCHARIDE IRON COMPLEX 150 MG PO CAPS: 150.0000 mg | ORAL_CAPSULE | Freq: Every day | ORAL | Status: DC

## 2019-11-14 MED ORDER — POTASSIUM CHLORIDE CRYS ER 20 MEQ PO TBCR
40.0000 meq | EXTENDED_RELEASE_TABLET | Freq: Once | ORAL | Status: AC
  Administered 2019-11-14: 40 meq via ORAL

## 2019-11-14 MED ORDER — QUETIAPINE FUMARATE 25 MG PO TABS: 12.5000 mg | ORAL_TABLET | Freq: Every day | ORAL | Status: DC

## 2019-11-14 MED ORDER — SENNA 8.6 MG PO TABS: 1.0000 | ORAL_TABLET | Freq: Two times a day (BID) | ORAL | 0 refills | Status: DC

## 2019-11-14 MED ORDER — PANTOPRAZOLE SODIUM 40 MG PO TBEC: 40.0000 mg | DELAYED_RELEASE_TABLET | Freq: Every day | ORAL | Status: DC

## 2019-11-14 MED ORDER — ASPIRIN 81 MG PO TBEC: 81.0000 mg | DELAYED_RELEASE_TABLET | Freq: Every day | ORAL | 12 refills | Status: DC

## 2019-11-14 MED ORDER — AMLODIPINE BESYLATE 5 MG PO TABS: 5.0000 mg | ORAL_TABLET | Freq: Every day | ORAL | Status: DC

## 2019-11-14 MED ORDER — POLYSACCHARIDE IRON COMPLEX 150 MG PO CAPS
150.0000 mg | ORAL_CAPSULE | Freq: Every day | ORAL | Status: DC
  Administered 2019-11-14: 150 mg via ORAL
  Filled 2019-11-14: qty 1

## 2019-11-14 NOTE — Progress Notes (Signed)
  PT Cancellation Note  Patient Details Name: Christian Bennett MRN: [id number] DOB: 08/05/1928   Cancelled Treatment:    Reason Eval/Treat Not Completed: Other (comment)(RN reports pt didi not rest well last night and is lethargic and confused today. He is about to recieve blood and has already been set up to return to facility this afternoon. RN recommending we hold at this time.) Will follow up at later date/time as pt is able and as schedule allows.   Verner Mould, DPT Physical Therapist with [facility] [phone]  11/14/2019 11:36 AM

## 2019-11-14 NOTE — Progress Notes (Signed)
Patient discharged to SNF via PTAR in stable condition.

## 2019-11-14 NOTE — NC FL2 (Signed)
[place] LEVEL OF CARE SCREENING TOOL     IDENTIFICATION  Patient Name: Christian Bennett Birthdate: [birth year] Sex: male Admission Date (Current Location): 11/11/2019  [facility] and [place] Number:  Herbalist and Address:  [facility],  [place] [address], [place]      Provider Number: [id number]  Attending Physician Name and Address:  Mendel Corning, MD  Relative Name and Phone Number:       Current Level of Care: Hospital Recommended Level of Care: Memory Care Prior Approval Number:    Date Approved/Denied:   PASRR Number:    Discharge Plan: Domiciliary (Rest home)([facility] Care)    Current Diagnoses: Patient Active Problem List   Diagnosis Date Noted  . CKD (chronic kidney disease), stage III 11/13/2019  . Upper GI bleed 11/12/2019  . GI bleeding 11/11/2019  . Cognitive dysfunction 10/25/2019  . History of CVA (cerebrovascular accident) 10/25/2019  . Anemia 10/25/2019  . Hyperlipidemia 09/26/2019  . Essential hypertension 09/26/2019  . TIA due to embolism ([place]) 09/26/2019  . H/O carotid endarterectomy 09/26/2019  . Carotid artery stenosis, symptomatic, right 07/25/2019  . CVA (cerebral vascular accident) ([place]) 07/11/2019  . TIA (transient ischemic attack) 07/10/2019  . Adjustment disorder with mixed anxiety and depressed mood 06/20/2018  . Vitamin D insufficiency 06/20/2018  . History of anemia 06/20/2018  . Medically noncompliant-   does not want to go for hearing eval 06/20/2018  . Bilateral impacted cerumen 02/07/2018  . Hearing difficulty of both ears 02/03/2018  . Environmental and seasonal allergies 02/03/2018  . Eustachian tube dysfunction, bilateral 02/03/2018  . History of vitamin D deficiency 01/30/2018  . Contact dermatitis and eczema 10/19/2017  . HTN (hypertension) 06/22/2017  . GERD (gastroesophageal reflux disease) 06/22/2017  . Hx of colonoscopy 06/22/2017  . Mood disorder ([place])  06/22/2017  . Hypothyroidism 06/22/2017  . Chronic pancreatitis ([place]) 06/22/2017    Orientation RESPIRATION BLADDER Height & Weight     Self  Normal External catheter Weight: 76.2 kg Height:  5\' 9"  (175.3 cm)  BEHAVIORAL SYMPTOMS/MOOD NEUROLOGICAL BOWEL NUTRITION STATUS  (none) (none) Incontinent Diet(see d/c summary)  AMBULATORY STATUS COMMUNICATION OF NEEDS Skin   Extensive Assist Verbally Normal                       Personal Care Assistance Level of Assistance  Bathing, Feeding, Dressing Bathing Assistance: Maximum assistance Feeding assistance: Limited assistance Dressing Assistance: Maximum assistance     Functional Limitations Info  Sight, Hearing, Speech Sight Info: Adequate Hearing Info: Impaired Speech Info: Adequate    SPECIAL CARE FACTORS FREQUENCY                       Contractures Contractures Info: Not present    Additional Factors Info  Code Status, Allergies Code Status Info: DNR Allergies Info: Doxycycline, Eggs           Current Medications (11/14/2019):  This is the current hospital active medication list    Discharge Medications:  amLODipine 5 MG tablet Commonly known as: NORVASC Take 1 tablet (5 mg total) by mouth daily.          aspirin 81 MG EC tablet Start taking on: November 15, 2019 Take 1 tablet (81 mg total) by mouth daily.          atorvastatin 40 MG tablet Commonly known as: LIPITOR Take 1 tablet (40 mg total) by mouth daily at  6 PM. No more rfs--> Future Refills per long term care facility pt is entering jan 25-27, 2021          Creon 36000 UNITS Cpep capsule Take 36,000 Units by mouth 3 (three) times daily with meals. Generic drug: lipase/protease/amylase          divalproex 125 MG DR tablet Commonly known as: Depakote Take 1 tablet (125 mg total) by mouth daily.          eucerin cream APPLY TWICE DAILY AS NEEDED FOR RASH OR ITCHING          fluticasone 50 MCG/ACT nasal spray Commonly known as:  FLONASE Place 2 sprays into both nostrils daily as needed for allergies or rhinitis.          iron polysaccharides 150 MG capsule Commonly known as: NIFEREX Take 1 capsule (150 mg total) by mouth daily.          magnesium oxide 400 (241.3 Mg) MG tablet Commonly known as: MAG-OX Take 1 tablet (400 mg total) by mouth daily.          pantoprazole 40 MG tablet Commonly known as: PROTONIX Take 1 tablet (40 mg total) by mouth daily. Replaces: Zegerid 20-1100 MG Caps capsule          QUEtiapine 25 MG tablet Commonly known as: SEROQUEL Take 0.5 tablets (12.5 mg total) by mouth at bedtime.          senna 8.6 MG Tabs tablet Commonly known as: SENOKOT Take 1 tablet (8.6 mg total) by mouth 2 (two) times daily.          sertraline 25 MG tablet Commonly known as: ZOLOFT Take 1 tablet (25 mg total) by mouth daily. No more rfs--> Future Refills per long term care facility pt is entering jan 25-27, 2021          thyroid 60 MG tablet Commonly known as: NP Thyroid TAKE 1 TABLET BY MOUTH ONCE DAILY BEFORE BREAKFAST   No more rfs--> Future Refills per long term care facility pt is entering jan 25-27, 2021          triamcinolone cream 0.1 % Commonly known as: KENALOG Apply 1 application topically as needed (rash).          Vitamin D (Ergocalciferol) 1.25 MG (50000 UNIT) Caps capsule Commonly known as: DRISDOL Take 1 capsule (50,000 Units total) by mouth every 7 (seven) days. No more rfs--> Future Refills per long term care facility pt is entering jan 25-27, 2021            Relevant Imaging Results:  Relevant Lab Results:   Additional [place], LCSW

## 2019-11-14 NOTE — TOC Transition Note (Signed)
Transition of Care [facility]) - CM/SW Discharge Note   Patient Details  Name: Christian Bennett MRN: [id number] Date of Birth: 1928-02-12  Transition of Care [facility]) CM/SW Contact:  Trish Mage, LCSW Phone Number: 11/14/2019, 10:31 AM   Clinical Narrative:   Patient to return to [facility] today.  PTAR arranged.  Nursing, please call report to Ms Kenney Houseman at [phone]. Thank you.  TOC sign off.    Final next level of care: Memory Care Barriers to Discharge: No Barriers Identified   Patient Goals and CMS Choice        Discharge Placement                       Discharge Plan and Services   Discharge Planning Services: CM Consult                                 Social Determinants of Health (SDOH) Interventions     Readmission Risk Interventions No flowsheet data found.

## 2019-11-14 NOTE — Discharge Summary (Signed)
Physician Discharge Summary   Patient ID: Christian Bennett MRN: [id number] DOB/AGE: [birth year] 86 y.o.  Admit date: 11/11/2019 Discharge date: 11/14/2019  Primary Care Physician:  Mellody Dance, DO   Recommendations for Outpatient Follow-up:  1. Follow up with PCP in 1-2 weeks 2. Protonix 40 mg daily indefinitely 3. Aspirin has been placed on hold, restart in am  Home Health: Patient returning to skilled nursing facility Equipment/Devices:   Discharge Condition: stable  CODE STATUS: DNR Diet recommendation: Heart healthy diet   Discharge Diagnoses:    . Acute blood loss on chronic anemia . Upper GI bleed . Chronic pancreatitis ([place]) . Hypothyroidism CKD stage IIIa Chronic anemia secondary to chronic kidney disease Alzheimer's dementia Chronic pancreatitis History of TIA History of carotid artery stenosis status post right CEA Essential hypertension  Consults: Gastroenterology    Allergies:   Allergies  Allergen Reactions  . Doxycycline Hives  . Eggs Or Egg-Derived Products     UNSPECIFIED REACTION  >> "Sick"     DISCHARGE MEDICATIONS: Allergies as of 11/14/2019      Reactions   Doxycycline Hives   Eggs Or Egg-derived Products    UNSPECIFIED REACTION  >> "Sick"      Medication List    STOP taking these medications   Zegerid 20-1100 MG Caps capsule Generic drug: Omeprazole-Sodium Bicarbonate Replaced by: pantoprazole 40 MG tablet     TAKE these medications   amLODipine 5 MG tablet Commonly known as: NORVASC Take 1 tablet (5 mg total) by mouth daily.   aspirin 81 MG EC tablet Take 1 tablet (81 mg total) by mouth daily. Start taking on: November 15, 2019   atorvastatin 40 MG tablet Commonly known as: LIPITOR Take 1 tablet (40 mg total) by mouth daily at 6 PM. No more rfs-->  Future Refills per long term care facility pt is entering jan 25-27, 2021   Creon 36000 UNITS Cpep capsule Generic drug: lipase/protease/amylase Take 36,000 Units by  mouth 3 (three) times daily with meals.   divalproex 125 MG DR tablet Commonly known as: Depakote Take 1 tablet (125 mg total) by mouth daily.   eucerin cream APPLY TWICE DAILY AS NEEDED FOR RASH OR ITCHING What changed: See the new instructions.   fluticasone 50 MCG/ACT nasal spray Commonly known as: FLONASE Place 2 sprays into both nostrils daily as needed for allergies or rhinitis.   iron polysaccharides 150 MG capsule Commonly known as: NIFEREX Take 1 capsule (150 mg total) by mouth daily.   magnesium oxide 400 (241.3 Mg) MG tablet Commonly known as: MAG-OX Take 1 tablet (400 mg total) by mouth daily.   pantoprazole 40 MG tablet Commonly known as: PROTONIX Take 1 tablet (40 mg total) by mouth daily. Replaces: Zegerid 20-1100 MG Caps capsule   QUEtiapine 25 MG tablet Commonly known as: SEROQUEL Take 0.5 tablets (12.5 mg total) by mouth at bedtime.   senna 8.6 MG Tabs tablet Commonly known as: SENOKOT Take 1 tablet (8.6 mg total) by mouth 2 (two) times daily.   sertraline 25 MG tablet Commonly known as: ZOLOFT Take 1 tablet (25 mg total) by mouth daily. No more rfs-->  Future Refills per long term care facility pt is entering jan 25-27, 2021   thyroid 60 MG tablet Commonly known as: NP Thyroid TAKE 1 TABLET BY MOUTH ONCE DAILY BEFORE BREAKFAST  No more rfs-->  Future Refills per long term care facility pt is entering jan 25-27, 2021 What changed:   how much to take  how  to take this  when to take this  additional instructions   triamcinolone cream 0.1 % Commonly known as: KENALOG Apply 1 application topically as needed (rash).   Vitamin D (Ergocalciferol) 1.25 MG (50000 UNIT) Caps capsule Commonly known as: DRISDOL Take 1 capsule (50,000 Units total) by mouth every 7 (seven) days. No more rfs-->  Future Refills per long term care facility pt is entering jan 25-27, 2021        Brief H and P: For complete details please refer to admission H and P,  but in brief Christian Bennett is a 86 y.o. malewith PMH significant for Alzheimer's dementia, HTN,TIA,carotid artery occlusion s/p rt CEA,hypothyroidism, GERD. Patient presented secondary to coffee ground emesis concerning for upper GI bleeding. Associated positive FOBT and acute anemia. GI consulted and patient admitted for further work-up.  Hospital Course:   Acute blood loss anemia, history of chronic anemia, upper GI bleed -Patient had 2 episodes of coffee-ground emesis at the skilled nursing facility.  FOBT positive.  Per daughter patient had a history of heavy alcohol use.  Recently decreased his alcohol intake secondary to worsening dementia. -Hemoglobin has been slowly trending down, on admission 7.5, baseline around 8 -GI was consulted, patient underwent endoscopy on 2/9.  Endoscopy showed grade D reflux esophagitis with no bleeding, recommended PPI daily indefinitely.  Follow-up in office in 2 weeks -Hemoglobin is 7.8 this morning, was 8.1 yesterday.  No nausea vomiting or hematemesis overnight.  Will transfuse 1 unit packed RBCs to keep it close to 8 and above before discharge.  Started on Niferex supplementation.  CKD stage IIIa -Creatinine stable, at baseline.  Creatinine 1.1 at the time of discharge.  History of Alzheimer's dementia with sundowning -Appears to get agitated during the evenings and sundown's. -Continue Depakote, Zoloft, was started on low-dose Seroquel, at bedtime  Hypothyroidism Continue thyroid tablet.  Essential hypertension Continue amlodipine  Chronic pancreatitis Continue Creon  History of TIA -Continue Lipitor, may restart aspirin tomorrow.  Aspirin was held during hospitalization due to GI bleed  Carotid artery stenosis S/p right CEA. On aspirin as an outpatient -Aspirin was held during hospitalization.  Given history of TIA, carotid artery disease, resume aspirin in a.m.  Patient's daughter was called to update the discharge planning,  left detailed message.  Day of Discharge S: Currently no acute complaints.  No fevers chills or any active bleeding overnight.  BP 133/89 (BP Location: Right Arm)   Pulse 88   Temp 97.9 F (36.6 C) (Axillary)   Resp 18   Ht 5\' 9"  (1.753 m)   Wt 76.2 kg   SpO2 94%   BMI 24.81 kg/m   Physical Exam: General: Alert and awake oriented x3 not in any acute distress. HEENT: anicteric sclera, pupils reactive to light and accommodation CVS: S1-S2 clear no murmur rubs or gallops Chest: clear to auscultation bilaterally, no wheezing rales or rhonchi Abdomen: soft nontender, nondistended, normal bowel sounds Extremities: no cyanosis, clubbing or edema noted bilaterally Neuro: No new deficits    Get Medicines reviewed and adjusted: Please take all your medications with you for your next visit with your Primary MD  Please request your Primary MD to go over all hospital tests and procedure/radiological results at the follow up. Please ask your Primary MD to get all Hospital records sent to his/her office.  If you experience worsening of your admission symptoms, develop shortness of breath, life threatening emergency, suicidal or homicidal thoughts you must seek medical attention immediately by calling 911  or calling your MD immediately  if symptoms less severe.  You must read complete instructions/literature along with all the possible adverse reactions/side effects for all the Medicines you take and that have been prescribed to you. Take any new Medicines after you have completely understood and accept all the possible adverse reactions/side effects.   Do not drive when taking pain medications.   Do not take more than prescribed Pain, Sleep and Anxiety Medications  Special Instructions: If you have smoked or chewed Tobacco  in the last 2 yrs please stop smoking, stop any regular Alcohol  and or any Recreational drug use.  Wear Seat belts while driving.  Please note  You were cared for  by a hospitalist during your hospital stay. Once you are discharged, your primary care physician will handle any further medical issues. Please note that NO REFILLS for any discharge medications will be authorized once you are discharged, as it is imperative that you return to your primary care physician (or establish a relationship with a primary care physician if you do not have one) for your aftercare needs so that they can reassess your need for medications and monitor your lab values.   The results of significant diagnostics from this hospitalization (including imaging, microbiology, ancillary and laboratory) are listed below for reference.      Procedures/Studies:  DG Chest Port 1 View  Result Date: 11/11/2019 CLINICAL DATA:  Coffee ground emesis. EXAM: PORTABLE CHEST 1 VIEW COMPARISON:  08/04/2009 FINDINGS: 11:15 a.m. The lungs are clear without focal pneumonia, edema, pneumothorax or pleural effusion. Interstitial markings are diffusely coarsened with chronic features. Cardiopericardial silhouette is at upper limits of normal for size. Bones are diffusely demineralized. Telemetry leads overlie the chest. IMPRESSION: No active disease. Electronically Signed   By: Misty Stanley M.D.   On: 11/11/2019 11:37   EEG adult        [facility] [address] [place] [address] [phone]      Electroencephalogram Procedure Note Mr. Sherry Ruffing Date of Birth:  [birth year] Medical Record Number:  [id number] Indications: Diagnostic Date of Procedure: 10/15/19 Medications: none Clinical history :  89 year patient with dementia and memoryloss Technical Description This study was performed using 17 channel digital electroencephalographic recording equipment. [employer] 10-20 electrode placement was used. The record was obtained with the patient awake, drowsy and asleep.  The record is of fair technical quality for purposes of interpretation. Activation Procedures:   hyperventilation and photic stimulation . EEG Description Awake: Alpha Activity: The waking state record contains a well-defined bi-occipital alpha rhythm of  low amplitude with a dominant frequency of 8 Hz. Reactivity is uncertain. No paroxsymal activity, spikes, or sharp waves are noted.Intermittent 5-6 hertz generalized slowing is noted Technical component of study is adequate. EKG tracing shows regular sinus rhytmn Length of this recording is  30 min and 31 secs Sleep: With drowsiness, there is attenuation of the background alpha activity. As the patient enters into light sleep, vertex waves and symmetrical spindles are noted. K complexes are noted in sleep. Transition to the waking state is unremarkable. Result of Activation Procedures: Hyperventilation: Hyperventilation for three minutes fails to activate the recording. Photo Stimulation: Photic stimulation failed to activated the recording. Summary This is a mildly abnormal EEG due to the presence of mild bihemipheric slowing which is a nonspecific finding seen in a variety of degenerative, toxic metabolic etiologies. No definite epileptiform activity is seen.      LAB RESULTS: Basic Metabolic Panel:  Recent Labs  Lab 11/13/19 0546 11/14/19 0504  NA 140 138  K 3.5 3.4*  CL 106 105  CO2 27 27  GLUCOSE 94 96  BUN 23 22  CREATININE 1.34* 1.19  CALCIUM 9.3 9.1   Liver Function Tests: Recent Labs  Lab 11/11/19 1112  AST 24  ALT 25  ALKPHOS 69  BILITOT 0.5  PROT 7.4  ALBUMIN 4.1   Recent Labs  Lab 11/11/19 1112  LIPASE 26   No results for input(s): AMMONIA in the last 168 hours. CBC: Recent Labs  Lab 11/11/19 1112 11/12/19 0519 11/13/19 0546 11/13/19 0546 11/14/19 0504  WBC 9.1   < > 11.5*  --  8.7  NEUTROABS 7.1  --   --   --   --   HGB 8.9*   < > 8.1*  --  7.8*  HCT 29.9*   < > 27.1*  --  26.6*  MCV 88.2   < > 88.0   < > 87.5  PLT 200   < > 190  --  176   < > = values in this interval not displayed.   Cardiac  Enzymes: No results for input(s): CKTOTAL, CKMB, CKMBINDEX, TROPONINI in the last 168 hours. BNP: Invalid input(s): POCBNP CBG: No results for input(s): GLUCAP in the last 168 hours.     Disposition and Follow-up: Discharge Instructions    Diet - low sodium heart healthy   Complete by: As directed    Increase activity slowly   Complete by: As directed        DISPOSITION: [facility]    Carol Ada, MD. Schedule an appointment as soon as possible for a visit in 2 week(s).   Specialty: Gastroenterology Contact information: [address] [place] [address] [phone]            Time coordinating discharge:  35 minutes  Signed:   Estill Cotta M.D. [facility] 11/14/2019, 10:18 AM

## 2019-11-15 ENCOUNTER — Encounter: Payer: Self-pay | Admitting: *Deleted

## 2019-11-15 DIAGNOSIS — Z23 Encounter for immunization: Secondary | ICD-10-CM | POA: Diagnosis not present

## 2019-11-15 LAB — BPAM RBC
Blood Product Expiration Date: 202103152359
Blood Product Expiration Date: 202103152359
ISSUE DATE / TIME: 202102101128
Unit Type and Rh: 5100
Unit Type and Rh: 5100

## 2019-11-15 LAB — TYPE AND SCREEN
ABO/RH(D): O POS
Antibody Screen: POSITIVE
DAT, IgG: POSITIVE
Unit division: 0
Unit division: 0

## 2019-11-16 ENCOUNTER — Emergency Department (HOSPITAL_COMMUNITY): Payer: Medicare Other

## 2019-11-16 ENCOUNTER — Other Ambulatory Visit: Payer: Self-pay

## 2019-11-16 ENCOUNTER — Encounter (HOSPITAL_COMMUNITY): Payer: Self-pay

## 2019-11-16 ENCOUNTER — Inpatient Hospital Stay (HOSPITAL_COMMUNITY)
Admission: EM | Admit: 2019-11-16 | Discharge: 2019-11-19 | DRG: 369 | Disposition: A | Discharge status: Nursing Home (Includes ICF) | Payer: Medicare Other | Attending: Family Medicine | Admitting: Family Medicine

## 2019-11-16 DIAGNOSIS — Z79890 Hormone replacement therapy (postmenopausal): Secondary | ICD-10-CM | POA: Diagnosis not present

## 2019-11-16 DIAGNOSIS — R112 Nausea with vomiting, unspecified: Secondary | ICD-10-CM | POA: Diagnosis not present

## 2019-11-16 DIAGNOSIS — J929 Pleural plaque without asbestos: Secondary | ICD-10-CM | POA: Diagnosis not present

## 2019-11-16 DIAGNOSIS — K861 Other chronic pancreatitis: Secondary | ICD-10-CM | POA: Diagnosis present

## 2019-11-16 DIAGNOSIS — G459 Transient cerebral ischemic attack, unspecified: Secondary | ICD-10-CM | POA: Diagnosis present

## 2019-11-16 DIAGNOSIS — K8020 Calculus of gallbladder without cholecystitis without obstruction: Secondary | ICD-10-CM | POA: Diagnosis present

## 2019-11-16 DIAGNOSIS — G309 Alzheimer's disease, unspecified: Secondary | ICD-10-CM | POA: Diagnosis present

## 2019-11-16 DIAGNOSIS — K922 Gastrointestinal hemorrhage, unspecified: Secondary | ICD-10-CM | POA: Diagnosis not present

## 2019-11-16 DIAGNOSIS — Z881 Allergy status to other antibiotic agents status: Secondary | ICD-10-CM | POA: Diagnosis not present

## 2019-11-16 DIAGNOSIS — Z79899 Other long term (current) drug therapy: Secondary | ICD-10-CM

## 2019-11-16 DIAGNOSIS — I700 Atherosclerosis of aorta: Secondary | ICD-10-CM | POA: Diagnosis not present

## 2019-11-16 DIAGNOSIS — N179 Acute kidney failure, unspecified: Secondary | ICD-10-CM | POA: Diagnosis not present

## 2019-11-16 DIAGNOSIS — Z7982 Long term (current) use of aspirin: Secondary | ICD-10-CM | POA: Diagnosis not present

## 2019-11-16 DIAGNOSIS — K209 Esophagitis, unspecified without bleeding: Secondary | ICD-10-CM

## 2019-11-16 DIAGNOSIS — Z20822 Contact with and (suspected) exposure to covid-19: Secondary | ICD-10-CM | POA: Diagnosis present

## 2019-11-16 DIAGNOSIS — I129 Hypertensive chronic kidney disease with stage 1 through stage 4 chronic kidney disease, or unspecified chronic kidney disease: Secondary | ICD-10-CM | POA: Diagnosis present

## 2019-11-16 DIAGNOSIS — I2510 Atherosclerotic heart disease of native coronary artery without angina pectoris: Secondary | ICD-10-CM | POA: Diagnosis not present

## 2019-11-16 DIAGNOSIS — R933 Abnormal findings on diagnostic imaging of other parts of digestive tract: Secondary | ICD-10-CM | POA: Diagnosis not present

## 2019-11-16 DIAGNOSIS — N1831 Chronic kidney disease, stage 3a: Secondary | ICD-10-CM | POA: Diagnosis present

## 2019-11-16 DIAGNOSIS — M2550 Pain in unspecified joint: Secondary | ICD-10-CM | POA: Diagnosis not present

## 2019-11-16 DIAGNOSIS — Z823 Family history of stroke: Secondary | ICD-10-CM | POA: Diagnosis not present

## 2019-11-16 DIAGNOSIS — K920 Hematemesis: Secondary | ICD-10-CM

## 2019-11-16 DIAGNOSIS — E039 Hypothyroidism, unspecified: Secondary | ICD-10-CM | POA: Diagnosis present

## 2019-11-16 DIAGNOSIS — D72829 Elevated white blood cell count, unspecified: Secondary | ICD-10-CM | POA: Diagnosis present

## 2019-11-16 DIAGNOSIS — Z8673 Personal history of transient ischemic attack (TIA), and cerebral infarction without residual deficits: Secondary | ICD-10-CM

## 2019-11-16 DIAGNOSIS — E860 Dehydration: Secondary | ICD-10-CM | POA: Diagnosis present

## 2019-11-16 DIAGNOSIS — K449 Diaphragmatic hernia without obstruction or gangrene: Secondary | ICD-10-CM | POA: Diagnosis present

## 2019-11-16 DIAGNOSIS — F0280 Dementia in other diseases classified elsewhere without behavioral disturbance: Secondary | ICD-10-CM | POA: Diagnosis present

## 2019-11-16 DIAGNOSIS — R9431 Abnormal electrocardiogram [ECG] [EKG]: Secondary | ICD-10-CM | POA: Diagnosis not present

## 2019-11-16 DIAGNOSIS — Z66 Do not resuscitate: Secondary | ICD-10-CM | POA: Diagnosis present

## 2019-11-16 DIAGNOSIS — K2101 Gastro-esophageal reflux disease with esophagitis, with bleeding: Secondary | ICD-10-CM | POA: Diagnosis not present

## 2019-11-16 DIAGNOSIS — I248 Other forms of acute ischemic heart disease: Secondary | ICD-10-CM | POA: Diagnosis present

## 2019-11-16 DIAGNOSIS — Z7401 Bed confinement status: Secondary | ICD-10-CM | POA: Diagnosis not present

## 2019-11-16 DIAGNOSIS — F0390 Unspecified dementia without behavioral disturbance: Secondary | ICD-10-CM | POA: Diagnosis not present

## 2019-11-16 DIAGNOSIS — Z96653 Presence of artificial knee joint, bilateral: Secondary | ICD-10-CM | POA: Diagnosis present

## 2019-11-16 DIAGNOSIS — Z91012 Allergy to eggs: Secondary | ICD-10-CM

## 2019-11-16 DIAGNOSIS — R4182 Altered mental status, unspecified: Secondary | ICD-10-CM | POA: Diagnosis not present

## 2019-11-16 DIAGNOSIS — Z87891 Personal history of nicotine dependence: Secondary | ICD-10-CM

## 2019-11-16 DIAGNOSIS — R52 Pain, unspecified: Secondary | ICD-10-CM | POA: Diagnosis not present

## 2019-11-16 DIAGNOSIS — R58 Hemorrhage, not elsewhere classified: Secondary | ICD-10-CM | POA: Diagnosis not present

## 2019-11-16 DIAGNOSIS — R1111 Vomiting without nausea: Secondary | ICD-10-CM | POA: Diagnosis not present

## 2019-11-16 DIAGNOSIS — I10 Essential (primary) hypertension: Secondary | ICD-10-CM | POA: Diagnosis present

## 2019-11-16 DIAGNOSIS — J9811 Atelectasis: Secondary | ICD-10-CM | POA: Diagnosis not present

## 2019-11-16 LAB — COMPREHENSIVE METABOLIC PANEL
ALT: 24 U/L (ref 0–44)
AST: 31 U/L (ref 15–41)
Albumin: 3.8 g/dL (ref 3.5–5.0)
Alkaline Phosphatase: 69 U/L (ref 38–126)
Anion gap: 11 (ref 5–15)
BUN: 47 mg/dL — ABNORMAL HIGH (ref 8–23)
CO2: 29 mmol/L (ref 22–32)
Calcium: 9.4 mg/dL (ref 8.9–10.3)
Chloride: 104 mmol/L (ref 98–111)
Creatinine, Ser: 2.04 mg/dL — ABNORMAL HIGH (ref 0.61–1.24)
GFR calc Af Amer: 33 mL/min — ABNORMAL LOW (ref >60)
GFR calc non Af Amer: 28 mL/min — ABNORMAL LOW (ref >60)
Glucose, Bld: 136 mg/dL — ABNORMAL HIGH (ref 70–99)
Potassium: 4 mmol/L (ref 3.5–5.1)
Sodium: 144 mmol/L (ref 135–145)
Total Bilirubin: 0.5 mg/dL (ref 0.3–1.2)
Total Protein: 7.2 g/dL (ref 6.5–8.1)

## 2019-11-16 LAB — CBC WITH DIFFERENTIAL/PLATELET
Abs Immature Granulocytes: 0.17 10*3/uL — ABNORMAL HIGH (ref 0.00–0.07)
Basophils Absolute: 0.1 10*3/uL (ref 0.0–0.1)
Basophils Relative: 0 %
Eosinophils Absolute: 0.1 10*3/uL (ref 0.0–0.5)
Eosinophils Relative: 0 %
HCT: 30.1 % — ABNORMAL LOW (ref 39.0–52.0)
Hemoglobin: 9.2 g/dL — ABNORMAL LOW (ref 13.0–17.0)
Immature Granulocytes: 1 %
Lymphocytes Relative: 5 %
Lymphs Abs: 0.9 10*3/uL (ref 0.7–4.0)
MCH: 26.5 pg (ref 26.0–34.0)
MCHC: 30.6 g/dL (ref 30.0–36.0)
MCV: 86.7 fL (ref 80.0–100.0)
Monocytes Absolute: 1.6 10*3/uL — ABNORMAL HIGH (ref 0.1–1.0)
Monocytes Relative: 10 %
Neutro Abs: 13.7 10*3/uL — ABNORMAL HIGH (ref 1.7–7.7)
Neutrophils Relative %: 84 %
Platelets: 197 10*3/uL (ref 150–400)
RBC: 3.47 MIL/uL — ABNORMAL LOW (ref 4.22–5.81)
RDW: 15.5 % (ref 11.5–15.5)
WBC: 16.4 10*3/uL — ABNORMAL HIGH (ref 4.0–10.5)
nRBC: 0 % (ref 0.0–0.2)

## 2019-11-16 LAB — RESPIRATORY PANEL BY RT PCR (FLU A&B, COVID)
Influenza A by PCR: NEGATIVE
Influenza B by PCR: NEGATIVE
SARS Coronavirus 2 by RT PCR: NEGATIVE

## 2019-11-16 LAB — TROPONIN I (HIGH SENSITIVITY)
Troponin I (High Sensitivity): 21 ng/L — ABNORMAL HIGH (ref <18)
Troponin I (High Sensitivity): 22 ng/L — ABNORMAL HIGH (ref <18)

## 2019-11-16 LAB — POC OCCULT BLOOD, ED: Fecal Occult Bld: POSITIVE — AB

## 2019-11-16 LAB — VALPROIC ACID LEVEL: Valproic Acid Lvl: <10 ug/mL — ABNORMAL LOW (ref 50.0–100.0)

## 2019-11-16 LAB — LIPASE, BLOOD: Lipase: 19 U/L (ref 11–51)

## 2019-11-16 MED ORDER — ONDANSETRON HCL 4 MG/2ML IJ SOLN: 4.0000 mg | Freq: Four times a day (QID) | INTRAMUSCULAR | Status: DC | PRN

## 2019-11-16 MED ORDER — PANTOPRAZOLE SODIUM 40 MG IV SOLR
40.0000 mg | Freq: Two times a day (BID) | INTRAVENOUS | Status: DC
  Administered 2019-11-17 – 2019-11-19 (×5): 40 mg via INTRAVENOUS
  Filled 2019-11-16 (×5): qty 40

## 2019-11-16 MED ORDER — HYDRALAZINE HCL 20 MG/ML IJ SOLN: 5.0000 mg | INTRAMUSCULAR | Status: DC | PRN

## 2019-11-16 MED ORDER — PANTOPRAZOLE SODIUM 40 MG IV SOLR
40.0000 mg | Freq: Once | INTRAVENOUS | Status: AC
  Administered 2019-11-16: 40 mg via INTRAVENOUS
  Filled 2019-11-16: qty 40

## 2019-11-16 MED ORDER — SODIUM CHLORIDE 0.9 % IV BOLUS
1000.0000 mL | Freq: Once | INTRAVENOUS | Status: AC
  Administered 2019-11-16: 1000 mL via INTRAVENOUS

## 2019-11-16 MED ORDER — SODIUM CHLORIDE 0.9 % IV SOLN: INTRAVENOUS | Status: AC

## 2019-11-16 MED ORDER — LEVOTHYROXINE SODIUM 100 MCG/5ML IV SOLN
50.0000 ug | Freq: Every day | INTRAVENOUS | Status: DC
  Administered 2019-11-17: 50 ug via INTRAVENOUS
  Filled 2019-11-16: qty 5

## 2019-11-16 MED ORDER — VALPROATE SODIUM 500 MG/5ML IV SOLN
125.0000 mg | Freq: Every day | INTRAVENOUS | Status: DC
  Administered 2019-11-17 – 2019-11-18 (×2): 125 mg via INTRAVENOUS
  Filled 2019-11-16 (×3): qty 1.25

## 2019-11-16 MED ORDER — VALPROATE SODIUM 500 MG/5ML IV SOLN
250.0000 mg | Freq: Once | INTRAVENOUS | Status: AC
  Administered 2019-11-17: 250 mg via INTRAVENOUS
  Filled 2019-11-16: qty 2.5

## 2019-11-16 NOTE — ED Provider Notes (Signed)
[facility] DEPT Provider Note   CSN: [id number] Arrival date & time: 11/16/19  1843     History Chief Complaint  Patient presents with   Nausea    Coleston Sarff is a 86 y.o. male.  Level 5 caveat for dementia.  Patient sent from nursing facility with nausea and vomiting.  He is unable to give any history and does not know why he is here.  EMS reports facility said he was still having nausea and vomiting and the Zofran was not working.  Patient with recent admission for coffee-ground emesis had an EGD showing esophagitis.  He was discharged 2 days ago.  Unclear whether his emesis is still coffee-ground.  Patient denies any chest pain or shortness of breath.  No abdominal pain.  He does not think he has been vomiting. He has no complaints.  The history is provided by the patient, the EMS personnel and the nursing home. The history is limited by the condition of the patient.       Past Medical History:  Diagnosis Date   Acid reflux    Carotid artery occlusion    Hypertension    Hypothyroidism    Thyroid disease     Patient Active Problem List   Diagnosis Date Noted   CKD (chronic kidney disease), stage III 11/13/2019   Upper GI bleed 11/12/2019   GI bleeding 11/11/2019   Cognitive dysfunction 10/25/2019   History of CVA (cerebrovascular accident) 10/25/2019   Anemia 10/25/2019   Hyperlipidemia 09/26/2019   Essential hypertension 09/26/2019   TIA due to embolism ([place]) 09/26/2019   H/O carotid endarterectomy 09/26/2019   Carotid artery stenosis, symptomatic, right 07/25/2019   CVA (cerebral vascular accident) ([place]) 07/11/2019   TIA (transient ischemic attack) 07/10/2019   Adjustment disorder with mixed anxiety and depressed mood 06/20/2018   Vitamin D insufficiency 06/20/2018   History of anemia 06/20/2018   Medically noncompliant-   does not want to go for hearing eval 06/20/2018   Bilateral impacted cerumen  02/07/2018   Hearing difficulty of both ears 02/03/2018   Environmental and seasonal allergies 02/03/2018   Eustachian tube dysfunction, bilateral 02/03/2018   History of vitamin D deficiency 01/30/2018   Contact dermatitis and eczema 10/19/2017   HTN (hypertension) 06/22/2017   GERD (gastroesophageal reflux disease) 06/22/2017   Hx of colonoscopy 06/22/2017   Mood disorder ([place]) 06/22/2017   Hypothyroidism 06/22/2017   Chronic pancreatitis ([place]) 06/22/2017    Past Surgical History:  Procedure Laterality Date   CAROTID ENDARTERECTOMY Right 07/25/2019   COLONOSCOPY     ENDARTERECTOMY Right 07/25/2019   Procedure: RIGHT ENDARTERECTOMY CAROTID with patch angioplasty;  Surgeon: Rosetta Posner, MD;  Location: [facility] OR;  Service: Vascular;  Laterality: Right;   ESOPHAGOGASTRODUODENOSCOPY     ESOPHAGOGASTRODUODENOSCOPY Left 11/13/2019   Procedure: ESOPHAGOGASTRODUODENOSCOPY (EGD);  Surgeon: Carol Ada, MD;  Location: [facility];  Service: Endoscopy;  Laterality: Left;   EYE SURGERY Bilateral    cataracts   INGUINAL HERNIA REPAIR     LIPOMA EXCISION     MELANOMA EXCISION     REPLACEMENT TOTAL KNEE BILATERAL         Family History  Problem Relation Age of Onset   Stroke Mother    Stroke Father     Social History   Tobacco Use   Smoking status: Former Smoker    Quit date: 1953    Years since quitting: 48.1   Smokeless tobacco: Former Systems developer    Types: Loss adjuster, chartered  Substance Use Topics   Alcohol use: Yes    Comment: 2oz Vodka a day - family regulates daily   Drug use: No    Home Medications Prior to Admission medications   Medication Sig Start Date End Date Taking? Authorizing Provider  amLODipine (NORVASC) 5 MG tablet Take 1 tablet (5 mg total) by mouth daily. 11/14/19   Rai, Vernelle Emerald, MD  aspirin 81 MG EC tablet Take 1 tablet (81 mg total) by mouth daily. 11/15/19   Rai, Vernelle Emerald, MD  atorvastatin (LIPITOR) 40 MG tablet Take 1 tablet (40 mg total) by  mouth daily at 6 PM. No more rfs-->  Future Refills per long term care facility pt is entering jan 25-27, 2021 10/25/19   Opalski, Deborah, DO  divalproex (DEPAKOTE) 125 MG DR tablet Take 1 tablet (125 mg total) by mouth daily. 10/01/19   Frann Rider, NP  fluticasone (FLONASE) 50 MCG/ACT nasal spray Place 2 sprays into both nostrils daily as needed for allergies or rhinitis.    [provider]  iron polysaccharides (NIFEREX) 150 MG capsule Take 1 capsule (150 mg total) by mouth daily. 11/14/19   Rai, Vernelle Emerald, MD  lipase/protease/amylase (CREON) 36000 UNITS CPEP capsule Take 36,000 Units by mouth 3 (three) times daily with meals.    [provider]  magnesium oxide (MAG-OX) 400 (241.3 Mg) MG tablet Take 1 tablet (400 mg total) by mouth daily. 07/12/19   Geradine Girt, DO  pantoprazole (PROTONIX) 40 MG tablet Take 1 tablet (40 mg total) by mouth daily. 11/14/19   Rai, Vernelle Emerald, MD  QUEtiapine (SEROQUEL) 25 MG tablet Take 0.5 tablets (12.5 mg total) by mouth at bedtime. 11/14/19   Rai, Vernelle Emerald, MD  senna (SENOKOT) 8.6 MG TABS tablet Take 1 tablet (8.6 mg total) by mouth 2 (two) times daily. 11/14/19   Rai, Vernelle Emerald, MD  sertraline (ZOLOFT) 25 MG tablet Take 1 tablet (25 mg total) by mouth daily. No more rfs-->  Future Refills per long term care facility pt is entering jan 25-27, 2021 10/25/19   Mellody Dance, DO  Skin Protectants, Misc. (EUCERIN) cream APPLY TWICE DAILY AS NEEDED FOR RASH OR ITCHING Patient taking differently: Apply 1 application topically every 12 (twelve) hours as needed (rash or itching).  12/04/18   Mellody Dance, DO  thyroid (NP THYROID) 60 MG tablet TAKE 1 TABLET BY MOUTH ONCE DAILY BEFORE BREAKFAST  No more rfs-->  Future Refills per long term care facility pt is entering jan 25-27, 2021 Patient taking differently: Take 60 mg by mouth daily before breakfast.  10/25/19   Opalski, Neoma Laming, DO  triamcinolone cream (KENALOG) 0.1 % Apply 1 application  topically as needed (rash).    [provider]  Vitamin D, Ergocalciferol, (DRISDOL) 1.25 MG (50000 UNIT) CAPS capsule Take 1 capsule (50,000 Units total) by mouth every 7 (seven) days. No more rfs-->  Future Refills per long term care facility pt is entering jan 25-27, 2021 10/25/19   Mellody Dance, DO    Allergies    Doxycycline and Eggs or egg-derived products  Review of Systems   Review of Systems  Unable to perform ROS: Dementia    Physical Exam Updated Vital Signs SpO2 96%   Physical Exam Vitals and nursing note reviewed.  Constitutional:      General: He is not in acute distress.    Appearance: He is well-developed.  HENT:     Head: Normocephalic and atraumatic.     Mouth/Throat:  Pharynx: No oropharyngeal exudate.  Eyes:     Conjunctiva/sclera: Conjunctivae normal.     Pupils: Pupils are equal, round, and reactive to light.  Neck:     Comments: No meningismus. Cardiovascular:     Rate and Rhythm: Normal rate and regular rhythm.     Heart sounds: Normal heart sounds. No murmur.  Pulmonary:     Effort: Pulmonary effort is normal. No respiratory distress.     Breath sounds: Normal breath sounds.  Abdominal:     Palpations: Abdomen is soft.     Tenderness: There is no abdominal tenderness. There is no guarding or rebound.  Genitourinary:    Comments: Chaperone present, no gross blood Musculoskeletal:        General: No tenderness. Normal range of motion.     Cervical back: Normal range of motion and neck supple.  Skin:    General: Skin is warm.  Neurological:     Mental Status: He is alert and oriented to person, place, and time.     Cranial Nerves: No cranial nerve deficit.     Motor: No abnormal muscle tone.     Coordination: Coordination normal.     Comments: No ataxia on finger to nose bilaterally. No pronator drift. 5/5 strength throughout. CN 2-12 intact.Equal grip strength. Sensation intact.   Psychiatric:        Behavior: Behavior normal.      ED Results / Procedures / Treatments   Labs (all labs ordered are listed, but only abnormal results are displayed) Labs Reviewed  CBC WITH DIFFERENTIAL/PLATELET - Abnormal; Notable for the following components:      Result Value   WBC 16.4 (*)    RBC 3.47 (*)    Hemoglobin 9.2 (*)    HCT 30.1 (*)    Neutro Abs 13.7 (*)    Monocytes Absolute 1.6 (*)    Abs Immature Granulocytes 0.17 (*)    All other components within normal limits  COMPREHENSIVE METABOLIC PANEL - Abnormal; Notable for the following components:   Glucose, Bld 136 (*)    BUN 47 (*)    Creatinine, Ser 2.04 (*)    GFR calc non Af Amer 28 (*)    GFR calc Af Amer 33 (*)    All other components within normal limits  VALPROIC ACID LEVEL - Abnormal; Notable for the following components:   Valproic Acid Lvl <10 (*)    All other components within normal limits  POC OCCULT BLOOD, ED - Abnormal; Notable for the following components:   Fecal Occult Bld POSITIVE (*)    All other components within normal limits  TROPONIN I (HIGH SENSITIVITY) - Abnormal; Notable for the following components:   Troponin I (High Sensitivity) 21 (*)    All other components within normal limits  RESPIRATORY PANEL BY RT PCR (FLU A&B, COVID)  LIPASE, BLOOD  TYPE AND SCREEN  TROPONIN I (HIGH SENSITIVITY)    EKG EKG Interpretation  Date/Time:  Friday November 16 2019 19:40:04 EST Ventricular Rate:  94 PR Interval:    QRS Duration: 102 QT Interval:  366 QTC Calculation: 458 R Axis:   -31 Text Interpretation: Sinus or ectopic atrial rhythm Prolonged PR interval Left axis deviation Probable anteroseptal infarct, old Baseline wander in lead(s) II III aVF No significant change was found Confirmed by Ezequiel Essex [phone]) on 11/16/2019 7:49:26 PM   Radiology CT ABDOMEN PELVIS WO CONTRAST  Result Date: 11/16/2019 CLINICAL DATA:  Nausea and vomiting EXAM: CT ABDOMEN AND PELVIS  WITHOUT CONTRAST TECHNIQUE: Multidetector CT imaging of the  abdomen and pelvis was performed following the standard protocol without IV contrast. COMPARISON:  CT Feb 26, 2016 FINDINGS: Lower chest: Hypoattenuation the cardiac blood pool relative the myocardium is suggestive of anemia. Cardiac size upper limits normal. Extensive coronary artery calcifications are present. No pericardial effusion. Streaky and bandlike opacities in the lung bases likely reflect areas of atelectasis and/or scarring. No consolidative opacity. Small right pleural effusion. Hepatobiliary: Few fluid attenuation cysts in the liver are again noted, better seen on comparison contrast-enhanced CT from 2017. The largest is a 1.5 cm cyst in the anterior left lobe liver, essentially unchanged in size and appearance from comparison. A previously seen fluid attenuation cyst along the falciform ligament is no longer present and may reflect remote interval cyst rupture or collapse. No new worrisome hepatic lesions. Smooth liver surface contour. Calcified gallstone layers dependently within the gallbladder. No gallbladder wall thickening, pericholecystic fluid or biliary ductal dilatation. No calcified intraductal gallstones. Pancreas: Punctate calculus the head of the pancreas (2/28) similar to prior and could reflect sequela of prior inflammation. No pancreatic ductal dilatation or surrounding inflammatory changes. Spleen: Normal in size without focal abnormality. Adrenals/Urinary Tract: Normal adrenal glands. No worrisome adrenal lesions. Several fluid attenuation cysts present both kidneys including a larger 2.2 cm cyst in the posterior right upper pole. No worrisome renal lesions. No urolithiasis or hydronephrosis. Mild bilateral symmetric perinephric stranding is similar to prior, a nonspecific finding though may correlate with either age or decreased renal function. Mild thickening of the urinary bladder. Stomach/Bowel: There is circumferential mural thickening of the distal thoracic esophagus with a  moderate hiatal hernia and a small amount of distal paraesophageal stranding and trace fluid (2/7) distal stomach is unremarkable. Duodenal sweep takes a normal course. No small bowel dilatation or wall thickening. A normal appendix is visualized. No colonic dilatation or wall thickening. Scattered colonic diverticula without focal pericolonic inflammation to suggest diverticulitis. Vascular/Lymphatic: Extensive atherosclerotic plaque throughout the aorta and abdominal branch vessels. Luminal evaluation is precluded in the absence of contrast. No suspicious or enlarged lymph nodes in the included lymphatic chains. Reproductive: The prostate and seminal vesicles are unremarkable. Other: Question small amount of fluid and stranding adjacent the distal thoracic esophagus, as above. No abdominopelvic free air or fluid. No bowel containing hernias. Musculoskeletal: Mild levoconvex curvature of the lumbar spine with an apex at L3. Multilevel degenerative changes are present in the imaged portions of the spine. Interspinous arthrosis compatible with Baastrup's disease is noted. Additional degenerative changes noted in both hips and SI joints. No acute osseous abnormality or suspicious osseous lesion. IMPRESSION: 1. There is circumferential mural thickening of the distal thoracic esophagus with a moderate hiatal hernia and a small amount of distal paraesophageal stranding and trace fluid. Findings are compatible with reported history of esophagitis though mural integrity is not fully assessed on this exam. A CT of the chest could be obtained to further characterize the extent of inflammation, otherwise consider GI consultation and evaluation with endoscopy. 2. Small right pleural effusion. 3. Cholelithiasis without CT evidence of acute cholecystitis. 4.  Aortic Atherosclerosis (ICD10-I70.0). These results were called by telephone at the time of interpretation on 11/16/2019 at 9:27 pm to provider [facility] , who  verbally acknowledged these results. Electronically Signed   By: Lovena Le M.D.   On: 11/16/2019 21:27   CT Chest Wo Contrast  Result Date: 11/16/2019 CLINICAL DATA:  Nausea and vomiting. Abnormal thoracic esophagus noted at  CT. EXAM: CT CHEST WITHOUT CONTRAST TECHNIQUE: Multidetector CT imaging of the chest was performed following the standard protocol without IV contrast. COMPARISON:  CT abdomen same day FINDINGS: Cardiovascular: Mild cardiac enlargement. Extensive coronary artery calcification. Aortic atherosclerotic calcification. Mediastinum/Nodes: No evidence of mediastinal lymphadenopathy. Majority of the esophagus is either thick-walled or full of isodense fluid. Findings could be seen with diffuse esophagitis. I do not see evidence of a perforation. Lungs/Pleura: Benign appearing pleural and parenchymal scarring at both lung apices. Small amount of pleural fluid on the right layering dependently. Minimal bilateral dependent atelectasis. Mild linear scarring at the left lung base. No sign of mass or active infiltrate. Upper Abdomen: Negative except for previously seen liver cyst completely described at abdominal CT. Musculoskeletal: Ankylosis through the thoracic region. No acute finding. IMPRESSION: Esophagus appears diffusely thick walled or full of isodense fluid. Findings could be consistent with diffuse thoracic esophagitis. I do not see evidence of a focal mass lesion. No evidence perforation. Aortic atherosclerosis.  Coronary artery calcification. Electronically Signed   By: Nelson Chimes M.D.   On: 11/16/2019 21:58    Procedures Procedures (including critical care time)  Medications Ordered in ED Medications - No data to display  ED Course  I have reviewed the triage vital signs and the nursing notes.  Pertinent labs & imaging results that were available during my care of the patient were reviewed by me and considered in my medical decision making (see chart for details).    MDM  Rules/Calculators/A&P                      Patient sent from facility with recurrent nausea and vomiting.  Recent admission for esophagitis and coffee-ground emesis.  Patient here with no complaints.  Unable to contact his family members for his living facility.  Left messages with no return calls.  Recent admission for 5 days ground emesis and discharged 2 days ago.  EGD on February 10 showed LA Grade D reflux esophagitis with no bleeding. - Normal stomach. - Normal examined duodenum. - No specimens collected.  Hemoglobin today is 9.2 and stable  Discussed with patient's daughter [name].  She was not aware of his transfer back to the ED.  She was not contacted by the facility.  She knows he was admitted for blood transfusion with coffee-ground emesis and esophagitis and was sent back to his facility on November 14 2019.  Family was able to speak with nursing home who reported that patient had several episodes of questionable coffee-ground emesis.  He has mild acute kidney injury here and will be hydrated.  Call from radiology Dr. Marguerita Merles.  CT scan shows severe esophagitis with some free fluid along the mediastinum.  He is concerned for possible Mallory-Weiss tear but cannot rule out frank esophageal rupture.  Recommends CT scan of the chest and discussion with GI.  CT scan of the chest as above.  Shows thickened and fluid-filled esophagus without evidence of perforation.  Discussed with Dr. Loletha Carrow gastroenterology who is covering for Dr. Adriana Mccallum.  He agrees with medical admission for IV fluids and monitoring.  Does not think he will need any further endoscopy.  Plan for overnight observation for rehydration and recheck of his creatinine.  Will also need monitoring for further episodes of hematemesis.  Discussed with Dr. Marlowe Sax.    Final Clinical Impression(s) / ED Diagnoses Final diagnoses:  AKI (acute kidney injury) ([place])  Coffee ground emesis    Rx / DC Orders  ED Discharge  Orders    None       Siyana Erney, Annie Main, MD 11/16/19 2238

## 2019-11-16 NOTE — H&P (Signed)
History and Physical    Christian Bennett [id number] DOB: 02-18-1928 DOA: 11/16/2019  PCP: Mellody Dance, DO Patient coming from: Nursing home  Chief Complaint: Coffee ground emesis  HPI: Christian Bennett is a 86 y.o. male with medical history significant of Alzheimer's dementia with sundowning, hypertension, TIA, CKD stage III, hypothyroidism, carotid artery occlusion status post right CEA, GERD, chronic pancreatitis presenting to the ED from his nursing home for evaluation of coffee-ground emesis.  EMS reports facility staff he was still having nausea and vomiting and Zofran was not working.  Patient not sure why he is here.  Oriented to self only.  No history could be obtained from him. He was recently admitted 2/7-2/10 for acute blood loss anemia anemia secondary to upper GI bleed.  Underwent EGD which revealed grade D reflux esophagitis with no bleeding.  GI recommended PPI daily indefinitely.  Patient received 1 unit PRBCs during this hospitalization and hemoglobin was 9.3 at the time of discharge.  Baseline hemoglobin around 8.    ED Course: FOBT positive.  Hemoglobin stable at 9.2.  WBC count 16.4.  BUN 47, creatinine 2.0.  Baseline creatinine 1.1-1.3.  Lipase and LFTs normal.  Initial high-sensitivity troponin 21, repeat pending.  Valproic acid level undetectable.  SARS-CoV-2 PCR test pending. CT chest showing circumferential mural thickening of the distal thoracic esophagus with a moderate hiatal hernia and a small amount of distal paraesophageal stranding and trace fluid. Findings are compatible with reported history of esophagitis though mural integrity is not fully assessed on this exam.  CT chest recommended for further characterization.  Also showing cholelithiasis without evidence of acute cholecystitis. CT chest showing esophagus appearing diffusely thick-walled or full of isodense fluid.  Findings could be consistent with diffuse thoracic esophagitis.  No evidence of a focal mass  lesion.  No evidence of perforation. ED provider discussed the case with Dr. Loletha Carrow from GI who was covering for Dr. Adriana Mccallum.  He recommended IV fluids and monitoring at this time and feels the patient will not need any further endoscopy.  Patient received IV Protonix 40 mg and 1 L normal saline bolus.  Review of Systems:  All systems reviewed and apart from history of presenting illness, are negative.  Past Medical History:  Diagnosis Date  . Acid reflux   . Carotid artery occlusion   . Hypertension   . Hypothyroidism   . Thyroid disease     Past Surgical History:  Procedure Laterality Date  . CAROTID ENDARTERECTOMY Right 07/25/2019  . COLONOSCOPY    . ENDARTERECTOMY Right 07/25/2019   Procedure: RIGHT ENDARTERECTOMY CAROTID with patch angioplasty;  Surgeon: Rosetta Posner, MD;  Location: [facility];  Service: Vascular;  Laterality: Right;  . ESOPHAGOGASTRODUODENOSCOPY    . ESOPHAGOGASTRODUODENOSCOPY Left 11/13/2019   Procedure: ESOPHAGOGASTRODUODENOSCOPY (EGD);  Surgeon: Carol Ada, MD;  Location: [facility];  Service: Endoscopy;  Laterality: Left;  . EYE SURGERY Bilateral    cataracts  . INGUINAL HERNIA REPAIR    . LIPOMA EXCISION    . MELANOMA EXCISION    . REPLACEMENT TOTAL KNEE BILATERAL       reports that he quit smoking about 68 years ago. He has quit using smokeless tobacco.  His smokeless tobacco use included chew. He reports current alcohol use. He reports that he does not use drugs.  Allergies  Allergen Reactions  . Doxycycline Hives  . Eggs Or Egg-Derived Products     UNSPECIFIED REACTION  >> "Sick"    Family History  Problem Relation Age  of Onset  . Stroke Mother   . Stroke Father     Prior to Admission medications   Medication Sig Start Date End Date Taking? Authorizing Provider  amLODipine (NORVASC) 5 MG tablet Take 1 tablet (5 mg total) by mouth daily. 11/14/19  Yes Rai, Ripudeep K, MD  aspirin 81 MG EC tablet Take 1 tablet (81 mg total) by mouth  daily. 11/15/19  Yes Rai, Ripudeep K, MD  atorvastatin (LIPITOR) 40 MG tablet Take 1 tablet (40 mg total) by mouth daily at 6 PM. No more rfs-->  Future Refills per long term care facility pt is entering jan 25-27, 2021 10/25/19  Yes Opalski, Deborah, DO  divalproex (DEPAKOTE) 125 MG DR tablet Take 1 tablet (125 mg total) by mouth daily. 10/01/19  Yes McCue, Janett Billow, NP  fluticasone (FLONASE) 50 MCG/ACT nasal spray Place 2 sprays into both nostrils daily as needed for allergies or rhinitis.   Yes [provider]  iron polysaccharides (NIFEREX) 150 MG capsule Take 1 capsule (150 mg total) by mouth daily. 11/14/19  Yes Rai, Ripudeep K, MD  lipase/protease/amylase (CREON) 36000 UNITS CPEP capsule Take 36,000 Units by mouth 3 (three) times daily with meals.   Yes [provider]  magnesium oxide (MAG-OX) 400 (241.3 Mg) MG tablet Take 1 tablet (400 mg total) by mouth daily. 07/12/19  Yes Vann, Jessica U, DO  pantoprazole (PROTONIX) 40 MG tablet Take 1 tablet (40 mg total) by mouth daily. 11/14/19  Yes Rai, Ripudeep K, MD  QUEtiapine (SEROQUEL) 25 MG tablet Take 0.5 tablets (12.5 mg total) by mouth at bedtime. 11/14/19  Yes Rai, Ripudeep K, MD  senna (SENOKOT) 8.6 MG TABS tablet Take 1 tablet (8.6 mg total) by mouth 2 (two) times daily. 11/14/19  Yes Rai, Ripudeep K, MD  sertraline (ZOLOFT) 25 MG tablet Take 1 tablet (25 mg total) by mouth daily. No more rfs-->  Future Refills per long term care facility pt is entering jan 25-27, 2021 10/25/19  Yes Opalski, Neoma Laming, DO  Skin Protectants, Misc. (EUCERIN) cream APPLY TWICE DAILY AS NEEDED FOR RASH OR ITCHING Patient taking differently: Apply 1 application topically every 12 (twelve) hours as needed (rash or itching).  12/04/18  Yes Opalski, Neoma Laming, DO  thyroid (NP THYROID) 60 MG tablet TAKE 1 TABLET BY MOUTH ONCE DAILY BEFORE BREAKFAST  No more rfs-->  Future Refills per long term care facility pt is entering jan 25-27, 2021 Patient taking  differently: Take 60 mg by mouth daily before breakfast.  10/25/19  Yes Opalski, Neoma Laming, DO  triamcinolone cream (KENALOG) 0.1 % Apply 1 application topically as needed (rash).   Yes [provider]  Vitamin D, Ergocalciferol, (DRISDOL) 1.25 MG (50000 UNIT) CAPS capsule Take 1 capsule (50,000 Units total) by mouth every 7 (seven) days. No more rfs-->  Future Refills per long term care facility pt is entering jan 25-27, 2021 10/25/19  Yes Opalski, Neoma Laming, DO    Physical Exam: Vitals:   11/16/19 1852 11/16/19 2000 11/16/19 2200  BP:  (!) 142/77 (!) 143/68  Pulse:  90 85  Resp:  (!) 24 13  SpO2: 96% 93% 98%    Physical Exam  Constitutional: He appears well-developed and well-nourished. No distress.  HENT:  Head: Normocephalic.  Eyes: Right eye exhibits no discharge. Left eye exhibits no discharge.  Cardiovascular: Normal rate, regular rhythm and intact distal pulses.  Pulmonary/Chest: Effort normal. No respiratory distress. He has no wheezes. He has no rales.  Abdominal: Soft. Bowel sounds are  normal. He exhibits no distension. There is no abdominal tenderness. There is no guarding.  Musculoskeletal:        General: No edema.     Cervical back: Neck supple.  Neurological:  Awake Oriented to self only  Skin: Skin is warm and dry. He is not diaphoretic.     Labs on Admission: I have personally reviewed following labs and imaging studies  CBC: Recent Labs  Lab 11/11/19 1112 11/11/19 1112 11/12/19 0519 11/13/19 0546 11/14/19 0504 11/14/19 1510 11/16/19 1906  WBC 9.1  --  6.6 11.5* 8.7  --  16.4*  NEUTROABS 7.1  --   --   --   --   --  13.7*  HGB 8.9*   < > 7.5* 8.1* 7.8* 9.3* 9.2*  HCT 29.9*   < > 25.1* 27.1* 26.6* 29.9* 30.1*  MCV 88.2  --  88.1 88.0 87.5  --  86.7  PLT 200  --  164 190 176  --  197   < > = values in this interval not displayed.   Basic Metabolic Panel: Recent Labs  Lab 11/11/19 1112 11/12/19 0519 11/13/19 0546 11/14/19 0504 11/16/19 1906   NA 144 141 140 138 144  K 4.2 3.9 3.5 3.4* 4.0  CL 105 105 106 105 104  CO2 30 29 27 27 29   GLUCOSE 103* 89 94 96 136*  BUN 37* 26* 23 22 47*  CREATININE 1.30* 1.27* 1.34* 1.19 2.04*  CALCIUM 9.5 9.1 9.3 9.1 9.4   GFR: Estimated Creatinine Clearance: 24.5 mL/min (A) (by C-G formula based on SCr of 2.04 mg/dL (H)). Liver Function Tests: Recent Labs  Lab 11/11/19 1112 11/16/19 1906  AST 24 31  ALT 25 24  ALKPHOS 69 69  BILITOT 0.5 0.5  PROT 7.4 7.2  ALBUMIN 4.1 3.8   Recent Labs  Lab 11/11/19 1112 11/16/19 1906  LIPASE 26 19   No results for input(s): AMMONIA in the last 168 hours. Coagulation Profile: No results for input(s): INR, PROTIME in the last 168 hours. Cardiac Enzymes: No results for input(s): CKTOTAL, CKMB, CKMBINDEX, TROPONINI in the last 168 hours. BNP (last 3 results) No results for input(s): PROBNP in the last 8760 hours. HbA1C: No results for input(s): HGBA1C in the last 72 hours. CBG: No results for input(s): GLUCAP in the last 168 hours. Lipid Profile: No results for input(s): CHOL, HDL, LDLCALC, TRIG, CHOLHDL, LDLDIRECT in the last 72 hours. Thyroid Function Tests: No results for input(s): TSH, T4TOTAL, FREET4, T3FREE, THYROIDAB in the last 72 hours. Anemia Panel: No results for input(s): VITAMINB12, FOLATE, FERRITIN, TIBC, IRON, RETICCTPCT in the last 72 hours. Urine analysis:    Component Value Date/Time   COLORURINE YELLOW 07/23/2019 1026   APPEARANCEUR CLEAR 07/23/2019 1026   LABSPEC 1.021 07/23/2019 1026   PHURINE 5.0 07/23/2019 1026   GLUCOSEU NEGATIVE 07/23/2019 1026   HGBUR NEGATIVE 07/23/2019 1026   BILIRUBINUR NEGATIVE 07/23/2019 1026   KETONESUR NEGATIVE 07/23/2019 1026   PROTEINUR NEGATIVE 07/23/2019 1026   UROBILINOGEN 0.2 02/18/2011 1015   NITRITE NEGATIVE 07/23/2019 1026   LEUKOCYTESUR TRACE (A) 07/23/2019 1026    Radiological Exams on Admission: CT ABDOMEN PELVIS WO CONTRAST  Result Date: 11/16/2019 CLINICAL DATA:   Nausea and vomiting EXAM: CT ABDOMEN AND PELVIS WITHOUT CONTRAST TECHNIQUE: Multidetector CT imaging of the abdomen and pelvis was performed following the standard protocol without IV contrast. COMPARISON:  CT Feb 26, 2016 FINDINGS: Lower chest: Hypoattenuation the cardiac blood pool relative the myocardium is  suggestive of anemia. Cardiac size upper limits normal. Extensive coronary artery calcifications are present. No pericardial effusion. Streaky and bandlike opacities in the lung bases likely reflect areas of atelectasis and/or scarring. No consolidative opacity. Small right pleural effusion. Hepatobiliary: Few fluid attenuation cysts in the liver are again noted, better seen on comparison contrast-enhanced CT from 2017. The largest is a 1.5 cm cyst in the anterior left lobe liver, essentially unchanged in size and appearance from comparison. A previously seen fluid attenuation cyst along the falciform ligament is no longer present and may reflect remote interval cyst rupture or collapse. No new worrisome hepatic lesions. Smooth liver surface contour. Calcified gallstone layers dependently within the gallbladder. No gallbladder wall thickening, pericholecystic fluid or biliary ductal dilatation. No calcified intraductal gallstones. Pancreas: Punctate calculus the head of the pancreas (2/28) similar to prior and could reflect sequela of prior inflammation. No pancreatic ductal dilatation or surrounding inflammatory changes. Spleen: Normal in size without focal abnormality. Adrenals/Urinary Tract: Normal adrenal glands. No worrisome adrenal lesions. Several fluid attenuation cysts present both kidneys including a larger 2.2 cm cyst in the posterior right upper pole. No worrisome renal lesions. No urolithiasis or hydronephrosis. Mild bilateral symmetric perinephric stranding is similar to prior, a nonspecific finding though may correlate with either age or decreased renal function. Mild thickening of the urinary  bladder. Stomach/Bowel: There is circumferential mural thickening of the distal thoracic esophagus with a moderate hiatal hernia and a small amount of distal paraesophageal stranding and trace fluid (2/7) distal stomach is unremarkable. Duodenal sweep takes a normal course. No small bowel dilatation or wall thickening. A normal appendix is visualized. No colonic dilatation or wall thickening. Scattered colonic diverticula without focal pericolonic inflammation to suggest diverticulitis. Vascular/Lymphatic: Extensive atherosclerotic plaque throughout the aorta and abdominal branch vessels. Luminal evaluation is precluded in the absence of contrast. No suspicious or enlarged lymph nodes in the included lymphatic chains. Reproductive: The prostate and seminal vesicles are unremarkable. Other: Question small amount of fluid and stranding adjacent the distal thoracic esophagus, as above. No abdominopelvic free air or fluid. No bowel containing hernias. Musculoskeletal: Mild levoconvex curvature of the lumbar spine with an apex at L3. Multilevel degenerative changes are present in the imaged portions of the spine. Interspinous arthrosis compatible with Baastrup's disease is noted. Additional degenerative changes noted in both hips and SI joints. No acute osseous abnormality or suspicious osseous lesion. IMPRESSION: 1. There is circumferential mural thickening of the distal thoracic esophagus with a moderate hiatal hernia and a small amount of distal paraesophageal stranding and trace fluid. Findings are compatible with reported history of esophagitis though mural integrity is not fully assessed on this exam. A CT of the chest could be obtained to further characterize the extent of inflammation, otherwise consider GI consultation and evaluation with endoscopy. 2. Small right pleural effusion. 3. Cholelithiasis without CT evidence of acute cholecystitis. 4.  Aortic Atherosclerosis (ICD10-I70.0). These results were called by  telephone at the time of interpretation on 11/16/2019 at 9:27 pm to provider [facility] , who verbally acknowledged these results. Electronically Signed   By: Lovena Le M.D.   On: 11/16/2019 21:27   CT Chest Wo Contrast  Result Date: 11/16/2019 CLINICAL DATA:  Nausea and vomiting. Abnormal thoracic esophagus noted at CT. EXAM: CT CHEST WITHOUT CONTRAST TECHNIQUE: Multidetector CT imaging of the chest was performed following the standard protocol without IV contrast. COMPARISON:  CT abdomen same day FINDINGS: Cardiovascular: Mild cardiac enlargement. Extensive coronary artery calcification. Aortic atherosclerotic  calcification. Mediastinum/Nodes: No evidence of mediastinal lymphadenopathy. Majority of the esophagus is either thick-walled or full of isodense fluid. Findings could be seen with diffuse esophagitis. I do not see evidence of a perforation. Lungs/Pleura: Benign appearing pleural and parenchymal scarring at both lung apices. Small amount of pleural fluid on the right layering dependently. Minimal bilateral dependent atelectasis. Mild linear scarring at the left lung base. No sign of mass or active infiltrate. Upper Abdomen: Negative except for previously seen liver cyst completely described at abdominal CT. Musculoskeletal: Ankylosis through the thoracic region. No acute finding. IMPRESSION: Esophagus appears diffusely thick walled or full of isodense fluid. Findings could be consistent with diffuse thoracic esophagitis. I do not see evidence of a focal mass lesion. No evidence perforation. Aortic atherosclerosis.  Coronary artery calcification. Electronically Signed   By: Nelson Chimes M.D.   On: 11/16/2019 21:58    EKG: Independently reviewed.  Sinus rhythm, PR prolongation similar to prior tracing, baseline wander in inferior lateral leads.  Assessment/Plan Principal Problem:   Upper GI bleed Active Problems:   HTN (hypertension)   Hypothyroidism   Chronic pancreatitis (HCC)   TIA  (transient ischemic attack)   Esophagitis   AKI (acute kidney injury) ([place])   Upper GI bleed/coffee-ground emesis in the setting of severe grade D reflux esophagitis Chest CT with findings consistent with diffuse thoracic esophagitis and no evidence of perforation.  FOBT positive.  Hemoglobin stable at 9.2.  No episodes of coffee-ground emesis since patient has been in the ED. Hemodynamically stable. -ED provider discussed the case with Dr. Loletha Carrow from GI who recommended IV fluids and monitoring at this time and feels the patient will not need any further endoscopy. -Monitor closely, keep n.p.o. at this time -IV PPI twice daily -IV fluid hydration -IV Zofran as needed -Type and screen -Monitor CBC, transfuse PRBCs if significant drop in hemoglobin  AKI on CKD stage III Likely prerenal due to dehydration in the setting of emesis.  BUN 27, creatinine 2.0.  Baseline creatinine 1.1-1.3. -IV fluid hydration -Continue to monitor renal function and urine output -Avoid nephrotoxic agents  Leukocytosis Possibly reactive.  WBC count 16.4.  No temperature recorded in the ED.  Chest imaging without evidence of pneumonia. -Check body temperature -Check UA -Continue to monitor WBC count  Mild troponin elevation Likely secondary to demand ischemia.  No complaints of chest pain. Initial high-sensitivity troponin 21.  EKG without acute ischemic changes. -Cardiac monitoring -Trend troponin  Alzheimer's dementia with sundowning Patient gets agitated in the evenings and experiences sundowning. -It seems he has not been receiving Depakote as valproic acid level undetectable. Give IV Valproate and switch to p.o. form when patient is no longer n.p.o. -Resume home Seroquel and Zoloft when no longer n.p.o.  Hypertension -Stable.  IV hydralazine as needed for SBP >170.  Resume home p.o. medication when no longer n.p.o.  Hypothyroidism -Thyroid hormone replacement therapy in IV form at this time.  Resume  home p.o. medication when no longer n.p.o. -Check TSH level given PR prolongation EKG  Carotid artery occlusion status post CEA -Hold aspirin at this time given active GI bleed  History of TIA -Hold aspirin at this time given active GI bleed -Resume Lipitor when no longer n.p.o.  Chronic pancreatitis -Resume home Creon when no longer n.p.o.  DVT prophylaxis: SCDs at this time Code Status: DNR based on prior hospital records. Family Communication: No family available at this time. Disposition Plan: Anticipate discharge after clinical improvement. Consults called: GI Admission status:  It is my clinical opinion that admission to [facility] is reasonable and necessary in this 86 y.o. male . presenting with upper GI bleed/coffee-ground emesis in the setting of severe grade D reflux esophagitis.  Very high risk of decompensation.  Needs to be on IV PPI and close monitoring for recurrence of hematemesis.  Given the aforementioned, the predictability of an adverse outcome is felt to be significant. I expect that the patient will require at least 2 midnights in the hospital to treat this condition.   The medical decision making on this patient was of high complexity and the patient is at high risk for clinical deterioration, therefore this is a level 3 visit.  Shela Leff MD [facility]  If 7PM-7AM, please contact night-coverage [email or web address] Password [email or web address]  11/16/2019, 11:18 PM

## 2019-11-16 NOTE — ED Triage Notes (Signed)
Per EMS, Pt is coming from [facility] on [address]. Pt was seen recently for N/V. Pt prescribed Zofran on discharge, per staff the meds have not been working. Pt sent out to be evaluated again. Hx of dementia, alert to baseline.

## 2019-11-17 DIAGNOSIS — K922 Gastrointestinal hemorrhage, unspecified: Secondary | ICD-10-CM

## 2019-11-17 DIAGNOSIS — K920 Hematemesis: Secondary | ICD-10-CM

## 2019-11-17 DIAGNOSIS — R933 Abnormal findings on diagnostic imaging of other parts of digestive tract: Secondary | ICD-10-CM

## 2019-11-17 DIAGNOSIS — K2101 Gastro-esophageal reflux disease with esophagitis, with bleeding: Principal | ICD-10-CM

## 2019-11-17 LAB — BASIC METABOLIC PANEL
Anion gap: 7 (ref 5–15)
BUN: 42 mg/dL — ABNORMAL HIGH (ref 8–23)
CO2: 30 mmol/L (ref 22–32)
Calcium: 8.9 mg/dL (ref 8.9–10.3)
Chloride: 108 mmol/L (ref 98–111)
Creatinine, Ser: 1.7 mg/dL — ABNORMAL HIGH (ref 0.61–1.24)
GFR calc Af Amer: 41 mL/min — ABNORMAL LOW (ref >60)
GFR calc non Af Amer: 35 mL/min — ABNORMAL LOW (ref >60)
Glucose, Bld: 124 mg/dL — ABNORMAL HIGH (ref 70–99)
Potassium: 3.9 mmol/L (ref 3.5–5.1)
Sodium: 145 mmol/L (ref 135–145)

## 2019-11-17 LAB — HEMOGLOBIN AND HEMATOCRIT, BLOOD
HCT: 26.8 % — ABNORMAL LOW (ref 39.0–52.0)
HCT: 26.5 % — ABNORMAL LOW (ref 39.0–52.0)
Hemoglobin: 8 g/dL — ABNORMAL LOW (ref 13.0–17.0)
Hemoglobin: 7.9 g/dL — ABNORMAL LOW (ref 13.0–17.0)

## 2019-11-17 LAB — CBC
HCT: 28.4 % — ABNORMAL LOW (ref 39.0–52.0)
Hemoglobin: 8.6 g/dL — ABNORMAL LOW (ref 13.0–17.0)
MCH: 26.6 pg (ref 26.0–34.0)
MCHC: 30.3 g/dL (ref 30.0–36.0)
MCV: 87.9 fL (ref 80.0–100.0)
Platelets: 189 10*3/uL (ref 150–400)
RBC: 3.23 MIL/uL — ABNORMAL LOW (ref 4.22–5.81)
RDW: 15.6 % — ABNORMAL HIGH (ref 11.5–15.5)
WBC: 13.4 10*3/uL — ABNORMAL HIGH (ref 4.0–10.5)
nRBC: 0 % (ref 0.0–0.2)

## 2019-11-17 LAB — MRSA PCR SCREENING: MRSA by PCR: NEGATIVE

## 2019-11-17 LAB — TSH: TSH: 1.19 u[IU]/mL (ref 0.350–4.500)

## 2019-11-17 MED ORDER — SERTRALINE HCL 25 MG PO TABS
25.0000 mg | ORAL_TABLET | Freq: Every day | ORAL | Status: DC
  Administered 2019-11-17 – 2019-11-19 (×3): 25 mg via ORAL
  Filled 2019-11-17 (×3): qty 1

## 2019-11-17 MED ORDER — ATORVASTATIN CALCIUM 40 MG PO TABS
40.0000 mg | ORAL_TABLET | Freq: Every day | ORAL | Status: DC
  Administered 2019-11-17 – 2019-11-18 (×2): 40 mg via ORAL
  Filled 2019-11-17 (×2): qty 1

## 2019-11-17 MED ORDER — ONDANSETRON HCL 4 MG/2ML IJ SOLN
4.0000 mg | Freq: Three times a day (TID) | INTRAMUSCULAR | Status: DC
  Administered 2019-11-17 – 2019-11-18 (×4): 4 mg via INTRAVENOUS
  Filled 2019-11-17 (×4): qty 2

## 2019-11-17 MED ORDER — QUETIAPINE FUMARATE 25 MG PO TABS
12.5000 mg | ORAL_TABLET | Freq: Every day | ORAL | Status: DC
  Administered 2019-11-17 – 2019-11-18 (×2): 12.5 mg via ORAL
  Filled 2019-11-17 (×3): qty 1

## 2019-11-17 MED ORDER — PANCRELIPASE (LIP-PROT-AMYL) 12000-38000 UNITS PO CPEP
36000.0000 [IU] | ORAL_CAPSULE | Freq: Three times a day (TID) | ORAL | Status: DC
  Administered 2019-11-17 – 2019-11-19 (×5): 36000 [IU] via ORAL
  Filled 2019-11-17 (×2): qty 3
  Filled 2019-11-17: qty 1
  Filled 2019-11-17 (×2): qty 3

## 2019-11-17 MED ORDER — CHLORHEXIDINE GLUCONATE CLOTH 2 % EX PADS: 6.0000 | MEDICATED_PAD | Freq: Every day | CUTANEOUS | Status: DC

## 2019-11-17 MED ORDER — LACTATED RINGERS IV SOLN: INTRAVENOUS | Status: AC

## 2019-11-17 MED ORDER — SODIUM CHLORIDE 0.9 % IV SOLN
INTRAVENOUS | Status: DC | PRN
  Administered 2019-11-17 – 2019-11-18 (×2): 1000 mL via INTRAVENOUS

## 2019-11-17 MED ORDER — ORAL CARE MOUTH RINSE
15.0000 mL | Freq: Two times a day (BID) | OROMUCOSAL | Status: DC
  Administered 2019-11-17 – 2019-11-19 (×5): 15 mL via OROMUCOSAL

## 2019-11-17 MED ORDER — THYROID 60 MG PO TABS
60.0000 mg | ORAL_TABLET | Freq: Every day | ORAL | Status: DC
  Administered 2019-11-18 – 2019-11-19 (×2): 60 mg via ORAL
  Filled 2019-11-17 (×2): qty 1

## 2019-11-17 MED ORDER — AMLODIPINE BESYLATE 5 MG PO TABS
5.0000 mg | ORAL_TABLET | Freq: Every day | ORAL | Status: DC
  Administered 2019-11-17 – 2019-11-19 (×3): 5 mg via ORAL
  Filled 2019-11-17 (×3): qty 1

## 2019-11-17 NOTE — Progress Notes (Signed)
Pt arrived to unit from ICU. VSS. Safety sitter at bedside. Voices no pain at this time. Pt remains very restless and confused. Cardiac monitor applied. Currently Sinus Rhythm with a HR of 74 bpm. SpO2 96% on room air. Bed alarm on. Call light within reach. Will cont to mx.

## 2019-11-17 NOTE — Consult Note (Addendum)
[facility] covering for Dr. Benson Norway  Referring Provider:  [facility]         Primary Care Physician:  Mellody Dance, DO Primary Gastroenterologist:   Dr. Benson Norway           We were asked to see this patient for:    Nausea , vomiting, hematemesis              ASSESSMENT /  PLAN    86 yo male with pmh significant for Alzheimers' dementia, HTN, TIA, CKD 3, hypothyroidism, carotid artery disease, GERD, 6 cm HH  #  Recurrent nausea, vomiting, hematemesis. Grade D reflux esophagitis on inpatient EGD a few days ago.  CT scan abd / pelvis yesterday shows circumferential mural thickening of distal thoracic esophagus with small amount of distal paraesophageal stranding and trace fluid.  --No vomiting since admission, hasn't required any anti-emetics. I think it is a good idea to give Zofran to prevent nausea from starting up again. Will order scheduled Zofran Q 8 hours for now  --Continue BID IV PPI --He was lying completely flat in bed. I asked RN to keep [facility] elevated 45 degrees to prevent reflux --When discharged back to facility please resume Zegerid. Daughter says patient gets episodes of N/V every time Zegerid is stopped ( he was just started on protonix upon hospital discharge).    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 Hgb improved from 7.8 to 9.3. Drifted now to 8.6.  --Monitor hgb, transfuse if needed.   #  Leukocytosis. Reactive? Afebrile. No acute findings on CXR. Improving. WBC 16 >>> 13.   #  AKI on CKD. Improved with IV hydration.  Cr 1.19 >>> 1.7  #  Mildly elevated troponin, ? Demand. No chest pain. No acute EKG changes.   #  Cholelithiasis. No evidence for cholecystitis on CT scan  HPI:    Chief Complaint: none  Christian Bennett is a 86 y.o. male male with dementia. History comes from chart. Admitted a few days ago with hematemesis. Inpatient EGD remarkable for Grade D reflux esophagitis. He required a unit of blood. Discharged back to nursing facility. Brought  back to ED las evening with vomiting despite Zofran.  Per RN patient hasn't had any vomiting since today. Not requiring anti-emetics. Daughter told RN than patient has these episodes every time Zegerid is stopped ( he was discharged from hospital on Protonix).     Past Medical History:  Diagnosis Date  . Acid reflux   . Carotid artery occlusion   . Hypertension   . Hypothyroidism   . Thyroid disease     Past Surgical History:  Procedure Laterality Date  . CAROTID ENDARTERECTOMY Right 07/25/2019  . COLONOSCOPY    . ENDARTERECTOMY Right 07/25/2019   Procedure: RIGHT ENDARTERECTOMY CAROTID with patch angioplasty;  Surgeon: Rosetta Posner, MD;  Location: [facility];  Service: Vascular;  Laterality: Right;  . ESOPHAGOGASTRODUODENOSCOPY    . ESOPHAGOGASTRODUODENOSCOPY Left 11/13/2019   Procedure: ESOPHAGOGASTRODUODENOSCOPY (EGD);  Surgeon: Carol Ada, MD;  Location: [facility];  Service: Endoscopy;  Laterality: Left;  . EYE SURGERY Bilateral    cataracts  . INGUINAL HERNIA REPAIR    . LIPOMA EXCISION    . MELANOMA EXCISION    . REPLACEMENT TOTAL KNEE BILATERAL      Prior to Admission medications   Medication Sig Start Date End Date Taking? Authorizing Provider  amLODipine (NORVASC) 5 MG tablet Take 1 tablet (5 mg total) by mouth daily. 11/14/19  Yes Rai,  Ripudeep K, MD  aspirin 81 MG EC tablet Take 1 tablet (81 mg total) by mouth daily. 11/15/19  Yes Rai, Ripudeep K, MD  atorvastatin (LIPITOR) 40 MG tablet Take 1 tablet (40 mg total) by mouth daily at 6 PM. No more rfs-->  Future Refills per long term care facility pt is entering jan 25-27, 2021 10/25/19  Yes Opalski, Deborah, DO  divalproex (DEPAKOTE) 125 MG DR tablet Take 1 tablet (125 mg total) by mouth daily. 10/01/19  Yes McCue, Janett Billow, NP  fluticasone (FLONASE) 50 MCG/ACT nasal spray Place 2 sprays into both nostrils daily as needed for allergies or rhinitis.   Yes [provider]  iron polysaccharides (NIFEREX) 150 MG  capsule Take 1 capsule (150 mg total) by mouth daily. 11/14/19  Yes Rai, Ripudeep K, MD  lipase/protease/amylase (CREON) 36000 UNITS CPEP capsule Take 36,000 Units by mouth 3 (three) times daily with meals.   Yes [provider]  magnesium oxide (MAG-OX) 400 (241.3 Mg) MG tablet Take 1 tablet (400 mg total) by mouth daily. 07/12/19  Yes Vann, Jessica U, DO  pantoprazole (PROTONIX) 40 MG tablet Take 1 tablet (40 mg total) by mouth daily. 11/14/19  Yes Rai, Ripudeep K, MD  QUEtiapine (SEROQUEL) 25 MG tablet Take 0.5 tablets (12.5 mg total) by mouth at bedtime. 11/14/19  Yes Rai, Ripudeep K, MD  senna (SENOKOT) 8.6 MG TABS tablet Take 1 tablet (8.6 mg total) by mouth 2 (two) times daily. 11/14/19  Yes Rai, Ripudeep K, MD  sertraline (ZOLOFT) 25 MG tablet Take 1 tablet (25 mg total) by mouth daily. No more rfs-->  Future Refills per long term care facility pt is entering jan 25-27, 2021 10/25/19  Yes Opalski, Neoma Laming, DO  Skin Protectants, Misc. (EUCERIN) cream APPLY TWICE DAILY AS NEEDED FOR RASH OR ITCHING Patient taking differently: Apply 1 application topically every 12 (twelve) hours as needed (rash or itching).  12/04/18  Yes Opalski, Neoma Laming, DO  thyroid (NP THYROID) 60 MG tablet TAKE 1 TABLET BY MOUTH ONCE DAILY BEFORE BREAKFAST  No more rfs-->  Future Refills per long term care facility pt is entering jan 25-27, 2021 Patient taking differently: Take 60 mg by mouth daily before breakfast.  10/25/19  Yes Opalski, Neoma Laming, DO  triamcinolone cream (KENALOG) 0.1 % Apply 1 application topically as needed (rash).   Yes [provider]  Vitamin D, Ergocalciferol, (DRISDOL) 1.25 MG (50000 UNIT) CAPS capsule Take 1 capsule (50,000 Units total) by mouth every 7 (seven) days. No more rfs-->  Future Refills per long term care facility pt is entering jan 25-27, 2021 10/25/19  Yes Opalski, Neoma Laming, DO    Current Facility-Administered Medications  Medication Dose Route Frequency Provider Last Rate  Last Admin  . 0.9 %  sodium chloride infusion   Intravenous PRN Elodia Florence., MD   Stopped at 11/17/19 1040  . Chlorhexidine Gluconate Cloth 2 % PADS 6 each  6 each Topical Daily Shela Leff, MD      . hydrALAZINE (APRESOLINE) injection 5 mg  5 mg Intravenous Q4H PRN Shela Leff, MD      . levothyroxine (SYNTHROID, LEVOTHROID) injection 50 mcg  50 mcg Intravenous Daily Shela Leff, MD   50 mcg at 11/17/19 1031  . MEDLINE mouth rinse  15 mL Mouth Rinse BID Elodia Florence., MD   15 mL at 11/17/19 1123  . ondansetron (ZOFRAN) injection 4 mg  4 mg Intravenous Q6H PRN Shela Leff, MD      .  pantoprazole (PROTONIX) injection 40 mg  40 mg Intravenous Q12H Shela Leff, MD   40 mg at 11/17/19 1031  . valproate (DEPACON) 125 mg in dextrose 5 % 50 mL IVPB  125 mg Intravenous Daily Shela Leff, MD 51.3 mL/hr at 11/17/19 1100 Rate Verify at 11/17/19 1100    Allergies as of 11/16/2019 - Review Complete 11/16/2019  Allergen Reaction Noted  . Doxycycline Hives 02/05/2014  . Eggs or egg-derived products  07/10/2019    Family History  Problem Relation Age of Onset  . Stroke Mother   . Stroke Father     Social History   Socioeconomic History  . Marital status: Widowed    Spouse name: Not on file  . Number of children: Not on file  . Years of education: Not on file  . Highest education level: Not on file  Occupational History  . Occupation: retired  Tobacco Use  . Smoking status: Former Smoker    Quit date: 1953    Years since quitting: 68.1  . Smokeless tobacco: Former Systems developer    Types: Chew  Substance and Sexual Activity  . Alcohol use: Yes    Comment: 2oz Vodka a day - family regulates daily  . Drug use: No  . Sexual activity: Not Currently    Birth control/protection: None  Other Topics Concern  . Not on file  Social History Narrative  . Not on file   Social Determinants of Health   Financial Resource Strain: Low Risk   .  Difficulty of Paying Living Expenses: Not hard at all  Food Insecurity: No Food Insecurity  . Worried About Charity fundraiser in the Last Year: Never true  . Ran Out of Food in the Last Year: Never true  Transportation Needs: Unknown  . Lack of Transportation (Medical): No  . Lack of Transportation (Non-Medical): Not on file  Physical Activity: Inactive  . Days of Exercise per Week: 0 days  . Minutes of Exercise per Session: 0 min  Stress: No Stress Concern Present  . Feeling of Stress : Only a little  Social Connections: Slightly Isolated  . Frequency of Communication with Friends and Family: Twice a week  . Frequency of Social Gatherings with Friends and Family: Once a week  . Attends Religious Services: More than 4 times per year  . Active Member of Clubs or Organizations: Yes  . Attends Archivist Meetings: 1 to 4 times per year  . Marital Status: Widowed  Intimate Partner Violence: Not At Risk  . Fear of Current or Ex-Partner: No  . Emotionally Abused: No  . Physically Abused: No  . Sexually Abused: No    Review of Systems: All systems reviewed and negative except where noted in HPI.  Physical Exam: Vital signs in last 24 hours: Temp:  [97.7 F (36.5 C)] 97.7 F (36.5 C) (02/13 0323) Pulse Rate:  [71-90] 80 (02/13 0900) Resp:  [13-24] 16 (02/13 0900) BP: (100-156)/(39-77) 141/52 (02/13 0900) SpO2:  [91 %-100 %] 100 % (02/13 0900)   General:   Alert, confused male, NAD in soft restraints Psych:  cooperative.  Eyes:  Pupils equal, sclera clear, no icterus.   Conjunctiva pink. Ears:  Normal auditory acuity. Nose:  No deformity, discharge,  or lesions. Neck:  Supple; no masses Lungs:  Clear throughout to auscultation.   No wheezes, crackles, or rhonchi.  Heart:  Regular rate , no lower extremity edema Abdomen:  Soft, non-distended, nontender, BS active, no palp mass  Rectal:  Deferred  Msk:  Symmetrical without gross deformities. . Neurologic:  Alert ,  grossly normal neurologically.    Intake/Output from previous day: 02/12 0701 - 02/13 0700 In: 1000 [IV Piggyback:1000] Out: 100 [Urine:100] Intake/Output this shift: Total I/O In: 91.6 [I.V.:76.1; IV Piggyback:15.5] Out: -   Lab Results: Recent Labs    11/14/19 1510 11/16/19 1906 11/17/19 0151  WBC  --  16.4* 13.4*  HGB 9.3* 9.2* 8.6*  HCT 29.9* 30.1* 28.4*  PLT  --  197 189   BMET Recent Labs    11/16/19 1906 11/17/19 0151  NA 144 145  K 4.0 3.9  CL 104 108  CO2 29 30  GLUCOSE 136* 124*  BUN 47* 42*  CREATININE 2.04* 1.70*  CALCIUM 9.4 8.9   LFT Recent Labs    11/16/19 1906  PROT 7.2  ALBUMIN 3.8  AST 31  ALT 24  ALKPHOS 69  BILITOT 0.5   PT/INR No results for input(s): LABPROT, INR in the last 72 hours. Hepatitis Panel No results for input(s): HEPBSAG, HCVAB, HEPAIGM, HEPBIGM in the last 72 hours.   . CBC Latest Ref Rng & Units 11/17/2019 11/16/2019 11/14/2019  WBC 4.0 - 10.5 K/uL 13.4(H) 16.4(H) -  Hemoglobin 13.0 - 17.0 g/dL 8.6(L) 9.2(L) 9.3(L)  Hematocrit 39.0 - 52.0 % 28.4(L) 30.1(L) 29.9(L)  Platelets 150 - 400 K/uL 189 197 -    . CMP Latest Ref Rng & Units 11/17/2019 11/16/2019 11/14/2019  Glucose 70 - 99 mg/dL 124(H) 136(H) 96  BUN 8 - 23 mg/dL 42(H) 47(H) 22  Creatinine 0.61 - 1.24 mg/dL 1.70(H) 2.04(H) 1.19  Sodium 135 - 145 mmol/L 145 144 138  Potassium 3.5 - 5.1 mmol/L 3.9 4.0 3.4(L)  Chloride 98 - 111 mmol/L 108 104 105  CO2 22 - 32 mmol/L 30 29 27   Calcium 8.9 - 10.3 mg/dL 8.9 9.4 9.1  Total Protein 6.5 - 8.1 g/dL - 7.2 -  Total Bilirubin 0.3 - 1.2 mg/dL - 0.5 -  Alkaline Phos 38 - 126 U/L - 69 -  AST 15 - 41 U/L - 31 -  ALT 0 - 44 U/L - 24 -   Studies/Results: CT ABDOMEN PELVIS WO CONTRAST  Result Date: 11/16/2019 CLINICAL DATA:  Nausea and vomiting EXAM: CT ABDOMEN AND PELVIS WITHOUT CONTRAST TECHNIQUE: Multidetector CT imaging of the abdomen and pelvis was performed following the standard protocol without IV contrast.  COMPARISON:  CT Feb 26, 2016 FINDINGS: Lower chest: Hypoattenuation the cardiac blood pool relative the myocardium is suggestive of anemia. Cardiac size upper limits normal. Extensive coronary artery calcifications are present. No pericardial effusion. Streaky and bandlike opacities in the lung bases likely reflect areas of atelectasis and/or scarring. No consolidative opacity. Small right pleural effusion. Hepatobiliary: Few fluid attenuation cysts in the liver are again noted, better seen on comparison contrast-enhanced CT from 2017. The largest is a 1.5 cm cyst in the anterior left lobe liver, essentially unchanged in size and appearance from comparison. A previously seen fluid attenuation cyst along the falciform ligament is no longer present and may reflect remote interval cyst rupture or collapse. No new worrisome hepatic lesions. Smooth liver surface contour. Calcified gallstone layers dependently within the gallbladder. No gallbladder wall thickening, pericholecystic fluid or biliary ductal dilatation. No calcified intraductal gallstones. Pancreas: Punctate calculus the head of the pancreas (2/28) similar to prior and could reflect sequela of prior inflammation. No pancreatic ductal dilatation or surrounding inflammatory changes. Spleen: Normal in size without focal abnormality. Adrenals/Urinary  Tract: Normal adrenal glands. No worrisome adrenal lesions. Several fluid attenuation cysts present both kidneys including a larger 2.2 cm cyst in the posterior right upper pole. No worrisome renal lesions. No urolithiasis or hydronephrosis. Mild bilateral symmetric perinephric stranding is similar to prior, a nonspecific finding though may correlate with either age or decreased renal function. Mild thickening of the urinary bladder. Stomach/Bowel: There is circumferential mural thickening of the distal thoracic esophagus with a moderate hiatal hernia and a small amount of distal paraesophageal stranding and trace  fluid (2/7) distal stomach is unremarkable. Duodenal sweep takes a normal course. No small bowel dilatation or wall thickening. A normal appendix is visualized. No colonic dilatation or wall thickening. Scattered colonic diverticula without focal pericolonic inflammation to suggest diverticulitis. Vascular/Lymphatic: Extensive atherosclerotic plaque throughout the aorta and abdominal branch vessels. Luminal evaluation is precluded in the absence of contrast. No suspicious or enlarged lymph nodes in the included lymphatic chains. Reproductive: The prostate and seminal vesicles are unremarkable. Other: Question small amount of fluid and stranding adjacent the distal thoracic esophagus, as above. No abdominopelvic free air or fluid. No bowel containing hernias. Musculoskeletal: Mild levoconvex curvature of the lumbar spine with an apex at L3. Multilevel degenerative changes are present in the imaged portions of the spine. Interspinous arthrosis compatible with Baastrup's disease is noted. Additional degenerative changes noted in both hips and SI joints. No acute osseous abnormality or suspicious osseous lesion. IMPRESSION: 1. There is circumferential mural thickening of the distal thoracic esophagus with a moderate hiatal hernia and a small amount of distal paraesophageal stranding and trace fluid. Findings are compatible with reported history of esophagitis though mural integrity is not fully assessed on this exam. A CT of the chest could be obtained to further characterize the extent of inflammation, otherwise consider GI consultation and evaluation with endoscopy. 2. Small right pleural effusion. 3. Cholelithiasis without CT evidence of acute cholecystitis. 4.  Aortic Atherosclerosis (ICD10-I70.0). These results were called by telephone at the time of interpretation on 11/16/2019 at 9:27 pm to provider [facility] , who verbally acknowledged these results. Electronically Signed   By: Lovena Le M.D.   On:  11/16/2019 21:27   CT Chest Wo Contrast  Result Date: 11/16/2019 CLINICAL DATA:  Nausea and vomiting. Abnormal thoracic esophagus noted at CT. EXAM: CT CHEST WITHOUT CONTRAST TECHNIQUE: Multidetector CT imaging of the chest was performed following the standard protocol without IV contrast. COMPARISON:  CT abdomen same day FINDINGS: Cardiovascular: Mild cardiac enlargement. Extensive coronary artery calcification. Aortic atherosclerotic calcification. Mediastinum/Nodes: No evidence of mediastinal lymphadenopathy. Majority of the esophagus is either thick-walled or full of isodense fluid. Findings could be seen with diffuse esophagitis. I do not see evidence of a perforation. Lungs/Pleura: Benign appearing pleural and parenchymal scarring at both lung apices. Small amount of pleural fluid on the right layering dependently. Minimal bilateral dependent atelectasis. Mild linear scarring at the left lung base. No sign of mass or active infiltrate. Upper Abdomen: Negative except for previously seen liver cyst completely described at abdominal CT. Musculoskeletal: Ankylosis through the thoracic region. No acute finding. IMPRESSION: Esophagus appears diffusely thick walled or full of isodense fluid. Findings could be consistent with diffuse thoracic esophagitis. I do not see evidence of a focal mass lesion. No evidence perforation. Aortic atherosclerosis.  Coronary artery calcification. Electronically Signed   By: Nelson Chimes M.D.   On: 11/16/2019 21:58    Principal Problem:   Upper GI bleed Active Problems:   HTN (hypertension)  Hypothyroidism   Chronic pancreatitis ([place])   TIA (transient ischemic attack)   Esophagitis   AKI (acute kidney injury) ([place])    Tye Savoy, NP-C @  11/17/2019, 11:31 AM  I have reviewed the entire case in detail with the above APP and discussed the plan in detail.  Therefore, I agree with the diagnoses recorded above. In addition,  I have personally interviewed and  examined the patient and have personally reviewed any abdominal/pelvic CT scan images.  My additional thoughts are as follows:  Hematemesis/coffee-ground emesis.  This is from the known severe reflux esophagitis. Reported nausea and vomiting, though none witnessed since admission.  I wonder if this may actually be severe regurgitation.  As noted above, head of bed should always be elevated 30 to 45 degrees to minimize reflux.  Keep patient active and ambulating as much as possible to aid with gastric emptying.  I would avoid prokinetic agent such as metoclopramide given his age. Twice daily oral PPI for 8 weeks, then decrease to once a day.  The patient's daughter reports that he does better with Zegerid than any other acid suppression medicine.  CT scan appearance of the distal esophagus is due to the severe esophagitis.  There is no evidence of bowel obstruction.  Abdominal exam benign  If no witnessed vomiting by tomorrow, please try him on a soft diet and when he is tolerating that, he can be discharged back to his facility for as needed outpatient follow-up with Dr. Benson Norway.  We will sign off for now.  I will sign him out to Dr. Benson Norway tomorrow evening, in case the patient is still here early in the week requiring GI follow-up.   Nelida Meuse III [phone]

## 2019-11-17 NOTE — Progress Notes (Addendum)
PROGRESS NOTE    Christian Bennett  HRC:[id number] DOB: [birth year] DOA: 11/16/2019 PCP: Christian Dance, DO   Brief Narrative:  Christian Bennett is Christian Bennett 86 y.o. male with medical history significant of Alzheimer's dementia with sundowning, hypertension, TIA, CKD stage III, hypothyroidism, carotid artery occlusion status post right CEA, GERD, chronic pancreatitis presenting to the ED from his nursing home for evaluation of coffee-ground emesis.  EMS reports facility staff he was still having nausea and vomiting and Zofran was not working.  Patient not sure why he is here.  Oriented to self only.  No history could be obtained from him. He was recently admitted 2/7-2/10 for acute blood loss anemia anemia secondary to upper GI bleed.  Underwent EGD which revealed grade D reflux esophagitis with no bleeding.  GI recommended PPI daily indefinitely.  Patient received 1 unit PRBCs during this hospitalization and hemoglobin was 9.3 at the time of discharge.  Baseline hemoglobin around 8.    Assessment & Plan:   Principal Problem:   Upper GI bleed Active Problems:   HTN (hypertension)   Hypothyroidism   Chronic pancreatitis (HCC)   TIA (transient ischemic attack)   Esophagitis   AKI (acute kidney injury) ([place])  Upper GI bleed/coffee-ground emesis in the setting of severe grade D reflux esophagitis Chest CT with findings consistent with diffuse thoracic esophagitis and no evidence of perforation.  FOBT positive.  No episodes of coffee-ground emesis since patient hospitalized in ED. Hemodynamically stable. - Hb 8.6 today, follow - GI c/s, appreciate recs  - IV PPI twice daily - IV fluid hydration - IV Zofran as needed - Type and screen - Monitor CBC, transfuse PRBCs if significant drop in hemoglobin  AKI on CKD stage III Likely prerenal due to dehydration in the setting of emesis.  Creatinine on presentation 2.  Baseline creatinine 1.1-1.3. - Improving today -Continue to monitor renal function and  urine output -Avoid nephrotoxic agents  Leukocytosis Possibly reactive.  WBC count 16.4 on presentation.  Afebrile.  Chest imaging without evidence of pneumonia. -Check UA (pending) -Continue to monitor WBC count - improving  Mild troponin elevation Likely secondary to demand ischemia.  No complaints of chest pain. Initial high-sensitivity troponin 21, repeat 22.  EKG without acute ischemic changes.  Not consistent with ACS.  Alzheimer's dementia with sundowning Patient gets agitated in the evenings and experiences sundowning. -It seems he has not been receiving Depakote as valproic acid level undetectable. Give IV Valproate and switch to p.o. form when patient is no longer n.p.o. -seroquel, zoloft  Hypertension - amlodipine, BP reasonable today  Hypothyroidism -Thyroid hormone replacement therapy in IV form at this time.  Resume home p.o. medication when no longer n.p.o. -TSH wnl  Carotid artery occlusion status post CEA -Hold aspirin at this time given active GI bleed  History of TIA -Hold aspirin at this time given active GI bleed -Resume Lipitor when no longer n.p.o.  Chronic pancreatitis -Resume home Creon when no longer n.p.o.  DVT prophylaxis: SCD Code Status: DNR Family Communication: none at bedside - called daughter, no answer Disposition Plan:  . Patient came from: Nursing home           . Anticipated d/c place: home . Barriers to d/c OR conditions which need to be met to effect Christian Bennett safe d/c: improvement in N/V and hematemesis Consultants:   GI  Procedures:   none  Antimicrobials:  Anti-infectives (From admission, onward)   None     Subjective: No complaints Confused, asking about  his shoes  Objective: Vitals:   11/17/19 0400 11/17/19 0500 11/17/19 0800 11/17/19 0900  BP: 100/74 (!) 102/57 (!) 156/51 (!) 141/52  Pulse:  89 80 80  Resp: 17 (!) _0 Temp:      TempSrc:      SpO2:  97% 100% 100%    Intake/Output Summary (Last 24  hours) at 11/17/2019 1159 Last data filed at 11/17/2019 1100 Gross per 24 hour  Intake 1091.58 ml  Output 100 ml  Net 991.58 ml   There were no vitals filed for this visit.  Examination:  General exam: Appears calm and comfortable  Respiratory system: Clear to auscultation. Respiratory effort normal. Cardiovascular system: S1 & S2 heard, RRR.  Gastrointestinal system: Abdomen is nondistended, soft and nontender.  Central nervous system: Alert and disoriented. No focal neurological deficits. Extremities: no LEE Skin: No rashes, lesions or ulcers Psychiatry: Judgement and insight appear normal. Mood & affect appropriate.     Data Reviewed: I have personally reviewed following labs and imaging studies  CBC: Recent Labs  Lab 11/11/19 1112 11/11/19 1112 11/12/19 0519 11/12/19 0519 11/13/19 0546 11/14/19 0504 11/14/19 1510 11/16/19 1906 11/17/19 0151  WBC 9.1   < > 6.6  --  11.5* 8.7  --  16.4* 13.4*  NEUTROABS 7.1  --   --   --   --   --   --  13.7*  --   HGB 8.9*   < > 7.5*   < > 8.1* 7.8* 9.3* 9.2* 8.6*  HCT 29.9*   < > 25.1*   < > 27.1* 26.6* 29.9* 30.1* 28.4*  MCV 88.2   < > 88.1  --  88.0 87.5  --  86.7 87.9  PLT 200   < > 164  --  190 176  --  197 189   < > = values in this interval not displayed.   Basic Metabolic Panel: Recent Labs  Lab 11/12/19 0519 11/13/19 0546 11/14/19 0504 11/16/19 1906 11/17/19 0151  NA 141 140 138 144 145  K 3.9 3.5 3.4* 4.0 3.9  CL 105 106 105 104 108  CO2 _1 GLUCOSE 89 94 96 136* 124*  BUN 26* 23 22 47* 42*  CREATININE 1.27* 1.34* 1.19 2.04* 1.70*  CALCIUM 9.1 9.3 9.1 9.4 8.9   GFR: Estimated Creatinine Clearance: 29.5 mL/min (Christian Bennett) (by C-G formula based on SCr of 1.7 mg/dL (H)). Liver Function Tests: Recent Labs  Lab 11/11/19 1112 11/16/19 1906  AST 24 31  ALT 25 24  ALKPHOS 69 69  BILITOT 0.5 0.5  PROT 7.4 7.2  ALBUMIN 4.1 3.8   Recent Labs  Lab 11/11/19 1112 11/16/19 1906  LIPASE 26 19   No  results for input(s): AMMONIA in the last 168 hours. Coagulation Profile: No results for input(s): INR, PROTIME in the last 168 hours. Cardiac Enzymes: No results for input(s): CKTOTAL, CKMB, CKMBINDEX, TROPONINI in the last 168 hours. BNP (last 3 results) No results for input(s): PROBNP in the last 8760 hours. HbA1C: No results for input(s): HGBA1C in the last 72 hours. CBG: No results for input(s): GLUCAP in the last 168 hours. Lipid Profile: No results for input(s): CHOL, HDL, LDLCALC, TRIG, CHOLHDL, LDLDIRECT in the last 72 hours. Thyroid Function Tests: Recent Labs    11/17/19 0151  TSH 1.190   Anemia Panel: No results for input(s): VITAMINB12, FOLATE, FERRITIN, TIBC, IRON, RETICCTPCT in the last 72 hours. Sepsis Labs: No results for  input(s): PROCALCITON, LATICACIDVEN in the last 168 hours.  Recent Results (from the past 240 hour(s))  Respiratory Panel by RT PCR (Flu Akira Adelsberger&B, Covid) - Nasopharyngeal Swab     Status: None   Collection Time: 11/11/19  1:45 PM   Specimen: Nasopharyngeal Swab  Result Value Ref Range Status   SARS Coronavirus 2 by RT PCR NEGATIVE NEGATIVE Final    Comment: (NOTE) SARS-CoV-2 target nucleic acids are NOT DETECTED. The SARS-CoV-2 RNA is generally detectable in upper respiratoy specimens during the acute phase of infection. The lowest concentration of SARS-CoV-2 viral copies this assay can detect is 131 copies/mL. Tinsley Lomas negative result does not preclude SARS-Cov-2 infection and should not be used as the sole basis for treatment or other patient management decisions. Herta Hink negative result may occur with  improper specimen collection/handling, submission of specimen other than nasopharyngeal swab, presence of viral mutation(s) within the areas targeted by this assay, and inadequate number of viral copies (<131 copies/mL). Ilah Boule negative result must be combined with clinical observations, patient history, and epidemiological information. The expected result is  Negative. Fact Sheet for Patients:  PinkCheek.be Fact Sheet for Healthcare Providers:  GravelBags.it This test is not yet ap proved or cleared by the [place] FDA and  has been authorized for detection and/or diagnosis of SARS-CoV-2 by FDA under an Emergency Use Authorization (EUA). This EUA will remain  in effect (meaning this test can be used) for the duration of the COVID-19 declaration under Section 564(b)(1) of the Act, 21 U.S.C. section 360bbb-3(b)(1), unless the authorization is terminated or revoked sooner.    Influenza Dorreen Valiente by PCR NEGATIVE NEGATIVE Final   Influenza B by PCR NEGATIVE NEGATIVE Final    Comment: (NOTE) The Xpert Xpress SARS-CoV-2/FLU/RSV assay is intended as an aid in  the diagnosis of influenza from Nasopharyngeal swab specimens and  should not be used as Doralee Kocak sole basis for treatment. Nasal washings and  aspirates are unacceptable for Xpert Xpress SARS-CoV-2/FLU/RSV  testing. Fact Sheet for Patients: PinkCheek.be Fact Sheet for Healthcare Providers: GravelBags.it This test is not yet approved or cleared by the [place] FDA and  has been authorized for detection and/or diagnosis of SARS-CoV-2 by  FDA under an Emergency Use Authorization (EUA). This EUA will remain  in effect (meaning this test can be used) for the duration of the  Covid-19 declaration under Section 564(b)(1) of the Act, 21  U.S.C. section 360bbb-3(b)(1), unless the authorization is  terminated or revoked. Performed at [facility], [place] [address] [place], [place] [address]   MRSA PCR Screening     Status: None   Collection Time: 11/11/19  7:43 PM   Specimen: Nasal Mucosa; Nasopharyngeal  Result Value Ref Range Status   MRSA by PCR NEGATIVE NEGATIVE Final    Comment:        The GeneXpert MRSA Assay (FDA approved for NASAL specimens only), is one  component of Hosanna Betley comprehensive MRSA colonization surveillance program. It is not intended to diagnose MRSA infection nor to guide or monitor treatment for MRSA infections. Performed at [facility], [place] [address] [place], [place] [address]   Respiratory Panel by RT PCR (Flu Jazon Jipson&B, Covid) - Nasopharyngeal Swab     Status: None   Collection Time: 11/16/19 10:47 PM   Specimen: Nasopharyngeal Swab  Result Value Ref Range Status   SARS Coronavirus 2 by RT PCR NEGATIVE NEGATIVE Final    Comment: (NOTE) SARS-CoV-2 target nucleic acids are NOT DETECTED. The SARS-CoV-2 RNA  is generally detectable in upper respiratoy specimens during the acute phase of infection. The lowest concentration of SARS-CoV-2 viral copies this assay can detect is 131 copies/mL. Jeris Roser negative result does not preclude SARS-Cov-2 infection and should not be used as the sole basis for treatment or other patient management decisions. Shoni Quijas negative result may occur with  improper specimen collection/handling, submission of specimen other than nasopharyngeal swab, presence of viral mutation(s) within the areas targeted by this assay, and inadequate number of viral copies (<131 copies/mL). Deloma Spindle negative result must be combined with clinical observations, patient history, and epidemiological information. The expected result is Negative. Fact Sheet for Patients:  PinkCheek.be Fact Sheet for Healthcare Providers:  GravelBags.it This test is not yet ap proved or cleared by the [place] FDA and  has been authorized for detection and/or diagnosis of SARS-CoV-2 by FDA under an Emergency Use Authorization (EUA). This EUA will remain  in effect (meaning this test can be used) for the duration of the COVID-19 declaration under Section 564(b)(1) of the Act, 21 U.S.C. section 360bbb-3(b)(1), unless the authorization is terminated or revoked sooner.    Influenza  Graysyn Bache by PCR NEGATIVE NEGATIVE Final   Influenza B by PCR NEGATIVE NEGATIVE Final    Comment: (NOTE) The Xpert Xpress SARS-CoV-2/FLU/RSV assay is intended as an aid in  the diagnosis of influenza from Nasopharyngeal swab specimens and  should not be used as Nikko Quast sole basis for treatment. Nasal washings and  aspirates are unacceptable for Xpert Xpress SARS-CoV-2/FLU/RSV  testing. Fact Sheet for Patients: PinkCheek.be Fact Sheet for Healthcare Providers: GravelBags.it This test is not yet approved or cleared by the [place] FDA and  has been authorized for detection and/or diagnosis of SARS-CoV-2 by  FDA under an Emergency Use Authorization (EUA). This EUA will remain  in effect (meaning this test can be used) for the duration of the  Covid-19 declaration under Section 564(b)(1) of the Act, 21  U.S.C. section 360bbb-3(b)(1), unless the authorization is  terminated or revoked. Performed at [facility], [place] [address], [place], [place] [address]   MRSA PCR Screening     Status: None   Collection Time: 11/17/19 12:58 AM   Specimen: Nasopharyngeal  Result Value Ref Range Status   MRSA by PCR NEGATIVE NEGATIVE Final    Comment:        The GeneXpert MRSA Assay (FDA approved for NASAL specimens only), is one component of Berry Godsey comprehensive MRSA colonization surveillance program. It is not intended to diagnose MRSA infection nor to guide or monitor treatment for MRSA infections. Performed at [facility], [place] [address], [place], [place] [address]          Radiology Studies: CT ABDOMEN PELVIS WO CONTRAST  Result Date: 11/16/2019 CLINICAL DATA:  Nausea and vomiting EXAM: CT ABDOMEN AND PELVIS WITHOUT CONTRAST TECHNIQUE: Multidetector CT imaging of the abdomen and pelvis was performed following the standard protocol without IV contrast. COMPARISON:  CT Feb 26, 2016 FINDINGS: Lower chest:  Hypoattenuation the cardiac blood pool relative the myocardium is suggestive of anemia. Cardiac size upper limits normal. Extensive coronary artery calcifications are present. No pericardial effusion. Streaky and bandlike opacities in the lung bases likely reflect areas of atelectasis and/or scarring. No consolidative opacity. Small right pleural effusion. Hepatobiliary: Few fluid attenuation cysts in the liver are again noted, better seen on comparison contrast-enhanced CT from 2017. The largest is Annelyse Rey 1.5 cm cyst in the anterior left lobe liver, essentially unchanged in size and  appearance from comparison. Adelyna Brockman previously seen fluid attenuation cyst along the falciform ligament is no longer present and may reflect remote interval cyst rupture or collapse. No new worrisome hepatic lesions. Smooth liver surface contour. Calcified gallstone layers dependently within the gallbladder. No gallbladder wall thickening, pericholecystic fluid or biliary ductal dilatation. No calcified intraductal gallstones. Pancreas: Punctate calculus the head of the pancreas (2/28) similar to prior and could reflect sequela of prior inflammation. No pancreatic ductal dilatation or surrounding inflammatory changes. Spleen: Normal in size without focal abnormality. Adrenals/Urinary Tract: Normal adrenal glands. No worrisome adrenal lesions. Several fluid attenuation cysts present both kidneys including Ory Elting larger 2.2 cm cyst in the posterior right upper pole. No worrisome renal lesions. No urolithiasis or hydronephrosis. Mild bilateral symmetric perinephric stranding is similar to prior, Caron Ode nonspecific finding though may correlate with either age or decreased renal function. Mild thickening of the urinary bladder. Stomach/Bowel: There is circumferential mural thickening of the distal thoracic esophagus with Mcihael Hinderman moderate hiatal hernia and Randale Carvalho small amount of distal paraesophageal stranding and trace fluid (2/7) distal stomach is unremarkable. Duodenal  sweep takes Jermane Brayboy normal course. No small bowel dilatation or wall thickening. Gaylin Osoria normal appendix is visualized. No colonic dilatation or wall thickening. Scattered colonic diverticula without focal pericolonic inflammation to suggest diverticulitis. Vascular/Lymphatic: Extensive atherosclerotic plaque throughout the aorta and abdominal branch vessels. Luminal evaluation is precluded in the absence of contrast. No suspicious or enlarged lymph nodes in the included lymphatic chains. Reproductive: The prostate and seminal vesicles are unremarkable. Other: Question small amount of fluid and stranding adjacent the distal thoracic esophagus, as above. No abdominopelvic free air or fluid. No bowel containing hernias. Musculoskeletal: Mild levoconvex curvature of the lumbar spine with an apex at L3. Multilevel degenerative changes are present in the imaged portions of the spine. Interspinous arthrosis compatible with Baastrup's disease is noted. Additional degenerative changes noted in both hips and SI joints. No acute osseous abnormality or suspicious osseous lesion. IMPRESSION: 1. There is circumferential mural thickening of the distal thoracic esophagus with Jaishawn Witzke moderate hiatal hernia and Shirah Roseman small amount of distal paraesophageal stranding and trace fluid. Findings are compatible with reported history of esophagitis though mural integrity is not fully assessed on this exam. Kiaja Shorty CT of the chest could be obtained to further characterize the extent of inflammation, otherwise consider GI consultation and evaluation with endoscopy. 2. Small right pleural effusion. 3. Cholelithiasis without CT evidence of acute cholecystitis. 4.  Aortic Atherosclerosis (ICD10-I70.0). These results were called by telephone at the time of interpretation on 11/16/2019 at 9:27 pm to provider [facility] , who verbally acknowledged these results. Electronically Signed   By: Lovena Le M.D.   On: 11/16/2019 21:27   CT Chest Wo Contrast  Result Date:  11/16/2019 CLINICAL DATA:  Nausea and vomiting. Abnormal thoracic esophagus noted at CT. EXAM: CT CHEST WITHOUT CONTRAST TECHNIQUE: Multidetector CT imaging of the chest was performed following the standard protocol without IV contrast. COMPARISON:  CT abdomen same day FINDINGS: Cardiovascular: Mild cardiac enlargement. Extensive coronary artery calcification. Aortic atherosclerotic calcification. Mediastinum/Nodes: No evidence of mediastinal lymphadenopathy. Majority of the esophagus is either thick-walled or full of isodense fluid. Findings could be seen with diffuse esophagitis. I do not see evidence of Tasha Diaz perforation. Lungs/Pleura: Benign appearing pleural and parenchymal scarring at both lung apices. Small amount of pleural fluid on the right layering dependently. Minimal bilateral dependent atelectasis. Mild linear scarring at the left lung base. No sign of mass or active infiltrate. Upper Abdomen:  Negative except for previously seen liver cyst completely described at abdominal CT. Musculoskeletal: Ankylosis through the thoracic region. No acute finding. IMPRESSION: Esophagus appears diffusely thick walled or full of isodense fluid. Findings could be consistent with diffuse thoracic esophagitis. I do not see evidence of Ryler Laskowski focal mass lesion. No evidence perforation. Aortic atherosclerosis.  Coronary artery calcification. Electronically Signed   By: Nelson Chimes M.D.   On: 11/16/2019 21:58        Scheduled Meds: . Chlorhexidine Gluconate Cloth  6 each Topical Daily  . levothyroxine  50 mcg Intravenous Daily  . mouth rinse  15 mL Mouth Rinse BID  . pantoprazole (PROTONIX) IV  40 mg Intravenous Q12H   Continuous Infusions: . sodium chloride Stopped (11/17/19 1040)  . valproate sodium 51.3 mL/hr at 11/17/19 1100     LOS: 1 day    Time spent: over 30 min    Fayrene Helper, MD [facility]   To contact the attending provider between 7A-7P or the covering provider during after hours  7P-7A, please log into the web site [email or web address] and access using universal [place] password for that web site. If you do not have the password, please call the hospital operator.  11/17/2019, 11:59 AM

## 2019-11-18 LAB — CBC WITH DIFFERENTIAL/PLATELET
Abs Immature Granulocytes: 0.03 10*3/uL (ref 0.00–0.07)
Basophils Absolute: 0.1 10*3/uL (ref 0.0–0.1)
Basophils Relative: 1 %
Eosinophils Absolute: 0.6 10*3/uL — ABNORMAL HIGH (ref 0.0–0.5)
Eosinophils Relative: 7 %
HCT: 24.9 % — ABNORMAL LOW (ref 39.0–52.0)
Hemoglobin: 7.3 g/dL — ABNORMAL LOW (ref 13.0–17.0)
Immature Granulocytes: 0 %
Lymphocytes Relative: 18 %
Lymphs Abs: 1.5 10*3/uL (ref 0.7–4.0)
MCH: 26.5 pg (ref 26.0–34.0)
MCHC: 29.3 g/dL — ABNORMAL LOW (ref 30.0–36.0)
MCV: 90.5 fL (ref 80.0–100.0)
Monocytes Absolute: 1.1 10*3/uL — ABNORMAL HIGH (ref 0.1–1.0)
Monocytes Relative: 13 %
Neutro Abs: 5.1 10*3/uL (ref 1.7–7.7)
Neutrophils Relative %: 61 %
Platelets: 167 10*3/uL (ref 150–400)
RBC: 2.75 MIL/uL — ABNORMAL LOW (ref 4.22–5.81)
RDW: 15.7 % — ABNORMAL HIGH (ref 11.5–15.5)
WBC: 8.5 10*3/uL (ref 4.0–10.5)
nRBC: 0 % (ref 0.0–0.2)

## 2019-11-18 LAB — HEMOGLOBIN AND HEMATOCRIT, BLOOD
HCT: 26 % — ABNORMAL LOW (ref 39.0–52.0)
Hemoglobin: 7.6 g/dL — ABNORMAL LOW (ref 13.0–17.0)

## 2019-11-18 LAB — COMPREHENSIVE METABOLIC PANEL
ALT: 18 U/L (ref 0–44)
AST: 21 U/L (ref 15–41)
Albumin: 2.9 g/dL — ABNORMAL LOW (ref 3.5–5.0)
Alkaline Phosphatase: 51 U/L (ref 38–126)
Anion gap: 6 (ref 5–15)
BUN: 24 mg/dL — ABNORMAL HIGH (ref 8–23)
CO2: 27 mmol/L (ref 22–32)
Calcium: 8.8 mg/dL — ABNORMAL LOW (ref 8.9–10.3)
Chloride: 112 mmol/L — ABNORMAL HIGH (ref 98–111)
Creatinine, Ser: 1.25 mg/dL — ABNORMAL HIGH (ref 0.61–1.24)
GFR calc Af Amer: 59 mL/min — ABNORMAL LOW (ref >60)
GFR calc non Af Amer: 51 mL/min — ABNORMAL LOW (ref >60)
Glucose, Bld: 95 mg/dL (ref 70–99)
Potassium: 3.9 mmol/L (ref 3.5–5.1)
Sodium: 145 mmol/L (ref 135–145)
Total Bilirubin: 0.6 mg/dL (ref 0.3–1.2)
Total Protein: 5.7 g/dL — ABNORMAL LOW (ref 6.5–8.1)

## 2019-11-18 LAB — URINALYSIS, ROUTINE W REFLEX MICROSCOPIC
Bilirubin Urine: NEGATIVE
Glucose, UA: NEGATIVE mg/dL
Hgb urine dipstick: NEGATIVE
Ketones, ur: NEGATIVE mg/dL
Leukocytes,Ua: NEGATIVE
Nitrite: NEGATIVE
Protein, ur: NEGATIVE mg/dL
Specific Gravity, Urine: 1.015 (ref 1.005–1.030)
pH: 7 (ref 5.0–8.0)

## 2019-11-18 LAB — PHOSPHORUS: Phosphorus: 3.4 mg/dL (ref 2.5–4.6)

## 2019-11-18 LAB — MAGNESIUM: Magnesium: 2 mg/dL (ref 1.7–2.4)

## 2019-11-18 MED ORDER — DIVALPROEX SODIUM 125 MG PO DR TAB
125.0000 mg | DELAYED_RELEASE_TABLET | Freq: Every day | ORAL | Status: DC
  Administered 2019-11-19: 125 mg via ORAL
  Filled 2019-11-18: qty 1

## 2019-11-18 MED ORDER — ONDANSETRON HCL 4 MG PO TABS
4.0000 mg | ORAL_TABLET | Freq: Three times a day (TID) | ORAL | Status: DC
  Administered 2019-11-18 – 2019-11-19 (×3): 4 mg via ORAL
  Filled 2019-11-18 (×4): qty 1

## 2019-11-18 NOTE — TOC Initial Note (Signed)
Transition of Care [facility]) - Initial/Assessment Note    Patient Details  Name: Christian Bennett MRN: [id number] Date of Birth: 1928-05-18  Transition of [facility]) CM/SW Contact:    Lia Hopping, [place] Phone Number: 11/18/2019, 8:59 AM  Clinical Narrative:                 Patient has hx of dementia. Patient discharged back to [facility] [place] on 2/10.   On 2/12, Per EMS,Pt is coming from [facility] on [address]. Pt was seen recently for N/V. Pt prescribed Zofran on discharge, per staff the meds have not been working. Pt sent out to be evaluated again.   TOC staff will continue to follow this patient for discharge needs.    Expected Discharge Plan: Memory Care Barriers to Discharge: Continued Medical Work up   Patient Goals and CMS Choice        Expected Discharge Plan and Services Expected Discharge Plan: Memory Care   Discharge Planning Services: CM Consult   Living arrangements for the past 2 months: [place]                                      Prior Living Arrangements/Services Living arrangements for the past 2 months: [place] Lives with:: Facility Resident Patient language and need for interpreter reviewed:: Yes        Need for Family Participation in Patient Care: Yes (Comment) Care giver support system in place?: Yes (comment)   Criminal Activity/Legal Involvement Pertinent to Current Situation/Hospitalization: No - Comment as needed  Activities of Daily Living Home Assistive Devices/Equipment: Walker (specify type) ADL Screening (condition at time of admission) Patient's cognitive ability adequate to safely complete daily activities?: No Is the patient deaf or have difficulty hearing?: Yes Does the patient have difficulty seeing, even when wearing glasses/contacts?: No Does the patient have difficulty concentrating, remembering, or making decisions?: Yes Patient able to express need for assistance  with ADLs?: No Does the patient have difficulty dressing or bathing?: Yes Independently performs ADLs?: No Does the patient have difficulty walking or climbing stairs?: Yes Weakness of Legs: Both Weakness of Arms/Hands: None  Permission Sought/Granted      Share Information with NAME: Gaye Alken     Permission granted to share info w Relationship: Daughter  Permission granted to share info w Contact Information: [phone]  Emotional Assessment Appearance:: Appears stated age     Orientation: : Oriented to Self Alcohol / Substance Use: Not Applicable Psych Involvement: No (comment)  Admission diagnosis:  Upper GI bleed [K92.2] Coffee ground emesis [K92.0] AKI (acute kidney injury) ([place]) [N17.9] Patient Active Problem List   Diagnosis Date Noted  . Esophagitis 11/16/2019  . AKI (acute kidney injury) ([place]) 11/16/2019  . CKD (chronic kidney disease), stage III 11/13/2019  . Upper GI bleed 11/12/2019  . GI bleeding 11/11/2019  . Cognitive dysfunction 10/25/2019  . History of CVA (cerebrovascular accident) 10/25/2019  . Anemia 10/25/2019  . Hyperlipidemia 09/26/2019  . Essential hypertension 09/26/2019  . TIA due to embolism ([place]) 09/26/2019  . H/O carotid endarterectomy 09/26/2019  . Carotid artery stenosis, symptomatic, right 07/25/2019  . CVA (cerebral vascular accident) ([place]) 07/11/2019  . TIA (transient ischemic attack) 07/10/2019  . Adjustment disorder with mixed anxiety and depressed mood 06/20/2018  . Vitamin D insufficiency 06/20/2018  . History of anemia 06/20/2018  . Medically noncompliant-   does  not want to go for hearing eval 06/20/2018  . Bilateral impacted cerumen 02/07/2018  . Hearing difficulty of both ears 02/03/2018  . Environmental and seasonal allergies 02/03/2018  . Eustachian tube dysfunction, bilateral 02/03/2018  . History of vitamin D deficiency 01/30/2018  . Contact dermatitis and eczema 10/19/2017  . HTN (hypertension) 06/22/2017  .  GERD (gastroesophageal reflux disease) 06/22/2017  . Hx of colonoscopy 06/22/2017  . Mood disorder ([place]) 06/22/2017  . Hypothyroidism 06/22/2017  . Chronic pancreatitis ([place]) 06/22/2017   PCP:  Mellody Dance, DO Pharmacy:   [facility] ([facility] [place] - [phone] [place] ([place]) [place] [address] Phone: [phone] Fax: [phone]     Social Determinants of Health (SDOH) Interventions    Readmission Risk Interventions No flowsheet data found.

## 2019-11-18 NOTE — Progress Notes (Signed)
PROGRESS NOTE    Christian Bennett  RJJ:[id number] DOB: 05/31/1928 DOA: 11/16/2019 PCP: Mellody Dance, DO   Brief Narrative:  Christian Bennett is Christian Bennett 86 y.o. male with medical history significant of Alzheimer's dementia with sundowning, hypertension, TIA, CKD stage III, hypothyroidism, carotid artery occlusion status post right CEA, GERD, chronic pancreatitis presenting to the ED from his nursing home for evaluation of coffee-ground emesis.  EMS reports facility staff he was still having nausea and vomiting and Zofran was not working.  Patient not sure why he is here.  Oriented to self only.  No history could be obtained from him. He was recently admitted 2/7-2/10 for acute blood loss anemia anemia secondary to upper GI bleed.  Underwent EGD which revealed grade D reflux esophagitis with no bleeding.  GI recommended PPI daily indefinitely.  Patient received 1 unit PRBCs during this hospitalization and hemoglobin was 9.3 at the time of discharge.  Baseline hemoglobin around 8.    Assessment & Plan:   Principal Problem:   Upper GI bleed Active Problems:   HTN (hypertension)   Hypothyroidism   Chronic pancreatitis (HCC)   TIA (transient ischemic attack)   Esophagitis   AKI (acute kidney injury) ([place])  Upper GI bleed/coffee-ground emesis in the setting of severe grade D reflux esophagitis Chest CT with findings consistent with diffuse thoracic esophagitis and no evidence of perforation.  FOBT positive.  No episodes of coffee-ground emesis since patient hospitalized in ED. Hemodynamically stable. - Hb slowly downtrending, but stable this afternoon at 7.6, continue to monitor - GI c/s, appreciate recs -> recommending BID PPI for 8 weeks, then decrease to once daily - head of bed to 30-45 degrees to minimize reflux - IV PPI twice daily -> when discharged, will discharge to SNF on zegerid  - follow on soft diet today, if does well with soft diet and stable hb, maybe able to be discharged 2/15 - IV  fluid hydration - IV Zofran scheduled - Type and screen - Monitor CBC, transfuse PRBCs if significant drop in hemoglobin  AKI on CKD stage III Likely prerenal due to dehydration in the setting of emesis.  Creatinine on presentation 2.  Baseline creatinine 1.1-1.3. - Improved to baseline -Continue to monitor renal function and urine output -Avoid nephrotoxic agents  Alzheimer's dementia with sundowning Patient gets agitated in the evenings and experiences sundowning. -seroquel, zoloft, resume PO depakote - sitter for agitation, will try to d/c today if possible  Leukocytosis Possibly reactive.  WBC count 16.4 on presentation.  Afebrile.  Chest imaging without evidence of pneumonia. -Check UA (pending) -Continue to monitor WBC count - improving  Mild troponin elevation Likely secondary to demand ischemia.  No complaints of chest pain. Initial high-sensitivity troponin 21, repeat 22.  EKG without acute ischemic changes.  Not consistent with ACS.  Hypertension - amlodipine, BP reasonable today  Hypothyroidism -continue PO thyroid meds -TSH wnl  Carotid artery occlusion status post CEA -Hold aspirin at this time given active GI bleed  History of TIA -Hold aspirin at this time given active GI bleed -Resume Lipitor when no longer n.p.o.  Chronic pancreatitis -Resume home Creon   DVT prophylaxis: SCD Code Status: DNR Family Communication: none at bedside - called daughter, 2/14 Disposition Plan:  . Patient came from: Nursing home           . Anticipated d/c place: home . Barriers to d/c OR conditions which need to be met to effect Raenah Murley safe d/c: improvement in N/V and hematemesis Consultants:  GI  Procedures:   none  Antimicrobials:  Anti-infectives (From admission, onward)   None     Subjective: No complaints Continues to be confused, but pleasant Denies abdominal pain  Objective: Vitals:   11/18/19 0530 11/18/19 0802 11/18/19 1208 11/18/19 1209  BP:  (!) 145/73 129/62 (!) 150/123 124/69  Pulse: 68 78 78   Resp: 18     Temp: 98.2 F (36.8 C) 98.2 F (36.8 C) 98.2 F (36.8 C)   TempSrc: Oral Oral Oral   SpO2: 94% 95% 96%   Weight:      Height:        Intake/Output Summary (Last 24 hours) at 11/18/2019 1614 Last data filed at 11/18/2019 1245 Gross per 24 hour  Intake 440 ml  Output 425 ml  Net 15 ml   Filed Weights   11/17/19 1624  Weight: 70.5 kg    Examination:  General: No acute distress. Cardiovascular: Heart sounds show Yashas Camilli regular rate, and rhythm.  Lungs: Clear to auscultation bilaterally  Abdomen: Soft, nontender, nondistended Neurological: Alert and disoriented. Moves all extremities 4. Cranial nerves II through XII grossly intact. Skin: Warm and dry. No rashes or lesions. Extremities: No clubbing or cyanosis. No edema.    Data Reviewed: I have personally reviewed following labs and imaging studies  CBC: Recent Labs  Lab 11/13/19 0546 11/13/19 0546 11/14/19 0504 11/14/19 1510 11/16/19 1906 11/16/19 1906 11/17/19 0151 11/17/19 1654 11/17/19 2248 11/18/19 0409 11/18/19 1248  WBC 11.5*  --  8.7  --  16.4*  --  13.4*  --   --  8.5  --   NEUTROABS  --   --   --   --  13.7*  --   --   --   --  5.1  --   HGB 8.1*   < > 7.8*   < > 9.2*   < > 8.6* 8.0* 7.9* 7.3* 7.6*  HCT 27.1*   < > 26.6*   < > 30.1*   < > 28.4* 26.8* 26.5* 24.9* 26.0*  MCV 88.0  --  87.5  --  86.7  --  87.9  --   --  90.5  --   PLT 190  --  176  --  197  --  189  --   --  167  --    < > = values in this interval not displayed.   Basic Metabolic Panel: Recent Labs  Lab 11/13/19 0546 11/14/19 0504 11/16/19 1906 11/17/19 0151 11/18/19 0409  NA 140 138 144 145 145  K 3.5 3.4* 4.0 3.9 3.9  CL 106 105 104 108 112*  CO2 '27 27 29 30 27  ' GLUCOSE 94 96 136* 124* 95  BUN 23 22 47* 42* 24*  CREATININE 1.34* 1.19 2.04* 1.70* 1.25*  CALCIUM 9.3 9.1 9.4 8.9 8.8*  MG  --   --   --   --  2.0  PHOS  --   --   --   --  3.4   GFR: Estimated  Creatinine Clearance: 40 mL/min (Winefred [place]) (by C-G formula based on SCr of 1.25 mg/dL (H)). Liver Function Tests: Recent Labs  Lab 11/16/19 1906 11/18/19 0409  AST 31 21  ALT 24 18  ALKPHOS 69 51  BILITOT 0.5 0.6  PROT 7.2 5.7*  ALBUMIN 3.8 2.9*   Recent Labs  Lab 11/16/19 1906  LIPASE 19   No results for input(s): AMMONIA in the last 168 hours. Coagulation Profile: No results for  input(s): INR, PROTIME in the last 168 hours. Cardiac Enzymes: No results for input(s): CKTOTAL, CKMB, CKMBINDEX, TROPONINI in the last 168 hours. BNP (last 3 results) No results for input(s): PROBNP in the last 8760 hours. HbA1C: No results for input(s): HGBA1C in the last 72 hours. CBG: No results for input(s): GLUCAP in the last 168 hours. Lipid Profile: No results for input(s): CHOL, HDL, LDLCALC, TRIG, CHOLHDL, LDLDIRECT in the last 72 hours. Thyroid Function Tests: Recent Labs    11/17/19 0151  TSH 1.190   Anemia Panel: No results for input(s): VITAMINB12, FOLATE, FERRITIN, TIBC, IRON, RETICCTPCT in the last 72 hours. Sepsis Labs: No results for input(s): PROCALCITON, LATICACIDVEN in the last 168 hours.  Recent Results (from the past 240 hour(s))  Respiratory Panel by RT PCR (Flu Leala Bryand&B, Covid) - Nasopharyngeal Swab     Status: None   Collection Time: 11/11/19  1:45 PM   Specimen: Nasopharyngeal Swab  Result Value Ref Range Status   SARS Coronavirus 2 by RT PCR NEGATIVE NEGATIVE Final    Comment: (NOTE) SARS-CoV-2 target nucleic acids are NOT DETECTED. The SARS-CoV-2 RNA is generally detectable in upper respiratoy specimens during the acute phase of infection. The lowest concentration of SARS-CoV-2 viral copies this assay can detect is 131 copies/mL. Elsbeth Yearick negative result does not preclude SARS-Cov-2 infection and should not be used as the sole basis for treatment or other patient management decisions. Clariece Roesler negative result may occur with  improper specimen collection/handling, submission of  specimen other than nasopharyngeal swab, presence of viral mutation(s) within the areas targeted by this assay, and inadequate number of viral copies (<131 copies/mL). Jammy Plotkin negative result must be combined with clinical observations, patient history, and epidemiological information. The expected result is Negative. Fact Sheet for Patients:  PinkCheek.be Fact Sheet for Healthcare Providers:  GravelBags.it This test is not yet ap proved or cleared by the [place] FDA and  has been authorized for detection and/or diagnosis of SARS-CoV-2 by FDA under an Emergency Use Authorization (EUA). This EUA will remain  in effect (meaning this test can be used) for the duration of the COVID-19 declaration under Section 564(b)(1) of the Act, 21 U.S.C. section 360bbb-3(b)(1), unless the authorization is terminated or revoked sooner.    Influenza Makhari Dovidio by PCR NEGATIVE NEGATIVE Final   Influenza B by PCR NEGATIVE NEGATIVE Final    Comment: (NOTE) The Xpert Xpress SARS-CoV-2/FLU/RSV assay is intended as an aid in  the diagnosis of influenza from Nasopharyngeal swab specimens and  should not be used as Danyiel Crespin sole basis for treatment. Nasal washings and  aspirates are unacceptable for Xpert Xpress SARS-CoV-2/FLU/RSV  testing. Fact Sheet for Patients: PinkCheek.be Fact Sheet for Healthcare Providers: GravelBags.it This test is not yet approved or cleared by the [place] FDA and  has been authorized for detection and/or diagnosis of SARS-CoV-2 by  FDA under an Emergency Use Authorization (EUA). This EUA will remain  in effect (meaning this test can be used) for the duration of the  Covid-19 declaration under Section 564(b)(1) of the Act, 21  U.S.C. section 360bbb-3(b)(1), unless the authorization is  terminated or revoked. Performed at [facility], [place] [address], [place], [place] [address]   MRSA PCR Screening     Status: None   Collection Time: 11/11/19  7:43 PM   Specimen: Nasal Mucosa; Nasopharyngeal  Result Value Ref Range Status   MRSA by PCR NEGATIVE NEGATIVE Final    Comment:  The GeneXpert MRSA Assay (FDA approved for NASAL specimens only), is one component of Daianna Vasques comprehensive MRSA colonization surveillance program. It is not intended to diagnose MRSA infection nor to guide or monitor treatment for MRSA infections. Performed at [facility], [place] [address], [place], [place] [address]   Respiratory Panel by RT PCR (Flu Stillman Buenger&B, Covid) - Nasopharyngeal Swab     Status: None   Collection Time: 11/16/19 10:47 PM   Specimen: Nasopharyngeal Swab  Result Value Ref Range Status   SARS Coronavirus 2 by RT PCR NEGATIVE NEGATIVE Final    Comment: (NOTE) SARS-CoV-2 target nucleic acids are NOT DETECTED. The SARS-CoV-2 RNA is generally detectable in upper respiratoy specimens during the acute phase of infection. The lowest concentration of SARS-CoV-2 viral copies this assay can detect is 131 copies/mL. Kinser Fellman negative result does not preclude SARS-Cov-2 infection and should not be used as the sole basis for treatment or other patient management decisions. Lus Kriegel negative result may occur with  improper specimen collection/handling, submission of specimen other than nasopharyngeal swab, presence of viral mutation(s) within the areas targeted by this assay, and inadequate number of viral copies (<131 copies/mL). Berlynn Warsame negative result must be combined with clinical observations, patient history, and epidemiological information. The expected result is Negative. Fact Sheet for Patients:  PinkCheek.be Fact Sheet for Healthcare Providers:  GravelBags.it This test is not yet ap proved or cleared by the [place] FDA and  has been authorized for detection and/or diagnosis of  SARS-CoV-2 by FDA under an Emergency Use Authorization (EUA). This EUA will remain  in effect (meaning this test can be used) for the duration of the COVID-19 declaration under Section 564(b)(1) of the Act, 21 U.S.C. section 360bbb-3(b)(1), unless the authorization is terminated or revoked sooner.    Influenza Godwin Tedesco by PCR NEGATIVE NEGATIVE Final   Influenza B by PCR NEGATIVE NEGATIVE Final    Comment: (NOTE) The Xpert Xpress SARS-CoV-2/FLU/RSV assay is intended as an aid in  the diagnosis of influenza from Nasopharyngeal swab specimens and  should not be used as Arber Wiemers sole basis for treatment. Nasal washings and  aspirates are unacceptable for Xpert Xpress SARS-CoV-2/FLU/RSV  testing. Fact Sheet for Patients: PinkCheek.be Fact Sheet for Healthcare Providers: GravelBags.it This test is not yet approved or cleared by the [place] FDA and  has been authorized for detection and/or diagnosis of SARS-CoV-2 by  FDA under an Emergency Use Authorization (EUA). This EUA will remain  in effect (meaning this test can be used) for the duration of the  Covid-19 declaration under Section 564(b)(1) of the Act, 21  U.S.C. section 360bbb-3(b)(1), unless the authorization is  terminated or revoked. Performed at [facility], [place] [address], [place], [place] [address]   MRSA PCR Screening     Status: None   Collection Time: 11/17/19 12:58 AM   Specimen: Nasopharyngeal  Result Value Ref Range Status   MRSA by PCR NEGATIVE NEGATIVE Final    Comment:        The GeneXpert MRSA Assay (FDA approved for NASAL specimens only), is one component of Kendria Halberg comprehensive MRSA colonization surveillance program. It is not intended to diagnose MRSA infection nor to guide or monitor treatment for MRSA infections. Performed at [facility], [place] [address], [place],  [address]          Radiology Studies: CT  ABDOMEN PELVIS WO CONTRAST  Result Date: 11/16/2019 CLINICAL DATA:  Nausea and vomiting EXAM: CT ABDOMEN AND PELVIS WITHOUT  CONTRAST TECHNIQUE: Multidetector CT imaging of the abdomen and pelvis was performed following the standard protocol without IV contrast. COMPARISON:  CT Feb 26, 2016 FINDINGS: Lower chest: Hypoattenuation the cardiac blood pool relative the myocardium is suggestive of anemia. Cardiac size upper limits normal. Extensive coronary artery calcifications are present. No pericardial effusion. Streaky and bandlike opacities in the lung bases likely reflect areas of atelectasis and/or scarring. No consolidative opacity. Small right pleural effusion. Hepatobiliary: Few fluid attenuation cysts in the liver are again noted, better seen on comparison contrast-enhanced CT from 2017. The largest is Liana Camerer 1.5 cm cyst in the anterior left lobe liver, essentially unchanged in size and appearance from comparison. Eveleen Mcnear previously seen fluid attenuation cyst along the falciform ligament is no longer present and may reflect remote interval cyst rupture or collapse. No new worrisome hepatic lesions. Smooth liver surface contour. Calcified gallstone layers dependently within the gallbladder. No gallbladder wall thickening, pericholecystic fluid or biliary ductal dilatation. No calcified intraductal gallstones. Pancreas: Punctate calculus the head of the pancreas (2/28) similar to prior and could reflect sequela of prior inflammation. No pancreatic ductal dilatation or surrounding inflammatory changes. Spleen: Normal in size without focal abnormality. Adrenals/Urinary Tract: Normal adrenal glands. No worrisome adrenal lesions. Several fluid attenuation cysts present both kidneys including Kamylle Axelson larger 2.2 cm cyst in the posterior right upper pole. No worrisome renal lesions. No urolithiasis or hydronephrosis. Mild bilateral symmetric perinephric stranding is similar to prior, Tito Ausmus nonspecific finding though may correlate with  either age or decreased renal function. Mild thickening of the urinary bladder. Stomach/Bowel: There is circumferential mural thickening of the distal thoracic esophagus with Christyanna Mckeon moderate hiatal hernia and Daiya Tamer small amount of distal paraesophageal stranding and trace fluid (2/7) distal stomach is unremarkable. Duodenal sweep takes Tyjai Charbonnet normal course. No small bowel dilatation or wall thickening. Donabelle Molden normal appendix is visualized. No colonic dilatation or wall thickening. Scattered colonic diverticula without focal pericolonic inflammation to suggest diverticulitis. Vascular/Lymphatic: Extensive atherosclerotic plaque throughout the aorta and abdominal branch vessels. Luminal evaluation is precluded in the absence of contrast. No suspicious or enlarged lymph nodes in the included lymphatic chains. Reproductive: The prostate and seminal vesicles are unremarkable. Other: Question small amount of fluid and stranding adjacent the distal thoracic esophagus, as above. No abdominopelvic free air or fluid. No bowel containing hernias. Musculoskeletal: Mild levoconvex curvature of the lumbar spine with an apex at L3. Multilevel degenerative changes are present in the imaged portions of the spine. Interspinous arthrosis compatible with Baastrup's disease is noted. Additional degenerative changes noted in both hips and SI joints. No acute osseous abnormality or suspicious osseous lesion. IMPRESSION: 1. There is circumferential mural thickening of the distal thoracic esophagus with Daryl Quiros moderate hiatal hernia and Charli Halle small amount of distal paraesophageal stranding and trace fluid. Findings are compatible with reported history of esophagitis though mural integrity is not fully assessed on this exam. Seerat Peaden CT of the chest could be obtained to further characterize the extent of inflammation, otherwise consider GI consultation and evaluation with endoscopy. 2. Small right pleural effusion. 3. Cholelithiasis without CT evidence of acute cholecystitis.  4.  Aortic Atherosclerosis (ICD10-I70.0). These results were called by telephone at the time of interpretation on 11/16/2019 at 9:27 pm to provider [facility] , who verbally acknowledged these results. Electronically Signed   By: Lovena Le M.D.   On: 11/16/2019 21:27   CT Chest Wo Contrast  Result Date: 11/16/2019 CLINICAL DATA:  Nausea and vomiting. Abnormal thoracic esophagus noted at CT. EXAM:  CT CHEST WITHOUT CONTRAST TECHNIQUE: Multidetector CT imaging of the chest was performed following the standard protocol without IV contrast. COMPARISON:  CT abdomen same day FINDINGS: Cardiovascular: Mild cardiac enlargement. Extensive coronary artery calcification. Aortic atherosclerotic calcification. Mediastinum/Nodes: No evidence of mediastinal lymphadenopathy. Majority of the esophagus is either thick-walled or full of isodense fluid. Findings could be seen with diffuse esophagitis. I do not see evidence of Filemon Breton perforation. Lungs/Pleura: Benign appearing pleural and parenchymal scarring at both lung apices. Small amount of pleural fluid on the right layering dependently. Minimal bilateral dependent atelectasis. Mild linear scarring at the left lung base. No sign of mass or active infiltrate. Upper Abdomen: Negative except for previously seen liver cyst completely described at abdominal CT. Musculoskeletal: Ankylosis through the thoracic region. No acute finding. IMPRESSION: Esophagus appears diffusely thick walled or full of isodense fluid. Findings could be consistent with diffuse thoracic esophagitis. I do not see evidence of Tyrin Herbers focal mass lesion. No evidence perforation. Aortic atherosclerosis.  Coronary artery calcification. Electronically Signed   By: Nelson Chimes M.D.   On: 11/16/2019 21:58        Scheduled Meds: . amLODipine  5 mg Oral Daily  . atorvastatin  40 mg Oral q1800  . Chlorhexidine Gluconate Cloth  6 each Topical Daily  . lipase/protease/amylase  36,000 Units Oral TID WC  . mouth  rinse  15 mL Mouth Rinse BID  . ondansetron (ZOFRAN) IV  4 mg Intravenous Q8H  . pantoprazole (PROTONIX) IV  40 mg Intravenous Q12H  . QUEtiapine  12.5 mg Oral QHS  . sertraline  25 mg Oral Daily  . thyroid  60 mg Oral QAC breakfast   Continuous Infusions: . sodium chloride 1,000 mL (11/18/19 1458)  . valproate sodium 125 mg (11/18/19 1245)     LOS: 2 days    Time spent: over 30 min    Fayrene Helper, MD [facility]   To contact the attending provider between 7A-7P or the covering provider during after hours 7P-7A, please log into the web site [email or web address] and access using universal [place] password for that web site. If you do not have the password, please call the hospital operator.  11/18/2019, 4:14 PM

## 2019-11-18 NOTE — Progress Notes (Signed)
Urinalysis collected and sent to lab.

## 2019-11-19 LAB — CBC WITH DIFFERENTIAL/PLATELET
Abs Immature Granulocytes: 0.03 10*3/uL (ref 0.00–0.07)
Basophils Absolute: 0.1 10*3/uL (ref 0.0–0.1)
Basophils Relative: 1 %
Eosinophils Absolute: 0.6 10*3/uL — ABNORMAL HIGH (ref 0.0–0.5)
Eosinophils Relative: 7 %
HCT: 25.6 % — ABNORMAL LOW (ref 39.0–52.0)
Hemoglobin: 7.6 g/dL — ABNORMAL LOW (ref 13.0–17.0)
Immature Granulocytes: 0 %
Lymphocytes Relative: 18 %
Lymphs Abs: 1.5 10*3/uL (ref 0.7–4.0)
MCH: 26.5 pg (ref 26.0–34.0)
MCHC: 29.7 g/dL — ABNORMAL LOW (ref 30.0–36.0)
MCV: 89.2 fL (ref 80.0–100.0)
Monocytes Absolute: 1.1 10*3/uL — ABNORMAL HIGH (ref 0.1–1.0)
Monocytes Relative: 13 %
Neutro Abs: 5.3 10*3/uL (ref 1.7–7.7)
Neutrophils Relative %: 61 %
Platelets: 192 10*3/uL (ref 150–400)
RBC: 2.87 MIL/uL — ABNORMAL LOW (ref 4.22–5.81)
RDW: 15.7 % — ABNORMAL HIGH (ref 11.5–15.5)
WBC: 8.6 10*3/uL (ref 4.0–10.5)
nRBC: 0 % (ref 0.0–0.2)

## 2019-11-19 LAB — COMPREHENSIVE METABOLIC PANEL
ALT: 17 U/L (ref 0–44)
AST: 24 U/L (ref 15–41)
Albumin: 3 g/dL — ABNORMAL LOW (ref 3.5–5.0)
Alkaline Phosphatase: 56 U/L (ref 38–126)
Anion gap: 7 (ref 5–15)
BUN: 20 mg/dL (ref 8–23)
CO2: 27 mmol/L (ref 22–32)
Calcium: 8.8 mg/dL — ABNORMAL LOW (ref 8.9–10.3)
Chloride: 107 mmol/L (ref 98–111)
Creatinine, Ser: 1.34 mg/dL — ABNORMAL HIGH (ref 0.61–1.24)
GFR calc Af Amer: 54 mL/min — ABNORMAL LOW (ref >60)
GFR calc non Af Amer: 47 mL/min — ABNORMAL LOW (ref >60)
Glucose, Bld: 99 mg/dL (ref 70–99)
Potassium: 4.3 mmol/L (ref 3.5–5.1)
Sodium: 141 mmol/L (ref 135–145)
Total Bilirubin: 0.7 mg/dL (ref 0.3–1.2)
Total Protein: 6.1 g/dL — ABNORMAL LOW (ref 6.5–8.1)

## 2019-11-19 LAB — MAGNESIUM: Magnesium: 1.9 mg/dL (ref 1.7–2.4)

## 2019-11-19 LAB — PHOSPHORUS: Phosphorus: 3.2 mg/dL (ref 2.5–4.6)

## 2019-11-19 MED ORDER — HALOPERIDOL LACTATE 5 MG/ML IJ SOLN
5.0000 mg | Freq: Once | INTRAMUSCULAR | Status: AC
  Administered 2019-11-19: 5 mg via INTRAVENOUS
  Filled 2019-11-19: qty 1

## 2019-11-19 MED ORDER — OMEPRAZOLE-SODIUM BICARBONATE 40-1100 MG PO CAPS: 1.0000 | ORAL_CAPSULE | Freq: Two times a day (BID) | ORAL | 1 refills | Status: DC

## 2019-11-19 NOTE — Discharge Summary (Signed)
Physician Discharge Summary  Christian Bennett [id number] DOB: Sep 29, 1928 DOA: 11/16/2019  PCP: Mellody Dance, DO  Admit date: 11/16/2019 Discharge date: 11/19/2019  Time spent: 40 minutes  Recommendations for Outpatient Follow-up:  1. Follow outpatient CBC/CMP 2. Discharged on zegerid BID -> needs PPI BID x2 months, then daily indefinitely 3. Continue soft diet, follow outpatient, if tolerating this well, can advance further as tolerated 4. Follow up with Dr. Benson Norway outpatient 5. Holding aspirin on discharge, follow with PCP/GI for when this can be resumed 6. Keep head of bed elevated to 30-45 degrees to minimize reflux  Discharge Diagnoses:  Principal Problem:   Upper GI bleed Active Problems:   HTN (hypertension)   Hypothyroidism   Chronic pancreatitis (HCC)   TIA (transient ischemic attack)   Esophagitis   AKI (acute kidney injury) ([place])  Discharge Condition: stable  Diet recommendation: soft diet  Filed Weights   11/17/19 1624  Weight: 70.5 kg   History of present illness:  Christian Bennett 86 y.o.malewith medical history significant ofAlzheimer's dementia with sundowning, hypertension, TIA, CKD stage III, hypothyroidism, carotid artery occlusion status post right CEA, GERD, chronic pancreatitis presenting to the ED from his nursing home for evaluation of coffee-ground emesis. EMS reports facility staff he was still having nausea and vomiting and Zofran was not working. Patient not sure why he is here.Oriented to self only.No history could be obtained from him. Hewas recently admitted 2/7-2/10 for acute blood loss anemia anemia secondary to upper GI bleed. Underwent EGD which revealed grade D reflux esophagitis with no bleeding. GI recommended PPI daily indefinitely. Patient received 1 unit PRBCs during this hospitalization and hemoglobin was 9.3 at the time of discharge. Baseline hemoglobin around 8.   He was admitted for nausea, vomiting, and coffee  ground emesis.  He had recent EGD with grade D esophagitis.  GI was consulted and recommended BID PPI for 2 months, then daily indefinitely.  Felt stable for discharge on 2/15.    Hospital Course:  Upper GI bleed/coffee-ground emesis in the setting of severe grade D reflux esophagitis Chest CT with findings consistent with diffuse thoracic esophagitis and no evidence of perforation. FOBT positive. No episodes of coffee-ground emesis since patient hospitalized in ED. Hemodynamically stable.  - Hb stable to 7.6 -> follow outpatient - GI c/s, appreciate recs -> recommending BID PPI for 8 weeks, then decrease to once daily - head of bed to 30-45 degrees to minimize reflux - will discharge on zegerid - continue soft diet on discharge, advance as tolerated  AKI on CKD stage III Likely prerenal due to dehydration in the setting of emesis.Creatinine on presentation 2. Baseline creatinine 1.1-1.3. - Improved to baseline, follow outpatient -Continue to monitor renal function and urine output -Avoid nephrotoxic agents  Alzheimer's dementia with sundowning Patient gets agitated in the evenings and experiences sundowning. -seroquel, zoloft, resume PO depakote  Leukocytosis Possibly reactive. WBC count 16.4 on presentation. Afebrile.Chest imaging without evidence of pneumonia. -Check UA (pending) -Continue to monitor WBC count - improving  Mild troponin elevation Likely secondary to demand ischemia. No complaints of chest pain. Initial high-sensitivity troponin21, repeat 22.EKG without acute ischemic changes.  Not consistent with ACS.  Hypertension - amlodipine  Hypothyroidism -continue PO thyroid meds -TSH wnl  Carotid artery occlusion status post CEA -Hold aspirin at this time given active GI bleed (discussed with vascular who noted ok to hold)  History of TIA -Hold aspirin - follow with PCP and GI for when to resume -Resume Lipitor  Chronic pancreatitis -Resume  home Creon   Procedures:  none  Consultations:  GI  Discharge Exam: Vitals:   11/18/19 2227 11/19/19 0558  BP: (!) 144/62 (!) 160/71  Pulse: 66 62  Resp: 17 17  Temp: 98 F (36.7 C) 97.8 F (36.6 C)  SpO2: (!) 84% 99%   Pleasantly confused No complaints Discussed discharge plan with daughter  General: No acute distress. Cardiovascular: Heart sounds show Annjeanette Sarwar regular rate, and rhythm Lungs: Clear to auscultation bilaterally  Abdomen: Soft, nontender, nondistended Neurological: Alert and oriented 3. Moves all extremities 4 . Cranial nerves II through XII grossly intact. Skin: Warm and dry. No rashes or lesions. Extremities: No clubbing or cyanosis. No edema.   Discharge Instructions   Discharge Instructions    Call MD for:  difficulty breathing, headache or visual disturbances   Complete by: As directed    Call MD for:  extreme fatigue   Complete by: As directed    Call MD for:  hives   Complete by: As directed    Call MD for:  persistant dizziness or light-headedness   Complete by: As directed    Call MD for:  persistant nausea and vomiting   Complete by: As directed    Call MD for:  redness, tenderness, or signs of infection (pain, swelling, redness, odor or green/yellow discharge around incision site)   Complete by: As directed    Call MD for:  severe uncontrolled pain   Complete by: As directed    Call MD for:  temperature >100.4   Complete by: As directed    Diet - low sodium heart healthy   Complete by: As directed    Diet - low sodium heart healthy   Complete by: As directed    Discharge instructions   Complete by: As directed    You were seen for recurrent nausea/vomiting and coffee ground emesis.  This has improved with IV protonix and antiemetics.   You will go home with twice daily zegerid for the next 2 months, then transition to once daily indefinitely.  Hold your aspirin until you're told you can resume this by your PCP or Dr.  Benson Norway.  Return for new, recurrent, or worsening symptoms.  Please ask your PCP to request records from this hospitalization so they know what was done and what the next steps will be.   Increase activity slowly   Complete by: As directed    Increase activity slowly   Complete by: As directed      Allergies as of 11/19/2019      Reactions   Doxycycline Hives   Eggs Or Egg-derived Products    UNSPECIFIED REACTION  >> "Sick"      Medication List    STOP taking these medications   aspirin 81 MG EC tablet   pantoprazole 40 MG tablet Commonly known as: PROTONIX     TAKE these medications   amLODipine 5 MG tablet Commonly known as: NORVASC Take 1 tablet (5 mg total) by mouth daily.   atorvastatin 40 MG tablet Commonly known as: LIPITOR Take 1 tablet (40 mg total) by mouth daily at 6 PM. No more rfs-->  Future Refills per long term care facility pt is entering jan 25-27, 2021   Creon 36000 UNITS Cpep capsule Generic drug: lipase/protease/amylase Take 36,000 Units by mouth 3 (three) times daily with meals.   divalproex 125 MG DR tablet Commonly known as: Depakote Take 1 tablet (125 mg total) by mouth daily.  eucerin cream APPLY TWICE DAILY AS NEEDED FOR RASH OR ITCHING What changed: See the new instructions.   fluticasone 50 MCG/ACT nasal spray Commonly known as: FLONASE Place 2 sprays into both nostrils daily as needed for allergies or rhinitis.   iron polysaccharides 150 MG capsule Commonly known as: NIFEREX Take 1 capsule (150 mg total) by mouth daily.   magnesium oxide 400 (241.3 Mg) MG tablet Commonly known as: MAG-OX Take 1 tablet (400 mg total) by mouth daily.   omeprazole-sodium bicarbonate 40-1100 MG capsule Commonly known as: Zegerid Take 1 capsule by mouth 2 (two) times daily before Camrin Lapre meal. Transition to once daily after 1 month   QUEtiapine 25 MG tablet Commonly known as: SEROQUEL Take 0.5 tablets (12.5 mg total) by mouth at bedtime.   senna 8.6  MG Tabs tablet Commonly known as: SENOKOT Take 1 tablet (8.6 mg total) by mouth 2 (two) times daily.   sertraline 25 MG tablet Commonly known as: ZOLOFT Take 1 tablet (25 mg total) by mouth daily. No more rfs-->  Future Refills per long term care facility pt is entering jan 25-27, 2021   thyroid 60 MG tablet Commonly known as: NP Thyroid TAKE 1 TABLET BY MOUTH ONCE DAILY BEFORE BREAKFAST  No more rfs-->  Future Refills per long term care facility pt is entering jan 25-27, 2021 What changed:   how much to take  how to take this  when to take this  additional instructions   triamcinolone cream 0.1 % Commonly known as: KENALOG Apply 1 application topically as needed (rash).   Vitamin D (Ergocalciferol) 1.25 MG (50000 UNIT) Caps capsule Commonly known as: DRISDOL Take 1 capsule (50,000 Units total) by mouth every 7 (seven) days. No more rfs-->  Future Refills per long term care facility pt is entering jan 25-27, 2021      Allergies  Allergen Reactions  . Doxycycline Hives  . Eggs Or Egg-Derived Products     UNSPECIFIED REACTION  >> "Sick"   Contact information for after-discharge care    Destination    [facility] .   Service: Assisted Living Contact information: [address] [place] [phone]               The results of significant diagnostics from this hospitalization (including imaging, microbiology, ancillary and laboratory) are listed below for reference.    Significant Diagnostic Studies: CT ABDOMEN PELVIS WO CONTRAST  Result Date: 11/16/2019 CLINICAL DATA:  Nausea and vomiting EXAM: CT ABDOMEN AND PELVIS WITHOUT CONTRAST TECHNIQUE: Multidetector CT imaging of the abdomen and pelvis was performed following the standard protocol without IV contrast. COMPARISON:  CT Feb 26, 2016 FINDINGS: Lower chest: Hypoattenuation the cardiac blood pool relative the myocardium is suggestive of anemia. Cardiac size upper  limits normal. Extensive coronary artery calcifications are present. No pericardial effusion. Streaky and bandlike opacities in the lung bases likely reflect areas of atelectasis and/or scarring. No consolidative opacity. Small right pleural effusion. Hepatobiliary: Few fluid attenuation cysts in the liver are again noted, better seen on comparison contrast-enhanced CT from 2017. The largest is Elan Brainerd 1.5 cm cyst in the anterior left lobe liver, essentially unchanged in size and appearance from comparison. Toma Arts previously seen fluid attenuation cyst along the falciform ligament is no longer present and may reflect remote interval cyst rupture or collapse. No new worrisome hepatic lesions. Smooth liver surface contour. Calcified gallstone layers dependently within the gallbladder. No gallbladder wall thickening, pericholecystic fluid or biliary ductal  dilatation. No calcified intraductal gallstones. Pancreas: Punctate calculus the head of the pancreas (2/28) similar to prior and could reflect sequela of prior inflammation. No pancreatic ductal dilatation or surrounding inflammatory changes. Spleen: Normal in size without focal abnormality. Adrenals/Urinary Tract: Normal adrenal glands. No worrisome adrenal lesions. Several fluid attenuation cysts present both kidneys including Keno Caraway larger 2.2 cm cyst in the posterior right upper pole. No worrisome renal lesions. No urolithiasis or hydronephrosis. Mild bilateral symmetric perinephric stranding is similar to prior, Tarique Loveall nonspecific finding though may correlate with either age or decreased renal function. Mild thickening of the urinary bladder. Stomach/Bowel: There is circumferential mural thickening of the distal thoracic esophagus with Risa Auman moderate hiatal hernia and Barnes Florek small amount of distal paraesophageal stranding and trace fluid (2/7) distal stomach is unremarkable. Duodenal sweep takes Lesha Jager normal course. No small bowel dilatation or wall thickening. Jamyria Ozanich normal appendix is visualized.  No colonic dilatation or wall thickening. Scattered colonic diverticula without focal pericolonic inflammation to suggest diverticulitis. Vascular/Lymphatic: Extensive atherosclerotic plaque throughout the aorta and abdominal branch vessels. Luminal evaluation is precluded in the absence of contrast. No suspicious or enlarged lymph nodes in the included lymphatic chains. Reproductive: The prostate and seminal vesicles are unremarkable. Other: Question small amount of fluid and stranding adjacent the distal thoracic esophagus, as above. No abdominopelvic free air or fluid. No bowel containing hernias. Musculoskeletal: Mild levoconvex curvature of the lumbar spine with an apex at L3. Multilevel degenerative changes are present in the imaged portions of the spine. Interspinous arthrosis compatible with Baastrup's disease is noted. Additional degenerative changes noted in both hips and SI joints. No acute osseous abnormality or suspicious osseous lesion. IMPRESSION: 1. There is circumferential mural thickening of the distal thoracic esophagus with Tu Shimmel moderate hiatal hernia and Njeri Vicente small amount of distal paraesophageal stranding and trace fluid. Findings are compatible with reported history of esophagitis though mural integrity is not fully assessed on this exam. Zia Najera CT of the chest could be obtained to further characterize the extent of inflammation, otherwise consider GI consultation and evaluation with endoscopy. 2. Small right pleural effusion. 3. Cholelithiasis without CT evidence of acute cholecystitis. 4.  Aortic Atherosclerosis (ICD10-I70.0). These results were called by telephone at the time of interpretation on 11/16/2019 at 9:27 pm to provider [facility] , who verbally acknowledged these results. Electronically Signed   By: Lovena Le M.D.   On: 11/16/2019 21:27   CT Chest Wo Contrast  Result Date: 11/16/2019 CLINICAL DATA:  Nausea and vomiting. Abnormal thoracic esophagus noted at CT. EXAM: CT CHEST  WITHOUT CONTRAST TECHNIQUE: Multidetector CT imaging of the chest was performed following the standard protocol without IV contrast. COMPARISON:  CT abdomen same day FINDINGS: Cardiovascular: Mild cardiac enlargement. Extensive coronary artery calcification. Aortic atherosclerotic calcification. Mediastinum/Nodes: No evidence of mediastinal lymphadenopathy. Majority of the esophagus is either thick-walled or full of isodense fluid. Findings could be seen with diffuse esophagitis. I do not see evidence of Jilliana Burkes perforation. Lungs/Pleura: Benign appearing pleural and parenchymal scarring at both lung apices. Small amount of pleural fluid on the right layering dependently. Minimal bilateral dependent atelectasis. Mild linear scarring at the left lung base. No sign of mass or active infiltrate. Upper Abdomen: Negative except for previously seen liver cyst completely described at abdominal CT. Musculoskeletal: Ankylosis through the thoracic region. No acute finding. IMPRESSION: Esophagus appears diffusely thick walled or full of isodense fluid. Findings could be consistent with diffuse thoracic esophagitis. I do not see evidence of Ladaisha Portillo focal mass lesion. No  evidence perforation. Aortic atherosclerosis.  Coronary artery calcification. Electronically Signed   By: Nelson Chimes M.D.   On: 11/16/2019 21:58   DG Chest Port 1 View  Result Date: 11/11/2019 CLINICAL DATA:  Coffee ground emesis. EXAM: PORTABLE CHEST 1 VIEW COMPARISON:  08/04/2009 FINDINGS: 11:15 Alencia Gordon.m. The lungs are clear without focal pneumonia, edema, pneumothorax or pleural effusion. Interstitial markings are diffusely coarsened with chronic features. Cardiopericardial silhouette is at upper limits of normal for size. Bones are diffusely demineralized. Telemetry leads overlie the chest. IMPRESSION: No active disease. Electronically Signed   By: Misty Stanley M.D.   On: 11/11/2019 11:37    Microbiology: Recent Results (from the past 240 hour(s))  Respiratory  Panel by RT PCR (Flu Henrine Hayter&B, Covid) - Nasopharyngeal Swab     Status: None   Collection Time: 11/11/19  1:45 PM   Specimen: Nasopharyngeal Swab  Result Value Ref Range Status   SARS Coronavirus 2 by RT PCR NEGATIVE NEGATIVE Final    Comment: (NOTE) SARS-CoV-2 target nucleic acids are NOT DETECTED. The SARS-CoV-2 RNA is generally detectable in upper respiratoy specimens during the acute phase of infection. The lowest concentration of SARS-CoV-2 viral copies this assay can detect is 131 copies/mL. Adell Panek negative result does not preclude SARS-Cov-2 infection and should not be used as the sole basis for treatment or other patient management decisions. Cashton Hosley negative result may occur with  improper specimen collection/handling, submission of specimen other than nasopharyngeal swab, presence of viral mutation(s) within the areas targeted by this assay, and inadequate number of viral copies (<131 copies/mL). Antonia Culbertson negative result must be combined with clinical observations, patient history, and epidemiological information. The expected result is Negative. Fact Sheet for Patients:  PinkCheek.be Fact Sheet for Healthcare Providers:  GravelBags.it This test is not yet ap proved or cleared by the [place] FDA and  has been authorized for detection and/or diagnosis of SARS-CoV-2 by FDA under an Emergency Use Authorization (EUA). This EUA will remain  in effect (meaning this test can be used) for the duration of the COVID-19 declaration under Section 564(b)(1) of the Act, 21 U.S.C. section 360bbb-3(b)(1), unless the authorization is terminated or revoked sooner.    Influenza Sesilia Poucher by PCR NEGATIVE NEGATIVE Final   Influenza B by PCR NEGATIVE NEGATIVE Final    Comment: (NOTE) The Xpert Xpress SARS-CoV-2/FLU/RSV assay is intended as an aid in  the diagnosis of influenza from Nasopharyngeal swab specimens and  should not be used as Jamon Hayhurst sole basis for  treatment. Nasal washings and  aspirates are unacceptable for Xpert Xpress SARS-CoV-2/FLU/RSV  testing. Fact Sheet for Patients: PinkCheek.be Fact Sheet for Healthcare Providers: GravelBags.it This test is not yet approved or cleared by the [place] FDA and  has been authorized for detection and/or diagnosis of SARS-CoV-2 by  FDA under an Emergency Use Authorization (EUA). This EUA will remain  in effect (meaning this test can be used) for the duration of the  Covid-19 declaration under Section 564(b)(1) of the Act, 21  U.S.C. section 360bbb-3(b)(1), unless the authorization is  terminated or revoked. Performed at [facility], [place] [address], [place], [place] [address]   MRSA PCR Screening     Status: None   Collection Time: 11/11/19  7:43 PM   Specimen: Nasal Mucosa; Nasopharyngeal  Result Value Ref Range Status   MRSA by PCR NEGATIVE NEGATIVE Final    Comment:        The GeneXpert MRSA Assay (FDA approved for NASAL specimens only), is  one component of Kayron Hicklin comprehensive MRSA colonization surveillance program. It is not intended to diagnose MRSA infection nor to guide or monitor treatment for MRSA infections. Performed at [facility], [place] [address], [place], [place] [address]   Respiratory Panel by RT PCR (Flu Carmelo Reidel&B, Covid) - Nasopharyngeal Swab     Status: None   Collection Time: 11/16/19 10:47 PM   Specimen: Nasopharyngeal Swab  Result Value Ref Range Status   SARS Coronavirus 2 by RT PCR NEGATIVE NEGATIVE Final    Comment: (NOTE) SARS-CoV-2 target nucleic acids are NOT DETECTED. The SARS-CoV-2 RNA is generally detectable in upper respiratoy specimens during the acute phase of infection. The lowest concentration of SARS-CoV-2 viral copies this assay can detect is 131 copies/mL. Olegario Emberson negative result does not preclude SARS-Cov-2 infection and should not be used as the sole  basis for treatment or other patient management decisions. Clarence Dunsmore negative result may occur with  improper specimen collection/handling, submission of specimen other than nasopharyngeal swab, presence of viral mutation(s) within the areas targeted by this assay, and inadequate number of viral copies (<131 copies/mL). Digna Countess negative result must be combined with clinical observations, patient history, and epidemiological information. The expected result is Negative. Fact Sheet for Patients:  PinkCheek.be Fact Sheet for Healthcare Providers:  GravelBags.it This test is not yet ap proved or cleared by the [place] FDA and  has been authorized for detection and/or diagnosis of SARS-CoV-2 by FDA under an Emergency Use Authorization (EUA). This EUA will remain  in effect (meaning this test can be used) for the duration of the COVID-19 declaration under Section 564(b)(1) of the Act, 21 U.S.C. section 360bbb-3(b)(1), unless the authorization is terminated or revoked sooner.    Influenza Madesyn Ast by PCR NEGATIVE NEGATIVE Final   Influenza B by PCR NEGATIVE NEGATIVE Final    Comment: (NOTE) The Xpert Xpress SARS-CoV-2/FLU/RSV assay is intended as an aid in  the diagnosis of influenza from Nasopharyngeal swab specimens and  should not be used as Bryttani Blew sole basis for treatment. Nasal washings and  aspirates are unacceptable for Xpert Xpress SARS-CoV-2/FLU/RSV  testing. Fact Sheet for Patients: PinkCheek.be Fact Sheet for Healthcare Providers: GravelBags.it This test is not yet approved or cleared by the [place] FDA and  has been authorized for detection and/or diagnosis of SARS-CoV-2 by  FDA under an Emergency Use Authorization (EUA). This EUA will remain  in effect (meaning this test can be used) for the duration of the  Covid-19 declaration under Section 564(b)(1) of the Act, 21  U.S.C.  section 360bbb-3(b)(1), unless the authorization is  terminated or revoked. Performed at [facility], [place] [address], [place], [place] [address]   MRSA PCR Screening     Status: None   Collection Time: 11/17/19 12:58 AM   Specimen: Nasopharyngeal  Result Value Ref Range Status   MRSA by PCR NEGATIVE NEGATIVE Final    Comment:        The GeneXpert MRSA Assay (FDA approved for NASAL specimens only), is one component of Michiko Lineman comprehensive MRSA colonization surveillance program. It is not intended to diagnose MRSA infection nor to guide or monitor treatment for MRSA infections. Performed at [facility], [place] [address], [place], [place] [address]      Labs: Basic Metabolic Panel: Recent Labs  Lab 11/14/19 0504 11/16/19 1906 11/17/19 0151 11/18/19 0409 11/19/19 0219  NA 138 144 145 145 141  K 3.4* 4.0 3.9 3.9 4.3  CL 105 104 108 112*  107  CO2 27 29 30 27 27   GLUCOSE 96 136* 124* 95 99  BUN 22 47* 42* 24* 20  CREATININE 1.19 2.04* 1.70* 1.25* 1.34*  CALCIUM 9.1 9.4 8.9 8.8* 8.8*  MG  --   --   --  2.0 1.9  PHOS  --   --   --  3.4 3.2   Liver Function Tests: Recent Labs  Lab 11/16/19 1906 11/18/19 0409 11/19/19 0219  AST 31 21 24   ALT 24 18 17   ALKPHOS 69 51 56  BILITOT 0.5 0.6 0.7  PROT 7.2 5.7* 6.1*  ALBUMIN 3.8 2.9* 3.0*   Recent Labs  Lab 11/16/19 1906  LIPASE 19   No results for input(s): AMMONIA in the last 168 hours. CBC: Recent Labs  Lab 11/14/19 0504 11/14/19 1510 11/16/19 1906 11/16/19 1906 11/17/19 0151 11/17/19 0151 11/17/19 1654 11/17/19 2248 11/18/19 0409 11/18/19 1248 11/19/19 0219  WBC 8.7  --  16.4*  --  13.4*  --   --   --  8.5  --  8.6  NEUTROABS  --   --  13.7*  --   --   --   --   --  5.1  --  5.3  HGB 7.8*   < > 9.2*   < > 8.6*   < > 8.0* 7.9* 7.3* 7.6* 7.6*  HCT 26.6*   < > 30.1*   < > 28.4*   < > 26.8* 26.5* 24.9* 26.0* 25.6*  MCV 87.5  --  86.7  --  87.9  --   --   --  90.5  --  89.2   PLT 176  --  197  --  189  --   --   --  167  --  192   < > = values in this interval not displayed.   Cardiac Enzymes: No results for input(s): CKTOTAL, CKMB, CKMBINDEX, TROPONINI in the last 168 hours. BNP: BNP (last 3 results) No results for input(s): BNP in the last 8760 hours.  ProBNP (last 3 results) No results for input(s): PROBNP in the last 8760 hours.  CBG: No results for input(s): GLUCAP in the last 168 hours.     Signed:  Fayrene Helper MD.  [facility] 11/19/2019, 11:31 AM

## 2019-11-19 NOTE — Evaluation (Signed)
Physical Therapy Evaluation Patient Details Name: Christian Bennett MRN: [id number] DOB: Feb 15, 1928 Today's Date: 11/19/2019   History of Present Illness  86 yo male admitted with UGIB, N/V, esophagitis. Hx of Alz dementia, CKD, TIA, CVA  Clinical Impression  On eval, pt required Mod assist +2 safety for mobility. He walked ~125 feet with 2 HHA. He tolerated activity well. He did c/o some heel pain but he did not specify which one. He was pleasant and cooperative. Assisted back to bed at end of session with sitter present. Will follow during hospital stay. Unsure of most recent PLOF at ALF.     Follow Up Recommendations Home health PT;Supervision/Assistance - 24 hour(return to ALF as long as they can provide current level of care)    Equipment Recommendations  None recommended by PT    Recommendations for Other Services       Precautions / Restrictions Precautions Precautions: Fall Precaution Comments: mittens Restrictions Weight Bearing Restrictions: No      Mobility  Bed Mobility Overal bed mobility: Needs Assistance Bed Mobility: Supine to Sit     Supine to sit: Mod assist     General bed mobility comments: Assist to intiate task. Assist for trunk and bil LEs. Utilized beppad to aid with scooting  Transfers Overall transfer level: Needs assistance   Transfers: Sit to/from Stand Sit to Stand: Mod assist;From elevated surface         General transfer comment: Assist to rise,steady, control descent. Multimodal cueing required.  Ambulation/Gait Ambulation/Gait assistance: Min assist;+2 physical assistance;+2 safety/equipment Gait Distance (Feet): 125 Feet Assistive device: 2 person hand held assist Gait Pattern/deviations: Step-through pattern;Decreased stride length;Narrow base of support     General Gait Details: Assist to stabilize throughout distance. Pt went back and forth between 2 HHA vs 1 HHA. Near scissoring intermittently. He tends to reach out for  objects in the environment to hold onto, especially in tight spaces (inside room).  Stairs            Wheelchair Mobility    Modified Rankin (Stroke Patients Only)       Balance Overall balance assessment: Needs assistance         Standing balance support: Bilateral upper extremity supported Standing balance-[name]: Poor                               Pertinent Vitals/Pain Pain Assessment: Faces Faces Pain Scale: Hurts a little bit Pain Location: heel (pt didn't state which one) Pain Intervention(s): Monitored during session;Repositioned    Home Living Family/patient expects to be discharged to:: Assisted living               Home Equipment: None      Prior Function Level of Independence: Independent         Comments: per last admission info 07/2019. Unsure of most recent PLOF/DME needs     Hand Dominance        Extremity/Trunk Assessment   Upper Extremity Assessment Upper Extremity Assessment: Generalized weakness    Lower Extremity Assessment Lower Extremity Assessment: Generalized weakness    Cervical / Trunk Assessment Cervical / Trunk Assessment: Kyphotic  Communication   Communication: No difficulties  Cognition Arousal/Alertness: Awake/alert Behavior During Therapy: WFL for tasks assessed/performed Overall Cognitive Status: History of cognitive impairments - at baseline  General Comments: pt is cooperative and pleasant      General Comments      Exercises     Assessment/Plan    PT Assessment Patient needs continued PT services  PT Problem List Decreased strength;Decreased mobility;Decreased balance;Decreased cognition;Decreased knowledge of use of DME       PT Treatment Interventions DME instruction;Gait training;Therapeutic activities;Therapeutic exercise;Patient/family education;Balance training;Functional mobility training    PT Goals (Current goals can  be found in the Care Plan section)  Acute Rehab PT Goals Patient Stated Goal: none stated PT Goal Formulation: Patient unable to participate in goal setting Time For Goal Achievement: 12/03/19 Potential to Achieve Goals: Fair    Frequency Min 3X/week   Barriers to discharge        Co-evaluation               AM-PAC PT "6 Clicks" Mobility  Outcome Measure Help needed turning from your back to your side while in a flat bed without using bedrails?: A Lot Help needed moving from lying on your back to sitting on the side of a flat bed without using bedrails?: A Lot Help needed moving to and from a bed to a chair (including a wheelchair)?: A Lot Help needed standing up from a chair using your arms (e.g., wheelchair or bedside chair)?: A Lot Help needed to walk in hospital room?: A Little Help needed climbing 3-5 steps with a railing? : A Lot 6 Click Score: 13    End of Session Equipment Utilized During Treatment: Gait belt Activity Tolerance: Patient tolerated treatment well Patient left: in bed;with call bell/phone within reach;with bed alarm set;with nursing/sitter in room   PT Visit Diagnosis: Unsteadiness on feet (R26.81);Muscle weakness (generalized) (M62.81);Difficulty in walking, not elsewhere classified (R26.2)    Time: [id number] PT Time Calculation (min) (ACUTE ONLY): 10 min   Charges:   PT Evaluation $PT Eval Moderate Complexity: 1 Mod             Collie Wernick P, PT Acute Rehabilitation

## 2019-11-19 NOTE — TOC Progression Note (Signed)
Transition of Care [facility]) - Progression Note    Patient Details  Name: Adrienne Winders MRN: [id number] Date of Birth: [birth year]  [facility]) CM/SW Contact  Purcell Mouton, RN Phone Number: 11/19/2019, 2:19 PM  Clinical Narrative:    Corey Harold was called for transportation at 3:30 PM. Nanine Means was called and spoke with Nia, RN.    Expected Discharge Plan: Memory Care Barriers to Discharge: Continued Medical Work up  Expected Discharge Plan and Services Expected Discharge Plan: Memory Care   Discharge Planning Services: CM Consult   Living arrangements for the past 2 months: [place] Expected Discharge Date: 11/19/19                                     Social Determinants of Health (SDOH) Interventions    Readmission Risk Interventions Readmission Risk Prevention Plan 11/18/2019  Transportation Screening Complete  PCP or Specialist Appt within 3-5 Days Complete  HRI or [place] Complete  Social Work Consult for [place] Planning/Counseling Complete  Palliative Care Screening (No Data)  Medication Review Press photographer) Complete  Some recent data might be hidden

## 2019-11-19 NOTE — TOC Progression Note (Signed)
Transition of Care [facility]) - Progression Note    Patient Details  Name: Christian Bennett MRN: [id number] Date of Birth: 01-10-1928  Transition of Care [facility]) CM/SW Contact  Purcell Mouton, RN Phone Number: 11/19/2019, 1:53 PM  Clinical Narrative:     Spoke with Tammy, RN Coordinator at [facility] concerning pt's return. Pt may return today after 3:30 PM, related to staffing.   Expected Discharge Plan: Memory Care Barriers to Discharge: Continued Medical Work up  Expected Discharge Plan and Services Expected Discharge Plan: Memory Care   Discharge Planning Services: CM Consult   Living arrangements for the past 2 months: [place] Expected Discharge Date: 11/19/19                                     Social Determinants of Health (SDOH) Interventions    Readmission Risk Interventions Readmission Risk Prevention Plan 11/18/2019  Transportation Screening Complete  PCP or Specialist Appt within 3-5 Days Complete  HRI or [place] Complete  Social Work Consult for [place] Planning/Counseling Complete  Palliative Care Screening (No Data)  Medication Review Press photographer) Complete  Some recent data might be hidden

## 2019-11-19 NOTE — Care Management Important Message (Signed)
Important Message  Patient Details IM Letter given to [name] to present to the Patient Name: Christian Bennett MRN: [id number] Date of Birth: 1928/02/16   Medicare Important Message Given:  Yes     Kerin Salen 11/19/2019, 10:16 AM

## 2019-11-19 NOTE — Progress Notes (Signed)
PCP was notified to have the order renewed for safety sitter.

## 2019-11-19 NOTE — NC FL2 (Signed)
[place] LEVEL OF CARE SCREENING TOOL     IDENTIFICATION  Patient Name: Christian Bennett Birthdate: [birth year] Sex: male Admission Date (Current Location): 11/16/2019  [facility] and [place] Number:  Herbalist and Address:  [facility],  [place], [place]      Provider Number: [id number]  Attending Physician Name and Address:  Elodia Florence., *  Relative Name and Phone Number:  Gaye Alken [phone] daughter    Current Level of Care: Hospital Recommended Level of Care: Memory Care, Price Prior Approval Number:    Date Approved/Denied:   PASRR Number:    Discharge Plan: Other (Comment)(ALF Memory Care)    Current Diagnoses: Patient Active Problem List   Diagnosis Date Noted  . Esophagitis 11/16/2019  . AKI (acute kidney injury) ([place]) 11/16/2019  . CKD (chronic kidney disease), stage III 11/13/2019  . Upper GI bleed 11/12/2019  . GI bleeding 11/11/2019  . Cognitive dysfunction 10/25/2019  . History of CVA (cerebrovascular accident) 10/25/2019  . Anemia 10/25/2019  . Hyperlipidemia 09/26/2019  . Essential hypertension 09/26/2019  . TIA due to embolism ([place]) 09/26/2019  . H/O carotid endarterectomy 09/26/2019  . Carotid artery stenosis, symptomatic, right 07/25/2019  . CVA (cerebral vascular accident) ([place]) 07/11/2019  . TIA (transient ischemic attack) 07/10/2019  . Adjustment disorder with mixed anxiety and depressed mood 06/20/2018  . Vitamin D insufficiency 06/20/2018  . History of anemia 06/20/2018  . Medically noncompliant-   does not want to go for hearing eval 06/20/2018  . Bilateral impacted cerumen 02/07/2018  . Hearing difficulty of both ears 02/03/2018  . Environmental and seasonal allergies 02/03/2018  . Eustachian tube dysfunction, bilateral 02/03/2018  . History of vitamin D deficiency 01/30/2018  . Contact dermatitis and eczema 10/19/2017  . HTN (hypertension)  06/22/2017  . GERD (gastroesophageal reflux disease) 06/22/2017  . Hx of colonoscopy 06/22/2017  . Mood disorder ([place]) 06/22/2017  . Hypothyroidism 06/22/2017  . Chronic pancreatitis ([place]) 06/22/2017    Orientation RESPIRATION BLADDER Height & Weight     Self  Normal Incontinent Weight: 70.5 kg Height:  5\' 9"  (175.3 cm)  BEHAVIORAL SYMPTOMS/MOOD NEUROLOGICAL BOWEL NUTRITION STATUS      Continent Diet(Regular soft)  AMBULATORY STATUS COMMUNICATION OF NEEDS Skin   Limited Assist Verbally Other (Comment)(Ecchymosis Both leg)                       Personal Care Assistance Level of Assistance  Bathing, Feeding, Dressing Bathing Assistance: Limited assistance Feeding assistance: Independent Dressing Assistance: Limited assistance     Functional Limitations Info    Sight Info: Adequate Hearing Info: Impaired(HOH) Speech Info: Adequate    SPECIAL CARE FACTORS FREQUENCY  PT (By licensed PT), OT (By licensed OT)     PT Frequency: Eval and Treat OT Frequency: Eval and Treat            Contractures Contractures Info: Not present    Additional Factors Info  Code Status, Allergies Code Status Info: DNR Allergies Info: Doxycycline, Eggs Or Egg-derived Products           Current Medications (11/19/2019):  This is the current hospital active medication list Current Facility-Administered Medications  Medication Dose Route Frequency Provider Last Rate Last Admin  . 0.9 %  sodium chloride infusion   Intravenous PRN Elodia Florence., MD 10 mL/hr at 11/18/19 1458 1,000 mL at 11/18/19 1458  . amLODipine (NORVASC) tablet 5 mg  5 mg Oral Daily Elodia Florence., MD   5 mg at 11/19/19 [id number]  . atorvastatin (LIPITOR) tablet 40 mg  40 mg Oral q1800 Elodia Florence., MD   40 mg at 11/18/19 1838  . Chlorhexidine Gluconate Cloth 2 % PADS 6 each  6 each Topical Daily Shela Leff, MD      . divalproex (DEPAKOTE) DR tablet 125 mg  125 mg Oral Daily Elodia Florence., MD   125 mg at 11/19/19 0909  . lipase/protease/amylase (CREON) capsule 36,000 Units  36,000 Units Oral TID WC Elodia Florence., MD   36,000 Units at 11/19/19 [phone]  . MEDLINE mouth rinse  15 mL Mouth Rinse BID Elodia Florence., MD   15 mL at 11/19/19 0910  . ondansetron (ZOFRAN) tablet 4 mg  4 mg Oral Q8H Elodia Florence., MD   4 mg at 11/19/19 [id number]  . pantoprazole (PROTONIX) injection 40 mg  40 mg Intravenous Q12H Shela Leff, MD   40 mg at 11/19/19 0908  . QUEtiapine (SEROQUEL) tablet 12.5 mg  12.5 mg Oral QHS Elodia Florence., MD   12.5 mg at 11/18/19 2130  . sertraline (ZOLOFT) tablet 25 mg  25 mg Oral Daily Elodia Florence., MD   25 mg at 11/19/19 0909  . thyroid (ARMOUR) tablet 60 mg  60 mg Oral QAC breakfast Elodia Florence., MD   60 mg at 11/19/19 [id number]     Discharge Medications: Please see discharge summary for a list of discharge medications.  Relevant Imaging Results:  Relevant Lab Results:   Additional Information [id number]  Purcell Mouton, RN

## 2019-11-20 LAB — BPAM RBC
Blood Product Expiration Date: 202103192359
Blood Product Expiration Date: 202103192359
Unit Type and Rh: 5100
Unit Type and Rh: 5100

## 2019-11-20 LAB — TYPE AND SCREEN
ABO/RH(D): O POS
ABO/RH(D): O POS
Antibody Screen: POSITIVE
Antibody Screen: POSITIVE
DAT, IgG: POSITIVE
Unit division: 0
Unit division: 0

## 2019-11-21 DIAGNOSIS — E039 Hypothyroidism, unspecified: Secondary | ICD-10-CM | POA: Diagnosis not present

## 2019-11-21 DIAGNOSIS — I69398 Other sequelae of cerebral infarction: Secondary | ICD-10-CM | POA: Diagnosis not present

## 2019-11-21 DIAGNOSIS — F0281 Dementia in other diseases classified elsewhere with behavioral disturbance: Secondary | ICD-10-CM | POA: Diagnosis not present

## 2019-11-21 DIAGNOSIS — R269 Unspecified abnormalities of gait and mobility: Secondary | ICD-10-CM | POA: Diagnosis not present

## 2019-11-21 DIAGNOSIS — Z9183 Wandering in diseases classified elsewhere: Secondary | ICD-10-CM | POA: Diagnosis not present

## 2019-11-21 DIAGNOSIS — M810 Age-related osteoporosis without current pathological fracture: Secondary | ICD-10-CM | POA: Diagnosis not present

## 2019-11-26 ENCOUNTER — Other Ambulatory Visit: Payer: Medicare Other

## 2019-11-26 DIAGNOSIS — F0391 Unspecified dementia with behavioral disturbance: Secondary | ICD-10-CM | POA: Diagnosis not present

## 2019-11-26 DIAGNOSIS — E782 Mixed hyperlipidemia: Secondary | ICD-10-CM | POA: Diagnosis not present

## 2019-11-26 DIAGNOSIS — K922 Gastrointestinal hemorrhage, unspecified: Secondary | ICD-10-CM | POA: Diagnosis not present

## 2019-11-26 DIAGNOSIS — E039 Hypothyroidism, unspecified: Secondary | ICD-10-CM | POA: Diagnosis not present

## 2019-11-26 DIAGNOSIS — Z79899 Other long term (current) drug therapy: Secondary | ICD-10-CM | POA: Diagnosis not present

## 2019-11-26 DIAGNOSIS — I10 Essential (primary) hypertension: Secondary | ICD-10-CM | POA: Diagnosis not present

## 2019-11-26 DIAGNOSIS — Z09 Encounter for follow-up examination after completed treatment for conditions other than malignant neoplasm: Secondary | ICD-10-CM | POA: Diagnosis not present

## 2019-11-27 DIAGNOSIS — Z9183 Wandering in diseases classified elsewhere: Secondary | ICD-10-CM | POA: Diagnosis not present

## 2019-11-27 DIAGNOSIS — I69398 Other sequelae of cerebral infarction: Secondary | ICD-10-CM | POA: Diagnosis not present

## 2019-11-27 DIAGNOSIS — F0281 Dementia in other diseases classified elsewhere with behavioral disturbance: Secondary | ICD-10-CM | POA: Diagnosis not present

## 2019-11-27 DIAGNOSIS — R269 Unspecified abnormalities of gait and mobility: Secondary | ICD-10-CM | POA: Diagnosis not present

## 2019-11-27 DIAGNOSIS — E039 Hypothyroidism, unspecified: Secondary | ICD-10-CM | POA: Diagnosis not present

## 2019-11-27 DIAGNOSIS — M810 Age-related osteoporosis without current pathological fracture: Secondary | ICD-10-CM | POA: Diagnosis not present

## 2019-11-28 DIAGNOSIS — F329 Major depressive disorder, single episode, unspecified: Secondary | ICD-10-CM | POA: Diagnosis not present

## 2019-11-28 DIAGNOSIS — R451 Restlessness and agitation: Secondary | ICD-10-CM | POA: Diagnosis not present

## 2019-11-28 DIAGNOSIS — F0391 Unspecified dementia with behavioral disturbance: Secondary | ICD-10-CM | POA: Diagnosis not present

## 2019-11-29 DIAGNOSIS — M810 Age-related osteoporosis without current pathological fracture: Secondary | ICD-10-CM | POA: Diagnosis not present

## 2019-11-29 DIAGNOSIS — R269 Unspecified abnormalities of gait and mobility: Secondary | ICD-10-CM | POA: Diagnosis not present

## 2019-11-29 DIAGNOSIS — Z9183 Wandering in diseases classified elsewhere: Secondary | ICD-10-CM | POA: Diagnosis not present

## 2019-11-29 DIAGNOSIS — F0281 Dementia in other diseases classified elsewhere with behavioral disturbance: Secondary | ICD-10-CM | POA: Diagnosis not present

## 2019-11-29 DIAGNOSIS — I69398 Other sequelae of cerebral infarction: Secondary | ICD-10-CM | POA: Diagnosis not present

## 2019-11-29 DIAGNOSIS — E039 Hypothyroidism, unspecified: Secondary | ICD-10-CM | POA: Diagnosis not present

## 2019-12-03 DIAGNOSIS — E039 Hypothyroidism, unspecified: Secondary | ICD-10-CM | POA: Diagnosis not present

## 2019-12-03 DIAGNOSIS — F0281 Dementia in other diseases classified elsewhere with behavioral disturbance: Secondary | ICD-10-CM | POA: Diagnosis not present

## 2019-12-03 DIAGNOSIS — I69398 Other sequelae of cerebral infarction: Secondary | ICD-10-CM | POA: Diagnosis not present

## 2019-12-03 DIAGNOSIS — R269 Unspecified abnormalities of gait and mobility: Secondary | ICD-10-CM | POA: Diagnosis not present

## 2019-12-03 DIAGNOSIS — Z9183 Wandering in diseases classified elsewhere: Secondary | ICD-10-CM | POA: Diagnosis not present

## 2019-12-03 DIAGNOSIS — M810 Age-related osteoporosis without current pathological fracture: Secondary | ICD-10-CM | POA: Diagnosis not present

## 2019-12-06 DIAGNOSIS — M810 Age-related osteoporosis without current pathological fracture: Secondary | ICD-10-CM | POA: Diagnosis not present

## 2019-12-06 DIAGNOSIS — F0281 Dementia in other diseases classified elsewhere with behavioral disturbance: Secondary | ICD-10-CM | POA: Diagnosis not present

## 2019-12-06 DIAGNOSIS — Z9183 Wandering in diseases classified elsewhere: Secondary | ICD-10-CM | POA: Diagnosis not present

## 2019-12-06 DIAGNOSIS — R269 Unspecified abnormalities of gait and mobility: Secondary | ICD-10-CM | POA: Diagnosis not present

## 2019-12-06 DIAGNOSIS — E039 Hypothyroidism, unspecified: Secondary | ICD-10-CM | POA: Diagnosis not present

## 2019-12-06 DIAGNOSIS — I69398 Other sequelae of cerebral infarction: Secondary | ICD-10-CM | POA: Diagnosis not present

## 2019-12-09 DIAGNOSIS — F4323 Adjustment disorder with mixed anxiety and depressed mood: Secondary | ICD-10-CM | POA: Diagnosis not present

## 2019-12-09 DIAGNOSIS — I69398 Other sequelae of cerebral infarction: Secondary | ICD-10-CM | POA: Diagnosis not present

## 2019-12-09 DIAGNOSIS — M810 Age-related osteoporosis without current pathological fracture: Secondary | ICD-10-CM | POA: Diagnosis not present

## 2019-12-09 DIAGNOSIS — I129 Hypertensive chronic kidney disease with stage 1 through stage 4 chronic kidney disease, or unspecified chronic kidney disease: Secondary | ICD-10-CM | POA: Diagnosis not present

## 2019-12-09 DIAGNOSIS — E039 Hypothyroidism, unspecified: Secondary | ICD-10-CM | POA: Diagnosis not present

## 2019-12-09 DIAGNOSIS — K2101 Gastro-esophageal reflux disease with esophagitis, with bleeding: Secondary | ICD-10-CM | POA: Diagnosis not present

## 2019-12-09 DIAGNOSIS — G309 Alzheimer's disease, unspecified: Secondary | ICD-10-CM | POA: Diagnosis not present

## 2019-12-09 DIAGNOSIS — Z8673 Personal history of transient ischemic attack (TIA), and cerebral infarction without residual deficits: Secondary | ICD-10-CM | POA: Diagnosis not present

## 2019-12-09 DIAGNOSIS — F0281 Dementia in other diseases classified elsewhere with behavioral disturbance: Secondary | ICD-10-CM | POA: Diagnosis not present

## 2019-12-09 DIAGNOSIS — E782 Mixed hyperlipidemia: Secondary | ICD-10-CM | POA: Diagnosis not present

## 2019-12-09 DIAGNOSIS — N183 Chronic kidney disease, stage 3 unspecified: Secondary | ICD-10-CM | POA: Diagnosis not present

## 2019-12-09 DIAGNOSIS — D62 Acute posthemorrhagic anemia: Secondary | ICD-10-CM | POA: Diagnosis not present

## 2019-12-09 DIAGNOSIS — Z9183 Wandering in diseases classified elsewhere: Secondary | ICD-10-CM | POA: Diagnosis not present

## 2019-12-09 DIAGNOSIS — R269 Unspecified abnormalities of gait and mobility: Secondary | ICD-10-CM | POA: Diagnosis not present

## 2019-12-10 DIAGNOSIS — R269 Unspecified abnormalities of gait and mobility: Secondary | ICD-10-CM | POA: Diagnosis not present

## 2019-12-10 DIAGNOSIS — M810 Age-related osteoporosis without current pathological fracture: Secondary | ICD-10-CM | POA: Diagnosis not present

## 2019-12-10 DIAGNOSIS — K2101 Gastro-esophageal reflux disease with esophagitis, with bleeding: Secondary | ICD-10-CM | POA: Diagnosis not present

## 2019-12-10 DIAGNOSIS — G309 Alzheimer's disease, unspecified: Secondary | ICD-10-CM | POA: Diagnosis not present

## 2019-12-10 DIAGNOSIS — D62 Acute posthemorrhagic anemia: Secondary | ICD-10-CM | POA: Diagnosis not present

## 2019-12-10 DIAGNOSIS — I69398 Other sequelae of cerebral infarction: Secondary | ICD-10-CM | POA: Diagnosis not present

## 2019-12-11 ENCOUNTER — Other Ambulatory Visit: Payer: Self-pay

## 2019-12-11 ENCOUNTER — Ambulatory Visit (INDEPENDENT_AMBULATORY_CARE_PROVIDER_SITE_OTHER): Payer: Medicare Other | Admitting: Adult Health

## 2019-12-11 ENCOUNTER — Encounter: Payer: Self-pay | Admitting: Adult Health

## 2019-12-11 VITALS — BP 107/52 | HR 82 | Temp 97.9°F | Ht 69.0 in | Wt 157.2 lb

## 2019-12-11 DIAGNOSIS — E785 Hyperlipidemia, unspecified: Secondary | ICD-10-CM

## 2019-12-11 DIAGNOSIS — I6521 Occlusion and stenosis of right carotid artery: Secondary | ICD-10-CM | POA: Diagnosis not present

## 2019-12-11 DIAGNOSIS — I10 Essential (primary) hypertension: Secondary | ICD-10-CM | POA: Diagnosis not present

## 2019-12-11 DIAGNOSIS — F0151 Vascular dementia with behavioral disturbance: Secondary | ICD-10-CM

## 2019-12-11 DIAGNOSIS — F01518 Vascular dementia, unspecified severity, with other behavioral disturbance: Secondary | ICD-10-CM

## 2019-12-11 DIAGNOSIS — Z8673 Personal history of transient ischemic attack (TIA), and cerebral infarction without residual deficits: Secondary | ICD-10-CM

## 2019-12-11 NOTE — Progress Notes (Signed)
I agree with the above plan 

## 2019-12-11 NOTE — Patient Instructions (Signed)
Return to taking aspirin 81 mg daily once cleared by GI and continue atorvastatin  for secondary stroke prevention  Continue all other medications to assist with behaviors - defer ongoing monitoring and management to facility  Continue to follow up with PCP regarding cholesterol and blood pressure management   Continue to monitor blood pressure at home  Maintain strict control of hypertension with blood pressure goal below 130/90, diabetes with hemoglobin A1c goal below 6.5% and cholesterol with LDL cholesterol (bad cholesterol) goal below 70 mg/dL. I also advised the patient to eat a healthy diet with plenty of whole grains, cereals, fruits and vegetables, exercise regularly and maintain ideal body weight.  Followup in the future with me in 6 months or call earlier if needed       Thank you for coming to see [place] at [facility]. I hope we have been able to provide you high quality care today.  You may receive a patient satisfaction survey over the next few weeks. We would appreciate your feedback and comments so that we may continue to improve ourselves and the health of our patients.
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
## 2019-12-12 DIAGNOSIS — M810 Age-related osteoporosis without current pathological fracture: Secondary | ICD-10-CM | POA: Diagnosis not present

## 2019-12-12 DIAGNOSIS — K2101 Gastro-esophageal reflux disease with esophagitis, with bleeding: Secondary | ICD-10-CM | POA: Diagnosis not present

## 2019-12-12 DIAGNOSIS — G309 Alzheimer's disease, unspecified: Secondary | ICD-10-CM | POA: Diagnosis not present

## 2019-12-12 DIAGNOSIS — D62 Acute posthemorrhagic anemia: Secondary | ICD-10-CM | POA: Diagnosis not present

## 2019-12-12 DIAGNOSIS — I69398 Other sequelae of cerebral infarction: Secondary | ICD-10-CM | POA: Diagnosis not present

## 2019-12-12 DIAGNOSIS — R269 Unspecified abnormalities of gait and mobility: Secondary | ICD-10-CM | POA: Diagnosis not present

## 2019-12-13 ENCOUNTER — Ambulatory Visit: Payer: Medicare Other | Admitting: Adult Health

## 2019-12-14 DIAGNOSIS — F331 Major depressive disorder, recurrent, moderate: Secondary | ICD-10-CM | POA: Diagnosis not present

## 2019-12-14 DIAGNOSIS — F0151 Vascular dementia with behavioral disturbance: Secondary | ICD-10-CM | POA: Diagnosis not present

## 2019-12-17 DIAGNOSIS — K449 Diaphragmatic hernia without obstruction or gangrene: Secondary | ICD-10-CM | POA: Diagnosis not present

## 2019-12-17 DIAGNOSIS — K2101 Gastro-esophageal reflux disease with esophagitis, with bleeding: Secondary | ICD-10-CM | POA: Diagnosis not present

## 2019-12-19 DIAGNOSIS — I69398 Other sequelae of cerebral infarction: Secondary | ICD-10-CM | POA: Diagnosis not present

## 2019-12-19 DIAGNOSIS — M810 Age-related osteoporosis without current pathological fracture: Secondary | ICD-10-CM | POA: Diagnosis not present

## 2019-12-19 DIAGNOSIS — K2101 Gastro-esophageal reflux disease with esophagitis, with bleeding: Secondary | ICD-10-CM | POA: Diagnosis not present

## 2019-12-19 DIAGNOSIS — R269 Unspecified abnormalities of gait and mobility: Secondary | ICD-10-CM | POA: Diagnosis not present

## 2019-12-19 DIAGNOSIS — G309 Alzheimer's disease, unspecified: Secondary | ICD-10-CM | POA: Diagnosis not present

## 2019-12-19 DIAGNOSIS — D62 Acute posthemorrhagic anemia: Secondary | ICD-10-CM | POA: Diagnosis not present

## 2019-12-21 DIAGNOSIS — K2101 Gastro-esophageal reflux disease with esophagitis, with bleeding: Secondary | ICD-10-CM | POA: Diagnosis not present

## 2019-12-21 DIAGNOSIS — I69398 Other sequelae of cerebral infarction: Secondary | ICD-10-CM | POA: Diagnosis not present

## 2019-12-21 DIAGNOSIS — G309 Alzheimer's disease, unspecified: Secondary | ICD-10-CM | POA: Diagnosis not present

## 2019-12-21 DIAGNOSIS — D62 Acute posthemorrhagic anemia: Secondary | ICD-10-CM | POA: Diagnosis not present

## 2019-12-21 DIAGNOSIS — R269 Unspecified abnormalities of gait and mobility: Secondary | ICD-10-CM | POA: Diagnosis not present

## 2019-12-21 DIAGNOSIS — M810 Age-related osteoporosis without current pathological fracture: Secondary | ICD-10-CM | POA: Diagnosis not present

## 2019-12-24 DIAGNOSIS — D62 Acute posthemorrhagic anemia: Secondary | ICD-10-CM | POA: Diagnosis not present

## 2019-12-24 DIAGNOSIS — K2101 Gastro-esophageal reflux disease with esophagitis, with bleeding: Secondary | ICD-10-CM | POA: Diagnosis not present

## 2019-12-24 DIAGNOSIS — R269 Unspecified abnormalities of gait and mobility: Secondary | ICD-10-CM | POA: Diagnosis not present

## 2019-12-24 DIAGNOSIS — M810 Age-related osteoporosis without current pathological fracture: Secondary | ICD-10-CM | POA: Diagnosis not present

## 2019-12-24 DIAGNOSIS — G309 Alzheimer's disease, unspecified: Secondary | ICD-10-CM | POA: Diagnosis not present

## 2019-12-24 DIAGNOSIS — I69398 Other sequelae of cerebral infarction: Secondary | ICD-10-CM | POA: Diagnosis not present

## 2019-12-26 ENCOUNTER — Emergency Department (HOSPITAL_COMMUNITY)
Admission: EM | Admit: 2019-12-26 | Discharge: 2019-12-26 | Disposition: A | Discharge status: Skilled Nursing Facility | Payer: Medicare Other | Attending: Emergency Medicine | Admitting: Emergency Medicine

## 2019-12-26 ENCOUNTER — Other Ambulatory Visit: Payer: Self-pay

## 2019-12-26 ENCOUNTER — Encounter (HOSPITAL_COMMUNITY): Payer: Self-pay

## 2019-12-26 DIAGNOSIS — K2101 Gastro-esophageal reflux disease with esophagitis, with bleeding: Secondary | ICD-10-CM | POA: Diagnosis not present

## 2019-12-26 DIAGNOSIS — I129 Hypertensive chronic kidney disease with stage 1 through stage 4 chronic kidney disease, or unspecified chronic kidney disease: Secondary | ICD-10-CM | POA: Insufficient documentation

## 2019-12-26 DIAGNOSIS — K219 Gastro-esophageal reflux disease without esophagitis: Secondary | ICD-10-CM | POA: Insufficient documentation

## 2019-12-26 DIAGNOSIS — N183 Chronic kidney disease, stage 3 unspecified: Secondary | ICD-10-CM | POA: Insufficient documentation

## 2019-12-26 DIAGNOSIS — R269 Unspecified abnormalities of gait and mobility: Secondary | ICD-10-CM | POA: Diagnosis not present

## 2019-12-26 DIAGNOSIS — D649 Anemia, unspecified: Secondary | ICD-10-CM | POA: Diagnosis not present

## 2019-12-26 DIAGNOSIS — R899 Unspecified abnormal finding in specimens from other organs, systems and tissues: Secondary | ICD-10-CM

## 2019-12-26 DIAGNOSIS — Z79899 Other long term (current) drug therapy: Secondary | ICD-10-CM | POA: Diagnosis not present

## 2019-12-26 DIAGNOSIS — G309 Alzheimer's disease, unspecified: Secondary | ICD-10-CM | POA: Diagnosis not present

## 2019-12-26 DIAGNOSIS — I959 Hypotension, unspecified: Secondary | ICD-10-CM | POA: Diagnosis not present

## 2019-12-26 DIAGNOSIS — R410 Disorientation, unspecified: Secondary | ICD-10-CM | POA: Diagnosis not present

## 2019-12-26 DIAGNOSIS — R7889 Finding of other specified substances, not normally found in blood: Secondary | ICD-10-CM | POA: Diagnosis not present

## 2019-12-26 DIAGNOSIS — R0902 Hypoxemia: Secondary | ICD-10-CM | POA: Diagnosis not present

## 2019-12-26 DIAGNOSIS — R799 Abnormal finding of blood chemistry, unspecified: Secondary | ICD-10-CM | POA: Diagnosis not present

## 2019-12-26 DIAGNOSIS — D62 Acute posthemorrhagic anemia: Secondary | ICD-10-CM | POA: Diagnosis not present

## 2019-12-26 DIAGNOSIS — I69398 Other sequelae of cerebral infarction: Secondary | ICD-10-CM | POA: Diagnosis not present

## 2019-12-26 DIAGNOSIS — Z743 Need for continuous supervision: Secondary | ICD-10-CM | POA: Diagnosis not present

## 2019-12-26 DIAGNOSIS — R5381 Other malaise: Secondary | ICD-10-CM | POA: Diagnosis not present

## 2019-12-26 DIAGNOSIS — R279 Unspecified lack of coordination: Secondary | ICD-10-CM | POA: Diagnosis not present

## 2019-12-26 DIAGNOSIS — M810 Age-related osteoporosis without current pathological fracture: Secondary | ICD-10-CM | POA: Diagnosis not present

## 2019-12-26 LAB — CBC WITH DIFFERENTIAL/PLATELET
Abs Immature Granulocytes: 0.02 10*3/uL (ref 0.00–0.07)
Basophils Absolute: 0.1 10*3/uL (ref 0.0–0.1)
Basophils Relative: 1 %
Eosinophils Absolute: 0.3 10*3/uL (ref 0.0–0.5)
Eosinophils Relative: 5 %
HCT: 26.2 % — ABNORMAL LOW (ref 39.0–52.0)
Hemoglobin: 7.9 g/dL — ABNORMAL LOW (ref 13.0–17.0)
Immature Granulocytes: 0 %
Lymphocytes Relative: 22 %
Lymphs Abs: 1.2 10*3/uL (ref 0.7–4.0)
MCH: 26.2 pg (ref 26.0–34.0)
MCHC: 30.2 g/dL (ref 30.0–36.0)
MCV: 87 fL (ref 80.0–100.0)
Monocytes Absolute: 0.7 10*3/uL (ref 0.1–1.0)
Monocytes Relative: 13 %
Neutro Abs: 3.2 10*3/uL (ref 1.7–7.7)
Neutrophils Relative %: 59 %
Platelets: 179 10*3/uL (ref 150–400)
RBC: 3.01 MIL/uL — ABNORMAL LOW (ref 4.22–5.81)
RDW: 17.4 % — ABNORMAL HIGH (ref 11.5–15.5)
WBC: 5.5 10*3/uL (ref 4.0–10.5)
nRBC: 0 % (ref 0.0–0.2)

## 2019-12-26 LAB — COMPREHENSIVE METABOLIC PANEL
ALT: 20 U/L (ref 0–44)
AST: 22 U/L (ref 15–41)
Albumin: 3.5 g/dL (ref 3.5–5.0)
Alkaline Phosphatase: 71 U/L (ref 38–126)
Anion gap: 6 (ref 5–15)
BUN: 24 mg/dL — ABNORMAL HIGH (ref 8–23)
CO2: 28 mmol/L (ref 22–32)
Calcium: 9 mg/dL (ref 8.9–10.3)
Chloride: 104 mmol/L (ref 98–111)
Creatinine, Ser: 1.3 mg/dL — ABNORMAL HIGH (ref 0.61–1.24)
GFR calc Af Amer: 56 mL/min — ABNORMAL LOW (ref >60)
GFR calc non Af Amer: 48 mL/min — ABNORMAL LOW (ref >60)
Glucose, Bld: 144 mg/dL — ABNORMAL HIGH (ref 70–99)
Potassium: 4 mmol/L (ref 3.5–5.1)
Sodium: 138 mmol/L (ref 135–145)
Total Bilirubin: 0.6 mg/dL (ref 0.3–1.2)
Total Protein: 6.7 g/dL (ref 6.5–8.1)

## 2019-12-26 LAB — POC OCCULT BLOOD, ED: Fecal Occult Bld: NEGATIVE

## 2019-12-26 LAB — LIPASE, BLOOD: Lipase: 25 U/L (ref 11–51)

## 2019-12-26 NOTE — ED Notes (Signed)
Pt placed into yellow non-slip socks, given yellow fall bracelet and has a fall alarm pad underneath him on stretcher due to being a high fall risk. Pt has call bell within reach.

## 2019-12-26 NOTE — ED Triage Notes (Signed)
Pt BIB [facility] from [facility] due to pt having a Hbg of 7.4. Pt is from the memory care unit and is A&Ox2 at baseline. Pt has no c/o at this time. Pt has hx of GI bleed. Per facility, pt has no s/s of a GI bleed at this time.

## 2019-12-26 NOTE — ED Provider Notes (Signed)
[facility] DEPT Provider Note   CSN: [id number] Arrival date & time: 12/26/19  1953     History Chief Complaint  Patient presents with  . Abnormal Lab    Christian Bennett is a 86 y.o. male.  Patient sent in from [facility] by EMS for hemoglobin of 7.4.  Patient is the memory care unit.  He is at baseline according to the facility.  He has had a history of upper GI bleed in the past.  According to the facility's been no signs of GI bleed at this time.  Patient was admitted February 7 through February 10 had a blood transfusion done on February 11.  Was readmitted on February 12 through February 15.  On February 15 patient's hemoglobin was 7.6.  Apparently patient is normally around 8.  Patient on the second admission also had acute kidney injury.  Patient's past medical history is significant for hypertension the upper GI bleed.  And not chronic renal failure.  Patient also had a stroke in October 2020.        Past Medical History:  Diagnosis Date  . Acid reflux   . Carotid artery occlusion   . Hypertension   . Hypothyroidism   . Thyroid disease     Patient Active Problem List   Diagnosis Date Noted  . Esophagitis 11/16/2019  . AKI (acute kidney injury) ([place]) 11/16/2019  . CKD (chronic kidney disease), stage III 11/13/2019  . Upper GI bleed 11/12/2019  . GI bleeding 11/11/2019  . Cognitive dysfunction 10/25/2019  . History of CVA (cerebrovascular accident) 10/25/2019  . Anemia 10/25/2019  . Hyperlipidemia 09/26/2019  . Essential hypertension 09/26/2019  . TIA due to embolism ([place]) 09/26/2019  . H/O carotid endarterectomy 09/26/2019  . Carotid artery stenosis, symptomatic, right 07/25/2019  . CVA (cerebral vascular accident) ([place]) 07/11/2019  . TIA (transient ischemic attack) 07/10/2019  . Adjustment disorder with mixed anxiety and depressed mood 06/20/2018  . Vitamin D insufficiency 06/20/2018  . History of anemia 06/20/2018  .  Medically noncompliant-   does not want to go for hearing eval 06/20/2018  . Bilateral impacted cerumen 02/07/2018  . Hearing difficulty of both ears 02/03/2018  . Environmental and seasonal allergies 02/03/2018  . Eustachian tube dysfunction, bilateral 02/03/2018  . History of vitamin D deficiency 01/30/2018  . Contact dermatitis and eczema 10/19/2017  . HTN (hypertension) 06/22/2017  . GERD (gastroesophageal reflux disease) 06/22/2017  . Hx of colonoscopy 06/22/2017  . Mood disorder ([place]) 06/22/2017  . Hypothyroidism 06/22/2017  . Chronic pancreatitis ([place]) 06/22/2017    Past Surgical History:  Procedure Laterality Date  . CAROTID ENDARTERECTOMY Right 07/25/2019  . COLONOSCOPY    . ENDARTERECTOMY Right 07/25/2019   Procedure: RIGHT ENDARTERECTOMY CAROTID with patch angioplasty;  Surgeon: Rosetta Posner, MD;  Location: [facility];  Service: Vascular;  Laterality: Right;  . ESOPHAGOGASTRODUODENOSCOPY    . ESOPHAGOGASTRODUODENOSCOPY Left 11/13/2019   Procedure: ESOPHAGOGASTRODUODENOSCOPY (EGD);  Surgeon: Carol Ada, MD;  Location: [facility];  Service: Endoscopy;  Laterality: Left;  . EYE SURGERY Bilateral    cataracts  . INGUINAL HERNIA REPAIR    . LIPOMA EXCISION    . MELANOMA EXCISION    . REPLACEMENT TOTAL KNEE BILATERAL         Family History  Problem Relation Age of Onset  . Stroke Mother   . Stroke Father     Social History   Tobacco Use  . Smoking status: Former Smoker    Quit date:  1953    Years since quitting: 60.2  . Smokeless tobacco: Former Systems developer    Types: Chew  Substance Use Topics  . Alcohol use: Yes    Comment: 2oz Vodka a day - family regulates daily  . Drug use: No    Home Medications Prior to Admission medications   Medication Sig Start Date End Date Taking? Authorizing Provider  amLODipine (NORVASC) 5 MG tablet Take 1 tablet (5 mg total) by mouth daily. 11/14/19  Yes Rai, Ripudeep K, MD  aspirin EC 81 MG tablet Take 81 mg by mouth daily.   Yes  [provider]  atorvastatin (LIPITOR) 40 MG tablet Take 1 tablet (40 mg total) by mouth daily at 6 PM. No more rfs-->  Future Refills per long term care facility pt is entering jan 25-27, 2021 10/25/19  Yes Opalski, Deborah, DO  divalproex (DEPAKOTE) 125 MG DR tablet Take 1 tablet (125 mg total) by mouth daily. Patient taking differently: Take 125 mg by mouth 3 (three) times daily.  10/01/19  Yes McCue, Janett Billow, NP  fluticasone (FLONASE) 50 MCG/ACT nasal spray Place 2 sprays into both nostrils daily as needed for allergies or rhinitis.   Yes [provider]  iron polysaccharides (NIFEREX) 150 MG capsule Take 1 capsule (150 mg total) by mouth daily. 11/14/19  Yes Rai, Ripudeep K, MD  lipase/protease/amylase (CREON) 36000 UNITS CPEP capsule Take 36,000 Units by mouth 3 (three) times daily with meals.   Yes [provider]  LORazepam (ATIVAN) 0.5 MG tablet Take 0.25 mg by mouth 2 (two) times daily as needed for anxiety.   Yes [provider]  magnesium oxide (MAG-OX) 400 (241.3 Mg) MG tablet Take 1 tablet (400 mg total) by mouth daily. 07/12/19  Yes Vann, Jessica U, DO  melatonin 5 MG TABS Take 5 mg by mouth at bedtime.   Yes [provider]  omeprazole-sodium bicarbonate (ZEGERID) 40-1100 MG capsule Take 1 capsule by mouth 2 (two) times daily before a meal. Transition to once daily after 1 month 11/19/19 01/18/20 Yes Elodia Florence., MD  ondansetron (ZOFRAN) 4 MG tablet Take 4 mg by mouth daily as needed for nausea or vomiting.   Yes [provider]  QUEtiapine (SEROQUEL) 25 MG tablet Take 0.5 tablets (12.5 mg total) by mouth at bedtime. Patient taking differently: Take 25 mg by mouth at bedtime.  11/14/19  Yes Rai, Ripudeep K, MD  risperiDONE (RISPERDAL) 0.25 MG tablet Take 0.25 mg by mouth in the morning, at noon, and at bedtime.   Yes [provider]  senna (SENOKOT) 8.6 MG TABS tablet Take 1 tablet (8.6 mg total) by mouth 2 (two)  times daily. 11/14/19  Yes Rai, Ripudeep K, MD  sertraline (ZOLOFT) 25 MG tablet Take 1 tablet (25 mg total) by mouth daily. No more rfs-->  Future Refills per long term care facility pt is entering jan 25-27, 2021 10/25/19  Yes Opalski, Neoma Laming, DO  Skin Protectants, Misc. (EUCERIN) cream APPLY TWICE DAILY AS NEEDED FOR RASH OR ITCHING Patient taking differently: Apply 1 application topically every 12 (twelve) hours as needed (rash or itching).  12/04/18  Yes Opalski, Neoma Laming, DO  thyroid (NP THYROID) 60 MG tablet TAKE 1 TABLET BY MOUTH ONCE DAILY BEFORE BREAKFAST  No more rfs-->  Future Refills per long term care facility pt is entering jan 25-27, 2021 Patient taking differently: Take 60 mg by mouth daily before breakfast.  10/25/19  Yes Opalski, Neoma Laming, DO  triamcinolone cream (KENALOG) 0.1 %  Apply 1 application topically as needed (rash).   Yes [provider]  Vitamin D, Ergocalciferol, (DRISDOL) 1.25 MG (50000 UNIT) CAPS capsule Take 1 capsule (50,000 Units total) by mouth every 7 (seven) days. No more rfs-->  Future Refills per long term care facility pt is entering jan 25-27, 2021 10/25/19  Yes Opalski, Neoma Laming, DO    Allergies    Doxycycline and Eggs or egg-derived products  Review of Systems   Review of Systems  Unable to perform ROS: Dementia    Physical Exam Updated Vital Signs BP (!) 133/54   Pulse 79   Temp 98.9 F (37.2 C)   Resp 14   SpO2 94%   Physical Exam Vitals and nursing note reviewed.  Constitutional:      Appearance: Normal appearance. He is well-developed.  HENT:     Head: Normocephalic and atraumatic.  Eyes:     Extraocular Movements: Extraocular movements intact.     Conjunctiva/sclera: Conjunctivae normal.     Pupils: Pupils are equal, round, and reactive to light.  Cardiovascular:     Rate and Rhythm: Normal rate and regular rhythm.     Heart sounds: No murmur.  Pulmonary:     Effort: Pulmonary effort is normal. No respiratory distress.      Breath sounds: Normal breath sounds.  Abdominal:     General: There is no distension.     Palpations: Abdomen is soft.     Tenderness: There is no abdominal tenderness.  Genitourinary:    Rectum: Normal. Guaiac result negative.  Musculoskeletal:        General: No swelling.     Cervical back: Neck supple.  Skin:    General: Skin is warm and dry.     Capillary Refill: Capillary refill takes less than 2 seconds.  Neurological:     Mental Status: He is alert. Mental status is at baseline.     ED Results / Procedures / Treatments   Labs (all labs ordered are listed, but only abnormal results are displayed) Labs Reviewed  CBC WITH DIFFERENTIAL/PLATELET - Abnormal; Notable for the following components:      Result Value   RBC 3.01 (*)    Hemoglobin 7.9 (*)    HCT 26.2 (*)    RDW 17.4 (*)    All other components within normal limits  COMPREHENSIVE METABOLIC PANEL - Abnormal; Notable for the following components:   Glucose, Bld 144 (*)    BUN 24 (*)    Creatinine, Ser 1.30 (*)    GFR calc non Af Amer 48 (*)    GFR calc Af Amer 56 (*)    All other components within normal limits  LIPASE, BLOOD  POC OCCULT BLOOD, ED  TYPE AND SCREEN    EKG None  Radiology No results found.  Procedures Procedures (including critical care time)  Medications Ordered in ED Medications - No data to display  ED Course  I have reviewed the triage vital signs and the nursing notes.  Pertinent labs & imaging results that were available during my care of the patient were reviewed by me and considered in my medical decision making (see chart for details).    MDM Rules/Calculators/A&P                     Patient mentally seems to be baseline.  Stool was Hemoccult negative.  Vital signs stable afebrile heart rate around 78.  Blood pressure 138/69.  Oxygen saturations 99%.  Respirations 13.  Patient's abdomen soft and nontender.  Hemoglobin here tonight was 7.9.  His BUN and creatinine and GFR  baseline for him.  No acute lab abnormalities.  With a Hemoccult being negative feel that blood transfusion is 7.9 not indicated.  But needs to have his hemoglobin followed carefully.  Patient stable for discharge back to [facility].  Final Clinical Impression(s) / ED Diagnoses Final diagnoses:  Abnormal laboratory test    Rx / DC Orders ED Discharge Orders    None       Fredia Sorrow, MD 12/26/19 2245

## 2019-12-26 NOTE — ED Notes (Signed)
Attempted to call report to [facility]. Message left for facility to call back. [facility] called for transport.

## 2019-12-26 NOTE — Discharge Instructions (Addendum)
Hemoglobin here tonight was 7.9.  Stool Hemoccult was negative for any blood.  Other labs including his kidney function are baseline for him.  Not quite meeting threshold for blood transfusion.  Recommend that he have CBC rechecked in about 2 days.  Return for any new or worse symptoms.

## 2019-12-27 LAB — TYPE AND SCREEN
ABO/RH(D): O POS
Antibody Screen: POSITIVE
DAT, IgG: POSITIVE

## 2019-12-28 DIAGNOSIS — Z79899 Other long term (current) drug therapy: Secondary | ICD-10-CM | POA: Diagnosis not present

## 2019-12-28 DIAGNOSIS — I10 Essential (primary) hypertension: Secondary | ICD-10-CM | POA: Diagnosis not present

## 2019-12-28 DIAGNOSIS — K861 Other chronic pancreatitis: Secondary | ICD-10-CM | POA: Diagnosis not present

## 2019-12-31 DIAGNOSIS — R269 Unspecified abnormalities of gait and mobility: Secondary | ICD-10-CM | POA: Diagnosis not present

## 2019-12-31 DIAGNOSIS — K2101 Gastro-esophageal reflux disease with esophagitis, with bleeding: Secondary | ICD-10-CM | POA: Diagnosis not present

## 2019-12-31 DIAGNOSIS — F0391 Unspecified dementia with behavioral disturbance: Secondary | ICD-10-CM | POA: Diagnosis not present

## 2019-12-31 DIAGNOSIS — M810 Age-related osteoporosis without current pathological fracture: Secondary | ICD-10-CM | POA: Diagnosis not present

## 2019-12-31 DIAGNOSIS — D62 Acute posthemorrhagic anemia: Secondary | ICD-10-CM | POA: Diagnosis not present

## 2019-12-31 DIAGNOSIS — D649 Anemia, unspecified: Secondary | ICD-10-CM | POA: Diagnosis not present

## 2019-12-31 DIAGNOSIS — G309 Alzheimer's disease, unspecified: Secondary | ICD-10-CM | POA: Diagnosis not present

## 2019-12-31 DIAGNOSIS — I10 Essential (primary) hypertension: Secondary | ICD-10-CM | POA: Diagnosis not present

## 2019-12-31 DIAGNOSIS — I69398 Other sequelae of cerebral infarction: Secondary | ICD-10-CM | POA: Diagnosis not present

## 2020-01-07 ENCOUNTER — Emergency Department (HOSPITAL_COMMUNITY): Payer: Medicare Other

## 2020-01-07 ENCOUNTER — Emergency Department (HOSPITAL_COMMUNITY)
Admission: EM | Admit: 2020-01-07 | Discharge: 2020-01-07 | Disposition: A | Discharge status: Home / Self Care | Payer: Medicare Other | Attending: Emergency Medicine | Admitting: Emergency Medicine

## 2020-01-07 ENCOUNTER — Other Ambulatory Visit: Payer: Self-pay

## 2020-01-07 ENCOUNTER — Encounter (HOSPITAL_COMMUNITY): Payer: Self-pay

## 2020-01-07 DIAGNOSIS — I10 Essential (primary) hypertension: Secondary | ICD-10-CM | POA: Diagnosis not present

## 2020-01-07 DIAGNOSIS — R269 Unspecified abnormalities of gait and mobility: Secondary | ICD-10-CM | POA: Diagnosis not present

## 2020-01-07 DIAGNOSIS — W19XXXA Unspecified fall, initial encounter: Secondary | ICD-10-CM

## 2020-01-07 DIAGNOSIS — G309 Alzheimer's disease, unspecified: Secondary | ICD-10-CM | POA: Diagnosis not present

## 2020-01-07 DIAGNOSIS — R402411 Glasgow coma scale score 13-15, in the field [EMT or ambulance]: Secondary | ICD-10-CM | POA: Diagnosis not present

## 2020-01-07 DIAGNOSIS — Z8673 Personal history of transient ischemic attack (TIA), and cerebral infarction without residual deficits: Secondary | ICD-10-CM | POA: Diagnosis not present

## 2020-01-07 DIAGNOSIS — Z79899 Other long term (current) drug therapy: Secondary | ICD-10-CM | POA: Diagnosis not present

## 2020-01-07 DIAGNOSIS — Z87891 Personal history of nicotine dependence: Secondary | ICD-10-CM | POA: Diagnosis not present

## 2020-01-07 DIAGNOSIS — R279 Unspecified lack of coordination: Secondary | ICD-10-CM | POA: Diagnosis not present

## 2020-01-07 DIAGNOSIS — Z85828 Personal history of other malignant neoplasm of skin: Secondary | ICD-10-CM | POA: Insufficient documentation

## 2020-01-07 DIAGNOSIS — S0003XA Contusion of scalp, initial encounter: Secondary | ICD-10-CM | POA: Insufficient documentation

## 2020-01-07 DIAGNOSIS — I129 Hypertensive chronic kidney disease with stage 1 through stage 4 chronic kidney disease, or unspecified chronic kidney disease: Secondary | ICD-10-CM | POA: Insufficient documentation

## 2020-01-07 DIAGNOSIS — Z96653 Presence of artificial knee joint, bilateral: Secondary | ICD-10-CM | POA: Insufficient documentation

## 2020-01-07 DIAGNOSIS — M810 Age-related osteoporosis without current pathological fracture: Secondary | ICD-10-CM | POA: Diagnosis not present

## 2020-01-07 DIAGNOSIS — Y999 Unspecified external cause status: Secondary | ICD-10-CM | POA: Insufficient documentation

## 2020-01-07 DIAGNOSIS — I491 Atrial premature depolarization: Secondary | ICD-10-CM | POA: Diagnosis not present

## 2020-01-07 DIAGNOSIS — R404 Transient alteration of awareness: Secondary | ICD-10-CM | POA: Diagnosis not present

## 2020-01-07 DIAGNOSIS — W0110XA Fall on same level from slipping, tripping and stumbling with subsequent striking against unspecified object, initial encounter: Secondary | ICD-10-CM | POA: Insufficient documentation

## 2020-01-07 DIAGNOSIS — R001 Bradycardia, unspecified: Secondary | ICD-10-CM | POA: Diagnosis not present

## 2020-01-07 DIAGNOSIS — F0390 Unspecified dementia without behavioral disturbance: Secondary | ICD-10-CM | POA: Diagnosis not present

## 2020-01-07 DIAGNOSIS — R4020 Unspecified coma: Secondary | ICD-10-CM | POA: Diagnosis not present

## 2020-01-07 DIAGNOSIS — D62 Acute posthemorrhagic anemia: Secondary | ICD-10-CM | POA: Diagnosis not present

## 2020-01-07 DIAGNOSIS — Y92129 Unspecified place in nursing home as the place of occurrence of the external cause: Secondary | ICD-10-CM | POA: Insufficient documentation

## 2020-01-07 DIAGNOSIS — K2101 Gastro-esophageal reflux disease with esophagitis, with bleeding: Secondary | ICD-10-CM | POA: Diagnosis not present

## 2020-01-07 DIAGNOSIS — S0990XA Unspecified injury of head, initial encounter: Secondary | ICD-10-CM | POA: Diagnosis not present

## 2020-01-07 DIAGNOSIS — E039 Hypothyroidism, unspecified: Secondary | ICD-10-CM | POA: Insufficient documentation

## 2020-01-07 DIAGNOSIS — I69398 Other sequelae of cerebral infarction: Secondary | ICD-10-CM | POA: Diagnosis not present

## 2020-01-07 DIAGNOSIS — R410 Disorientation, unspecified: Secondary | ICD-10-CM | POA: Diagnosis not present

## 2020-01-07 DIAGNOSIS — Z743 Need for continuous supervision: Secondary | ICD-10-CM | POA: Diagnosis not present

## 2020-01-07 DIAGNOSIS — D649 Anemia, unspecified: Secondary | ICD-10-CM | POA: Diagnosis not present

## 2020-01-07 DIAGNOSIS — S199XXA Unspecified injury of neck, initial encounter: Secondary | ICD-10-CM | POA: Diagnosis not present

## 2020-01-07 DIAGNOSIS — F0391 Unspecified dementia with behavioral disturbance: Secondary | ICD-10-CM | POA: Diagnosis not present

## 2020-01-07 DIAGNOSIS — Y939 Activity, unspecified: Secondary | ICD-10-CM | POA: Diagnosis not present

## 2020-01-07 DIAGNOSIS — N183 Chronic kidney disease, stage 3 unspecified: Secondary | ICD-10-CM | POA: Insufficient documentation

## 2020-01-07 LAB — CBG MONITORING, ED: Glucose-Capillary: 105 mg/dL — ABNORMAL HIGH (ref 70–99)

## 2020-01-07 NOTE — ED Notes (Signed)
[facility] called  

## 2020-01-07 NOTE — Discharge Instructions (Signed)
Head and neck CT is reassuring without significant injury from fall.  Monitor for severe headache, changes in level of consciousness, abnormal behavior, vomiting or any other new or concerning symptoms.

## 2020-01-07 NOTE — ED Triage Notes (Signed)
Patient BIB EMS for mechanical fall. He reportedly fell backwards and hit the back of his head. Patient had a witnessed fall by staff. Patient is not reportedly on bloodthinners and is from [facility] Staff reports patient is at his baseline for cognition. Patient reports upper back and shoulder pain.

## 2020-01-07 NOTE — ED Provider Notes (Signed)
[facility] DEPT Provider Note   CSN: [id number] Arrival date & time: 01/07/20  1219     History Chief Complaint  Patient presents with  . Fall    Christian Bennett is a 86 y.o. male.  Christian Bennett is a 86 y.o. male with a history of hypertension, CAD, thyroid disease, dementia, who presents to the emergency department via EMS from memory care unit of [facility], where patient had a mechanical fall.  This was witnessed by nursing staff who state he slipped and fell backwards striking the back of his head.  He did not have any loss of consciousness and per facility he was at his baseline mental status after fall.  Patient is not on any blood thinners.  Patient complained of some upper back and neck pain per nursing staff.  Here in the ED patient is pleasantly demented and denies any pain currently denies any pain in his head or neck, denies pain in his back, no chest or abdominal pain and no pain in his extremities.  Level 5 caveat: Dementia        Past Medical History:  Diagnosis Date  . Acid reflux   . Carotid artery occlusion   . Hypertension   . Hypothyroidism   . Thyroid disease     Patient Active Problem List   Diagnosis Date Noted  . Esophagitis 11/16/2019  . AKI (acute kidney injury) ([place]) 11/16/2019  . CKD (chronic kidney disease), stage III 11/13/2019  . Upper GI bleed 11/12/2019  . GI bleeding 11/11/2019  . Cognitive dysfunction 10/25/2019  . History of CVA (cerebrovascular accident) 10/25/2019  . Anemia 10/25/2019  . Hyperlipidemia 09/26/2019  . Essential hypertension 09/26/2019  . TIA due to embolism ([place]) 09/26/2019  . H/O carotid endarterectomy 09/26/2019  . Carotid artery stenosis, symptomatic, right 07/25/2019  . CVA (cerebral vascular accident) ([place]) 07/11/2019  . TIA (transient ischemic attack) 07/10/2019  . Adjustment disorder with mixed anxiety and depressed mood 06/20/2018  . Vitamin D insufficiency  06/20/2018  . History of anemia 06/20/2018  . Medically noncompliant-   does not want to go for hearing eval 06/20/2018  . Bilateral impacted cerumen 02/07/2018  . Hearing difficulty of both ears 02/03/2018  . Environmental and seasonal allergies 02/03/2018  . Eustachian tube dysfunction, bilateral 02/03/2018  . History of vitamin D deficiency 01/30/2018  . Contact dermatitis and eczema 10/19/2017  . HTN (hypertension) 06/22/2017  . GERD (gastroesophageal reflux disease) 06/22/2017  . Hx of colonoscopy 06/22/2017  . Mood disorder ([place]) 06/22/2017  . Hypothyroidism 06/22/2017  . Chronic pancreatitis ([place]) 06/22/2017    Past Surgical History:  Procedure Laterality Date  . CAROTID ENDARTERECTOMY Right 07/25/2019  . COLONOSCOPY    . ENDARTERECTOMY Right 07/25/2019   Procedure: RIGHT ENDARTERECTOMY CAROTID with patch angioplasty;  Surgeon: Rosetta Posner, MD;  Location: [facility];  Service: Vascular;  Laterality: Right;  . ESOPHAGOGASTRODUODENOSCOPY    . ESOPHAGOGASTRODUODENOSCOPY Left 11/13/2019   Procedure: ESOPHAGOGASTRODUODENOSCOPY (EGD);  Surgeon: Carol Ada, MD;  Location: [facility];  Service: Endoscopy;  Laterality: Left;  . EYE SURGERY Bilateral    cataracts  . INGUINAL HERNIA REPAIR    . LIPOMA EXCISION    . MELANOMA EXCISION    . REPLACEMENT TOTAL KNEE BILATERAL         Family History  Problem Relation Age of Onset  . Stroke Mother   . Stroke Father     Social History   Tobacco Use  . Smoking status:  Former Smoker    Quit date: 1953    Years since quitting: 68.3  . Smokeless tobacco: Former Systems developer    Types: Chew  Substance Use Topics  . Alcohol use: Yes    Comment: 2oz Vodka a day - family regulates daily  . Drug use: No    Home Medications Prior to Admission medications   Medication Sig Start Date End Date Taking? Authorizing Provider  amLODipine (NORVASC) 5 MG tablet Take 1 tablet (5 mg total) by mouth daily. 11/14/19  Yes Rai, Ripudeep K, MD    atorvastatin (LIPITOR) 40 MG tablet Take 1 tablet (40 mg total) by mouth daily at 6 PM. No more rfs-->  Future Refills per long term care facility pt is entering jan 25-27, 2021 10/25/19  Yes Opalski, Deborah, DO  divalproex (DEPAKOTE) 125 MG DR tablet Take 1 tablet (125 mg total) by mouth daily. Patient taking differently: Take 125 mg by mouth 3 (three) times daily.  10/01/19  Yes McCue, Janett Billow, NP  fluticasone (FLONASE) 50 MCG/ACT nasal spray Place 2 sprays into both nostrils daily as needed for allergies or rhinitis.   Yes [provider]  iron polysaccharides (NIFEREX) 150 MG capsule Take 1 capsule (150 mg total) by mouth daily. 11/14/19  Yes Rai, Ripudeep K, MD  lipase/protease/amylase (CREON) 36000 UNITS CPEP capsule Take 36,000 Units by mouth 3 (three) times daily with meals.   Yes [provider]  LORazepam (ATIVAN) 0.5 MG tablet Take 0.25 mg by mouth 2 (two) times daily as needed for anxiety.   Yes [provider]  magnesium oxide (MAG-OX) 400 (241.3 Mg) MG tablet Take 1 tablet (400 mg total) by mouth daily. 07/12/19  Yes Vann, Jessica U, DO  melatonin 5 MG TABS Take 10 mg by mouth at bedtime.    Yes [provider]  omeprazole-sodium bicarbonate (ZEGERID) 40-1100 MG capsule Take 1 capsule by mouth 2 (two) times daily before a meal. Transition to once daily after 1 month 11/19/19 01/18/20 Yes Elodia Florence., MD  ondansetron (ZOFRAN) 4 MG tablet Take 4 mg by mouth daily as needed for nausea or vomiting.   Yes [provider]  QUEtiapine (SEROQUEL) 25 MG tablet Take 0.5 tablets (12.5 mg total) by mouth at bedtime. Patient taking differently: Take 25 mg by mouth at bedtime.  11/14/19  Yes Rai, Ripudeep K, MD  risperiDONE (RISPERDAL) 0.25 MG tablet Take 0.25 mg by mouth in the morning, at noon, and at bedtime.   Yes [provider]  senna (SENOKOT) 8.6 MG TABS tablet Take 1 tablet (8.6 mg total) by mouth 2 (two) times daily. 11/14/19  Yes  Rai, Ripudeep K, MD  sertraline (ZOLOFT) 25 MG tablet Take 1 tablet (25 mg total) by mouth daily. No more rfs-->  Future Refills per long term care facility pt is entering jan 25-27, 2021 Patient taking differently: Take 25 mg by mouth daily.  10/25/19  Yes Opalski, Neoma Laming, DO  Skin Protectants, Misc. (EUCERIN) cream APPLY TWICE DAILY AS NEEDED FOR RASH OR ITCHING Patient taking differently: Apply 1 application topically every 12 (twelve) hours as needed (rash or itching).  12/04/18  Yes Opalski, Neoma Laming, DO  thyroid (NP THYROID) 60 MG tablet TAKE 1 TABLET BY MOUTH ONCE DAILY BEFORE BREAKFAST  No more rfs-->  Future Refills per long term care facility pt is entering jan 25-27, 2021 Patient taking differently: Take 60 mg by mouth daily before breakfast.  10/25/19  Yes Opalski, Neoma Laming, DO  triamcinolone cream (KENALOG)  0.1 % Apply 1 application topically as needed (rash).   Yes [provider]  Vitamin D, Ergocalciferol, (DRISDOL) 1.25 MG (50000 UNIT) CAPS capsule Take 1 capsule (50,000 Units total) by mouth every 7 (seven) days. No more rfs-->  Future Refills per long term care facility pt is entering jan 25-27, 2021 Patient taking differently: Take 50,000 Units by mouth every 7 (seven) days.  10/25/19  Yes Opalski, Deborah, DO    Allergies    Doxycycline and Eggs or egg-derived products  Review of Systems   Review of Systems  Unable to perform ROS: Dementia    Physical Exam Updated Vital Signs BP (!) 152/66   Pulse 62   Temp (!) 97.4 F (36.3 C) (Oral)   Resp 12   SpO2 100%   Physical Exam Vitals and nursing note reviewed.  Constitutional:      General: He is not in acute distress.    Appearance: Normal appearance. He is well-developed and normal weight. He is not ill-appearing or diaphoretic.     Comments: Alert, pleasantly demented  HENT:     Head: Normocephalic.     Comments: Posterior scalp hematoma, no lacerations or bleeding, no surrounding step-off or deformity,  negative battle sign Eyes:     General:        Right eye: No discharge.        Left eye: No discharge.     Extraocular Movements: Extraocular movements intact.     Pupils: Pupils are equal, round, and reactive to light.  Neck:     Comments: C-collar in place but no midline C-spine tenderness. Cardiovascular:     Rate and Rhythm: Normal rate and regular rhythm.     Heart sounds: Normal heart sounds. No murmur. No friction rub. No gallop.   Pulmonary:     Effort: Pulmonary effort is normal. No respiratory distress.     Breath sounds: Normal breath sounds. No wheezing or rales.     Comments: Respirations equal and unlabored, patient able to speak in full sentences, lungs clear to auscultation bilaterally. Chest wall NTTP Chest:     Chest wall: No tenderness.  Abdominal:     General: Bowel sounds are normal. There is no distension.     Palpations: Abdomen is soft. There is no mass.     Tenderness: There is no abdominal tenderness. There is no guarding.     Comments: Abdomen soft, nondistended, nontender to palpation in all quadrants without guarding or peritoneal signs  Musculoskeletal:        General: No deformity.     Cervical back: Neck supple.     Comments: T-spine and L-spine nontender to palpation at midline. Patient moves all extremities without difficulty. All joints supple and easily movable, no erythema, swelling or palpable deformity, all compartments soft.   Skin:    General: Skin is warm and dry.     Capillary Refill: Capillary refill takes less than 2 seconds.  Neurological:     Mental Status: He is alert.     Coordination: Coordination normal.     Comments: Speech is clear, able to follow commands CN III-XII intact Normal strength in upper and lower extremities bilaterally including dorsiflexion and plantar flexion, strong and equal grip strength Sensation normal to light and sharp touch Moves extremities without ataxia, coordination intact  Psychiatric:         Mood and Affect: Mood normal.        Behavior: Behavior normal.     ED  Results / Procedures / Treatments   Labs (all labs ordered are listed, but only abnormal results are displayed) Labs Reviewed  CBG MONITORING, ED - Abnormal; Notable for the following components:      Result Value   Glucose-Capillary 105 (*)    All other components within normal limits    EKG None  Radiology CT Head Wo Contrast  Result Date: 01/07/2020 CLINICAL DATA:  [place] with trauma to the back of the head. EXAM: CT HEAD WITHOUT CONTRAST CT CERVICAL SPINE WITHOUT CONTRAST TECHNIQUE: Multidetector CT imaging of the head and cervical spine was performed following the standard protocol without intravenous contrast. Multiplanar CT image reconstructions of the cervical spine were also generated. COMPARISON:  MRI 07/10/2019.  CT 10/09/2019 FINDINGS: CT HEAD FINDINGS Brain: Generalized atrophy. Chronic small-vessel ischemic changes throughout the brain. Old right occipital infarction. Old right frontoparietal vertex infarction. No mass lesion, hemorrhage, hydrocephalus or extra-axial collection. Vascular: There is atherosclerotic calcification of the major vessels at the base of the brain. Skull: No skull fracture. Sinuses/Orbits: No acute sinus disease. Mucosal thickening of the right maxillary sinus. Other: None CT CERVICAL SPINE FINDINGS Alignment: Normal Skull base and vertebrae: No traumatic finding. Chronic ankylosis from C3 through C7. Soft tissues and spinal canal: Negative Disc levels: Ordinary degenerative change at the C1-2 articulation. C2-3 shows facet arthropathy on the right with right foraminal stenosis. Wide patency of the canal and foramina from C3 through C7. C7-T1 spondylosis without significant canal or foraminal stenosis. Upper chest: Benign pleural and parenchymal scarring. Other: None IMPRESSION: No acute or traumatic finding. Brain atrophy, chronic small-vessel ischemic change of the white matter and old  cortical infarctions of the right occipital and right frontoparietal region. Chronic cervical ankylosis C3 through C7. No malalignment, fracture or soft tissue swelling. Electronically Signed   By: Nelson Chimes M.D.   On: 01/07/2020 14:35   CT Cervical Spine Wo Contrast  Result Date: 01/07/2020 CLINICAL DATA:  [place] with trauma to the back of the head. EXAM: CT HEAD WITHOUT CONTRAST CT CERVICAL SPINE WITHOUT CONTRAST TECHNIQUE: Multidetector CT imaging of the head and cervical spine was performed following the standard protocol without intravenous contrast. Multiplanar CT image reconstructions of the cervical spine were also generated. COMPARISON:  MRI 07/10/2019.  CT 10/09/2019 FINDINGS: CT HEAD FINDINGS Brain: Generalized atrophy. Chronic small-vessel ischemic changes throughout the brain. Old right occipital infarction. Old right frontoparietal vertex infarction. No mass lesion, hemorrhage, hydrocephalus or extra-axial collection. Vascular: There is atherosclerotic calcification of the major vessels at the base of the brain. Skull: No skull fracture. Sinuses/Orbits: No acute sinus disease. Mucosal thickening of the right maxillary sinus. Other: None CT CERVICAL SPINE FINDINGS Alignment: Normal Skull base and vertebrae: No traumatic finding. Chronic ankylosis from C3 through C7. Soft tissues and spinal canal: Negative Disc levels: Ordinary degenerative change at the C1-2 articulation. C2-3 shows facet arthropathy on the right with right foraminal stenosis. Wide patency of the canal and foramina from C3 through C7. C7-T1 spondylosis without significant canal or foraminal stenosis. Upper chest: Benign pleural and parenchymal scarring. Other: None IMPRESSION: No acute or traumatic finding. Brain atrophy, chronic small-vessel ischemic change of the white matter and old cortical infarctions of the right occipital and right frontoparietal region. Chronic cervical ankylosis C3 through C7. No malalignment, fracture or  soft tissue swelling. Electronically Signed   By: Nelson Chimes M.D.   On: 01/07/2020 14:35    Procedures Procedures (including critical care time)  Medications Ordered in ED Medications - No  data to display  ED Course  I have reviewed the triage vital signs and the nursing notes.  Pertinent labs & imaging results that were available during my care of the patient were reviewed by me and considered in my medical decision making (see chart for details).    MDM Rules/Calculators/A&P                     86 year old male presents after witnessed fall at skilled nursing facility.  Patient not on blood thinners.  He arrives with c-collar in place, hematoma to the back of the head, no LOC and no neurologic deficits.  Denies pain elsewhere, no signs of trauma on exam.  Mildly hypertensive but vitals otherwise normal.  Will get CT of the head and neck.  CT scans reassuring with no evidence of acute intracranial injury and no traumatic fracture or malalignment of the cervical spine.  Patient continues to deny any complaints of pain.  C-collar removed.  At this time patient is stable for discharge back to facility. [facility] called for transport.  Final Clinical Impression(s) / ED Diagnoses Final diagnoses:  Fall, initial encounter  Injury of head, initial encounter    Rx / DC Orders ED Discharge Orders    None       Jacqlyn Larsen, [place] 01/07/20 1452    Sherwood Gambler, MD 01/08/20 [phone]

## 2020-01-08 DIAGNOSIS — Z7982 Long term (current) use of aspirin: Secondary | ICD-10-CM | POA: Diagnosis not present

## 2020-01-08 DIAGNOSIS — F0281 Dementia in other diseases classified elsewhere with behavioral disturbance: Secondary | ICD-10-CM | POA: Diagnosis not present

## 2020-01-08 DIAGNOSIS — E782 Mixed hyperlipidemia: Secondary | ICD-10-CM | POA: Diagnosis not present

## 2020-01-08 DIAGNOSIS — E039 Hypothyroidism, unspecified: Secondary | ICD-10-CM | POA: Diagnosis not present

## 2020-01-08 DIAGNOSIS — N183 Chronic kidney disease, stage 3 unspecified: Secondary | ICD-10-CM | POA: Diagnosis not present

## 2020-01-08 DIAGNOSIS — F4323 Adjustment disorder with mixed anxiety and depressed mood: Secondary | ICD-10-CM | POA: Diagnosis not present

## 2020-01-08 DIAGNOSIS — I129 Hypertensive chronic kidney disease with stage 1 through stage 4 chronic kidney disease, or unspecified chronic kidney disease: Secondary | ICD-10-CM | POA: Diagnosis not present

## 2020-01-08 DIAGNOSIS — G309 Alzheimer's disease, unspecified: Secondary | ICD-10-CM | POA: Diagnosis not present

## 2020-01-08 DIAGNOSIS — M810 Age-related osteoporosis without current pathological fracture: Secondary | ICD-10-CM | POA: Diagnosis not present

## 2020-01-08 DIAGNOSIS — Z9183 Wandering in diseases classified elsewhere: Secondary | ICD-10-CM | POA: Diagnosis not present

## 2020-01-08 DIAGNOSIS — R269 Unspecified abnormalities of gait and mobility: Secondary | ICD-10-CM | POA: Diagnosis not present

## 2020-01-08 DIAGNOSIS — I69398 Other sequelae of cerebral infarction: Secondary | ICD-10-CM | POA: Diagnosis not present

## 2020-01-08 DIAGNOSIS — Z8673 Personal history of transient ischemic attack (TIA), and cerebral infarction without residual deficits: Secondary | ICD-10-CM | POA: Diagnosis not present

## 2020-01-08 DIAGNOSIS — D62 Acute posthemorrhagic anemia: Secondary | ICD-10-CM | POA: Diagnosis not present

## 2020-01-08 DIAGNOSIS — Z66 Do not resuscitate: Secondary | ICD-10-CM | POA: Diagnosis not present

## 2020-01-08 DIAGNOSIS — D649 Anemia, unspecified: Secondary | ICD-10-CM | POA: Diagnosis not present

## 2020-01-09 DIAGNOSIS — I69398 Other sequelae of cerebral infarction: Secondary | ICD-10-CM | POA: Diagnosis not present

## 2020-01-09 DIAGNOSIS — M810 Age-related osteoporosis without current pathological fracture: Secondary | ICD-10-CM | POA: Diagnosis not present

## 2020-01-09 DIAGNOSIS — F0281 Dementia in other diseases classified elsewhere with behavioral disturbance: Secondary | ICD-10-CM | POA: Diagnosis not present

## 2020-01-09 DIAGNOSIS — G309 Alzheimer's disease, unspecified: Secondary | ICD-10-CM | POA: Diagnosis not present

## 2020-01-09 DIAGNOSIS — D62 Acute posthemorrhagic anemia: Secondary | ICD-10-CM | POA: Diagnosis not present

## 2020-01-09 DIAGNOSIS — R269 Unspecified abnormalities of gait and mobility: Secondary | ICD-10-CM | POA: Diagnosis not present

## 2020-01-10 DIAGNOSIS — M542 Cervicalgia: Secondary | ICD-10-CM | POA: Diagnosis not present

## 2020-01-10 DIAGNOSIS — M545 Low back pain: Secondary | ICD-10-CM | POA: Diagnosis not present

## 2020-01-10 DIAGNOSIS — W19XXXA Unspecified fall, initial encounter: Secondary | ICD-10-CM | POA: Diagnosis not present

## 2020-01-11 DIAGNOSIS — F331 Major depressive disorder, recurrent, moderate: Secondary | ICD-10-CM | POA: Diagnosis not present

## 2020-01-11 DIAGNOSIS — F0151 Vascular dementia with behavioral disturbance: Secondary | ICD-10-CM | POA: Diagnosis not present

## 2020-01-13 ENCOUNTER — Emergency Department (HOSPITAL_COMMUNITY): Payer: Medicare Other

## 2020-01-13 ENCOUNTER — Emergency Department (HOSPITAL_COMMUNITY)
Admission: EM | Admit: 2020-01-13 | Discharge: 2020-01-13 | Disposition: A | Discharge status: Home / Self Care | Payer: Medicare Other | Attending: Emergency Medicine | Admitting: Emergency Medicine

## 2020-01-13 ENCOUNTER — Other Ambulatory Visit: Payer: Self-pay

## 2020-01-13 DIAGNOSIS — Y998 Other external cause status: Secondary | ICD-10-CM | POA: Diagnosis not present

## 2020-01-13 DIAGNOSIS — Z87891 Personal history of nicotine dependence: Secondary | ICD-10-CM | POA: Insufficient documentation

## 2020-01-13 DIAGNOSIS — S0990XA Unspecified injury of head, initial encounter: Secondary | ICD-10-CM | POA: Diagnosis not present

## 2020-01-13 DIAGNOSIS — E039 Hypothyroidism, unspecified: Secondary | ICD-10-CM | POA: Diagnosis not present

## 2020-01-13 DIAGNOSIS — Y9389 Activity, other specified: Secondary | ICD-10-CM | POA: Insufficient documentation

## 2020-01-13 DIAGNOSIS — Z79899 Other long term (current) drug therapy: Secondary | ICD-10-CM | POA: Insufficient documentation

## 2020-01-13 DIAGNOSIS — F0390 Unspecified dementia without behavioral disturbance: Secondary | ICD-10-CM | POA: Insufficient documentation

## 2020-01-13 DIAGNOSIS — I129 Hypertensive chronic kidney disease with stage 1 through stage 4 chronic kidney disease, or unspecified chronic kidney disease: Secondary | ICD-10-CM | POA: Insufficient documentation

## 2020-01-13 DIAGNOSIS — W19XXXA Unspecified fall, initial encounter: Secondary | ICD-10-CM | POA: Insufficient documentation

## 2020-01-13 DIAGNOSIS — Z7401 Bed confinement status: Secondary | ICD-10-CM | POA: Diagnosis not present

## 2020-01-13 DIAGNOSIS — N183 Chronic kidney disease, stage 3 unspecified: Secondary | ICD-10-CM | POA: Insufficient documentation

## 2020-01-13 DIAGNOSIS — R0902 Hypoxemia: Secondary | ICD-10-CM | POA: Diagnosis not present

## 2020-01-13 DIAGNOSIS — Z8673 Personal history of transient ischemic attack (TIA), and cerebral infarction without residual deficits: Secondary | ICD-10-CM | POA: Insufficient documentation

## 2020-01-13 DIAGNOSIS — R404 Transient alteration of awareness: Secondary | ICD-10-CM | POA: Diagnosis not present

## 2020-01-13 DIAGNOSIS — Y92129 Unspecified place in nursing home as the place of occurrence of the external cause: Secondary | ICD-10-CM | POA: Diagnosis not present

## 2020-01-13 DIAGNOSIS — S199XXA Unspecified injury of neck, initial encounter: Secondary | ICD-10-CM | POA: Diagnosis not present

## 2020-01-13 DIAGNOSIS — R4182 Altered mental status, unspecified: Secondary | ICD-10-CM | POA: Diagnosis not present

## 2020-01-13 DIAGNOSIS — Z043 Encounter for examination and observation following other accident: Secondary | ICD-10-CM | POA: Insufficient documentation

## 2020-01-13 DIAGNOSIS — M2550 Pain in unspecified joint: Secondary | ICD-10-CM | POA: Diagnosis not present

## 2020-01-13 DIAGNOSIS — I959 Hypotension, unspecified: Secondary | ICD-10-CM | POA: Diagnosis not present

## 2020-01-13 NOTE — ED Notes (Signed)
Transported home buy [facility]. Facility and pt verbalized understanding of DC instructions

## 2020-01-13 NOTE — ED Notes (Addendum)
[facility] called. Paperwork at bedside. Called facility for report gave report to Barron Schmid

## 2020-01-13 NOTE — Discharge Instructions (Signed)
As discussed, your evaluation today has been largely reassuring.  But, it is important that you monitor your condition carefully, and do not hesitate to return to the ED if you develop new, or concerning changes in your condition. ? ?Otherwise, please follow-up with your physician for appropriate ongoing care. ? ?

## 2020-01-13 NOTE — ED Triage Notes (Signed)
Pt arrived via [facility] from [facility] Witnessed Mechanical Fall from standing position. Pt denies head neck or back pain. Cervical collar in place. Pt is not on blood thinners. Per EMS A/OX1 at baseline    Hx Stroke aphasia, HTN, mild cognitive impairment.

## 2020-01-13 NOTE — ED Provider Notes (Signed)
[facility] DEPT Provider Note   CSN: [id number] Arrival date & time: 01/13/20  1850     History Chief Complaint  Patient presents with  . Fall    Delane Crehan is a 86 y.o. male.  HPI   Patient with dementia, level 5 caveat presents from nursing facility after a fall.  The patient's fall may have been witnessed, but per EMS report staff described the event as patient being found on the ground when one of the staff members turned around.  He was otherwise in his usual self prior to the event, and subsequently was noted to be interacting in a typical manner, which is alert, oriented x1 only. Patient self cannot provide any details of the event.  When asked specifically about pain, and evaluated, by palpating certain body points he denies pain anywhere.  However, history is again, limited secondary to his dementia.  Past Medical History:  Diagnosis Date  . Acid reflux   . Carotid artery occlusion   . Hypertension   . Hypothyroidism   . Thyroid disease     Patient Active Problem List   Diagnosis Date Noted  . Esophagitis 11/16/2019  . AKI (acute kidney injury) ([place]) 11/16/2019  . CKD (chronic kidney disease), stage III 11/13/2019  . Upper GI bleed 11/12/2019  . GI bleeding 11/11/2019  . Cognitive dysfunction 10/25/2019  . History of CVA (cerebrovascular accident) 10/25/2019  . Anemia 10/25/2019  . Hyperlipidemia 09/26/2019  . Essential hypertension 09/26/2019  . TIA due to embolism ([place]) 09/26/2019  . H/O carotid endarterectomy 09/26/2019  . Carotid artery stenosis, symptomatic, right 07/25/2019  . CVA (cerebral vascular accident) ([place]) 07/11/2019  . TIA (transient ischemic attack) 07/10/2019  . Adjustment disorder with mixed anxiety and depressed mood 06/20/2018  . Vitamin D insufficiency 06/20/2018  . History of anemia 06/20/2018  . Medically noncompliant-   does not want to go for hearing eval 06/20/2018  . Bilateral impacted cerumen  02/07/2018  . Hearing difficulty of both ears 02/03/2018  . Environmental and seasonal allergies 02/03/2018  . Eustachian tube dysfunction, bilateral 02/03/2018  . History of vitamin D deficiency 01/30/2018  . Contact dermatitis and eczema 10/19/2017  . HTN (hypertension) 06/22/2017  . GERD (gastroesophageal reflux disease) 06/22/2017  . Hx of colonoscopy 06/22/2017  . Mood disorder ([place]) 06/22/2017  . Hypothyroidism 06/22/2017  . Chronic pancreatitis ([place]) 06/22/2017    Past Surgical History:  Procedure Laterality Date  . CAROTID ENDARTERECTOMY Right 07/25/2019  . COLONOSCOPY    . ENDARTERECTOMY Right 07/25/2019   Procedure: RIGHT ENDARTERECTOMY CAROTID with patch angioplasty;  Surgeon: Rosetta Posner, MD;  Location: [facility];  Service: Vascular;  Laterality: Right;  . ESOPHAGOGASTRODUODENOSCOPY    . ESOPHAGOGASTRODUODENOSCOPY Left 11/13/2019   Procedure: ESOPHAGOGASTRODUODENOSCOPY (EGD);  Surgeon: Carol Ada, MD;  Location: [facility];  Service: Endoscopy;  Laterality: Left;  . EYE SURGERY Bilateral    cataracts  . INGUINAL HERNIA REPAIR    . LIPOMA EXCISION    . MELANOMA EXCISION    . REPLACEMENT TOTAL KNEE BILATERAL         Family History  Problem Relation Age of Onset  . Stroke Mother   . Stroke Father     Social History   Tobacco Use  . Smoking status: Former Smoker    Quit date: 1953    Years since quitting: 68.3  . Smokeless tobacco: Former Systems developer    Types: Chew  Substance Use Topics  . Alcohol use: Yes  Comment: Bernestine Amass a day - family regulates daily  . Drug use: No    Home Medications Prior to Admission medications   Medication Sig Start Date End Date Taking? Authorizing Provider  amLODipine (NORVASC) 5 MG tablet Take 1 tablet (5 mg total) by mouth daily. 11/14/19   Rai, Vernelle Emerald, MD  atorvastatin (LIPITOR) 40 MG tablet Take 1 tablet (40 mg total) by mouth daily at 6 PM. No more rfs-->  Future Refills per long term care facility pt is entering jan  25-27, 2021 10/25/19   Opalski, Deborah, DO  divalproex (DEPAKOTE) 125 MG DR tablet Take 1 tablet (125 mg total) by mouth daily. Patient taking differently: Take 125 mg by mouth 3 (three) times daily.  10/01/19   Frann Rider, NP  fluticasone (FLONASE) 50 MCG/ACT nasal spray Place 2 sprays into both nostrils daily as needed for allergies or rhinitis.    [provider]  iron polysaccharides (NIFEREX) 150 MG capsule Take 1 capsule (150 mg total) by mouth daily. 11/14/19   Rai, Vernelle Emerald, MD  lipase/protease/amylase (CREON) 36000 UNITS CPEP capsule Take 36,000 Units by mouth 3 (three) times daily with meals.    [provider]  LORazepam (ATIVAN) 0.5 MG tablet Take 0.25 mg by mouth 2 (two) times daily as needed for anxiety.    [provider]  magnesium oxide (MAG-OX) 400 (241.3 Mg) MG tablet Take 1 tablet (400 mg total) by mouth daily. 07/12/19   Eulogio Bear U, DO  melatonin 5 MG TABS Take 10 mg by mouth at bedtime.     [provider]  omeprazole-sodium bicarbonate (ZEGERID) 40-1100 MG capsule Take 1 capsule by mouth 2 (two) times daily before a meal. Transition to once daily after 1 month 11/19/19 01/18/20  Elodia Florence., MD  ondansetron (ZOFRAN) 4 MG tablet Take 4 mg by mouth daily as needed for nausea or vomiting.    [provider]  QUEtiapine (SEROQUEL) 25 MG tablet Take 0.5 tablets (12.5 mg total) by mouth at bedtime. Patient taking differently: Take 25 mg by mouth at bedtime.  11/14/19   Rai, Ripudeep K, MD  risperiDONE (RISPERDAL) 0.25 MG tablet Take 0.25 mg by mouth in the morning, at noon, and at bedtime.    [provider]  senna (SENOKOT) 8.6 MG TABS tablet Take 1 tablet (8.6 mg total) by mouth 2 (two) times daily. 11/14/19   Rai, Vernelle Emerald, MD  sertraline (ZOLOFT) 25 MG tablet Take 1 tablet (25 mg total) by mouth daily. No more rfs-->  Future Refills per long term care facility pt is entering jan 25-27, 2021 Patient taking  differently: Take 25 mg by mouth daily.  10/25/19   Mellody Dance, DO  Skin Protectants, Misc. (EUCERIN) cream APPLY TWICE DAILY AS NEEDED FOR RASH OR ITCHING Patient taking differently: Apply 1 application topically every 12 (twelve) hours as needed (rash or itching).  12/04/18   Mellody Dance, DO  thyroid (NP THYROID) 60 MG tablet TAKE 1 TABLET BY MOUTH ONCE DAILY BEFORE BREAKFAST  No more rfs-->  Future Refills per long term care facility pt is entering jan 25-27, 2021 Patient taking differently: Take 60 mg by mouth daily before breakfast.  10/25/19   Opalski, Neoma Laming, DO  triamcinolone cream (KENALOG) 0.1 % Apply 1 application topically as needed (rash).    [provider]  Vitamin D, Ergocalciferol, (DRISDOL) 1.25 MG (50000 UNIT) CAPS capsule Take 1 capsule (50,000 Units total) by mouth every 7 (seven) days. No  more rfs-->  Future Refills per long term care facility pt is entering jan 25-27, 2021 Patient taking differently: Take 50,000 Units by mouth every 7 (seven) days.  10/25/19   Mellody Dance, DO    Allergies    Doxycycline and Eggs or egg-derived products  Review of Systems   Review of Systems  Unable to perform ROS: Dementia    Physical Exam Updated Vital Signs BP 133/64   Pulse 69   Temp 98 F (36.7 C) (Axillary)   Resp 14   SpO2 98%   Physical Exam Vitals and nursing note reviewed.  Constitutional:      Appearance: He is well-developed. He is ill-appearing.     Comments: Ill, deconditioned appearing elderly male minimally interactive  HENT:     Head: Normocephalic and atraumatic.  Eyes:     Conjunctiva/sclera: Conjunctivae normal.  Neck:     Comments: Cervical collar in place, no gross deformity Cardiovascular:     Rate and Rhythm: Normal rate and regular rhythm.  Pulmonary:     Effort: Pulmonary effort is normal. No respiratory distress.     Breath sounds: No stridor.  Abdominal:     General: There is no distension.     Tenderness: There is  no abdominal tenderness. There is no guarding.  Musculoskeletal:     Comments: No gross deformities and the patient does move his extremities, when specifically palpated direct to move them, however, the patient cannot answer specific questions about pain in any area independently.  Pelvis is stable.  Skin:    General: Skin is warm and dry.  Neurological:     Mental Status: He is alert.     Motor: Weakness and atrophy present. No tremor.     Comments: Does not follow commands.  Face is symmetric, speech is minimal, brief, clear, largely single words.  Psychiatric:        Cognition and Memory: Cognition is impaired.     ED Results / Procedures / Treatments   Labs (all labs ordered are listed, but only abnormal results are displayed) Labs Reviewed - No data to display  EKG None  Radiology CT Head Wo Contrast  Result Date: 01/13/2020 CLINICAL DATA:  86 year old male with trauma. EXAM: CT HEAD WITHOUT CONTRAST CT CERVICAL SPINE WITHOUT CONTRAST TECHNIQUE: Multidetector CT imaging of the head and cervical spine was performed following the standard protocol without intravenous contrast. Multiplanar CT image reconstructions of the cervical spine were also generated. COMPARISON:  Head CT dated 01/07/2020. FINDINGS: CT HEAD FINDINGS Brain: There is moderate age-related atrophy and chronic microvascular ischemic changes. Old left basal ganglia infarct and encephalomalacia. There is no acute intracranial hemorrhage. No mass effect or midline shift. No extra-axial fluid collection. Vascular: No hyperdense vessel or unexpected calcification. Skull: Normal. Negative for fracture or focal lesion. Sinuses/Orbits: No acute finding. Other: None CT CERVICAL SPINE FINDINGS Alignment: No acute subluxation. Skull base and vertebrae: No acute fracture. Osteopenia. Soft tissues and spinal canal: No prevertebral fluid or swelling. No visible canal hematoma. Disc levels: Multilevel degenerative changes with endplate  irregularity and disc space narrowing. There is multilevel anterior bridging osteophyte most consistent with diffuse idiopathic skeletal hyperostosis. Upper chest: Biapical subpleural scarring. Other: Left carotid bulb calcified plaques. Postsurgical changes of right carotid endarterectomy. IMPRESSION: 1. No acute intracranial pathology. Moderate age-related atrophy and chronic microvascular ischemic changes. Old left basal ganglia infarct and encephalomalacia. 2. No acute/traumatic cervical spine pathology. Multilevel degenerative changes. Electronically Signed   By: Laren Everts.D.  On: 01/13/2020 20:36   CT Cervical Spine Wo Contrast  Result Date: 01/13/2020 CLINICAL DATA:  86 year old male with trauma. EXAM: CT HEAD WITHOUT CONTRAST CT CERVICAL SPINE WITHOUT CONTRAST TECHNIQUE: Multidetector CT imaging of the head and cervical spine was performed following the standard protocol without intravenous contrast. Multiplanar CT image reconstructions of the cervical spine were also generated. COMPARISON:  Head CT dated 01/07/2020. FINDINGS: CT HEAD FINDINGS Brain: There is moderate age-related atrophy and chronic microvascular ischemic changes. Old left basal ganglia infarct and encephalomalacia. There is no acute intracranial hemorrhage. No mass effect or midline shift. No extra-axial fluid collection. Vascular: No hyperdense vessel or unexpected calcification. Skull: Normal. Negative for fracture or focal lesion. Sinuses/Orbits: No acute finding. Other: None CT CERVICAL SPINE FINDINGS Alignment: No acute subluxation. Skull base and vertebrae: No acute fracture. Osteopenia. Soft tissues and spinal canal: No prevertebral fluid or swelling. No visible canal hematoma. Disc levels: Multilevel degenerative changes with endplate irregularity and disc space narrowing. There is multilevel anterior bridging osteophyte most consistent with diffuse idiopathic skeletal hyperostosis. Upper chest: Biapical subpleural  scarring. Other: Left carotid bulb calcified plaques. Postsurgical changes of right carotid endarterectomy. IMPRESSION: 1. No acute intracranial pathology. Moderate age-related atrophy and chronic microvascular ischemic changes. Old left basal ganglia infarct and encephalomalacia. 2. No acute/traumatic cervical spine pathology. Multilevel degenerative changes. Electronically Signed   By: Anner Crete M.D.   On: 01/13/2020 20:36    Procedures Procedures (including critical care time)  Medications Ordered in ED Medications - No data to display  ED Course  I have reviewed the triage vital signs and the nursing notes.  Pertinent labs & imaging results that were available during my care of the patient were reviewed by me and considered in my medical decision making (see chart for details).   With consideration of intracranial injury following a fall, or cervical spine injury, CT scans ordered.  Patient is hemodynamically unremarkable, and initial physical exam is somewhat reassuring, though again I noted secondary to the patient's history of dementia. MDM Rules/Calculators/A&P                      9:51 PM Patient in no distress, hemodynamically unremarkable, no new complaints as far as I can tell.  CTs reviewed, interpreted, no acute fracture, no intracranial hemorrhage.  Given the patient's description of being in a normal interactive state per nursing home staff, absence of acute findings here, absence of hemodynamic instability, he will return to his nursing facility. Final Clinical Impression(s) / ED Diagnoses Final diagnoses:  Fall, initial encounter     Carmin Muskrat, MD 01/13/20 2152

## 2020-01-14 DIAGNOSIS — W19XXXA Unspecified fall, initial encounter: Secondary | ICD-10-CM | POA: Diagnosis not present

## 2020-01-14 DIAGNOSIS — I69398 Other sequelae of cerebral infarction: Secondary | ICD-10-CM | POA: Diagnosis not present

## 2020-01-14 DIAGNOSIS — F0281 Dementia in other diseases classified elsewhere with behavioral disturbance: Secondary | ICD-10-CM | POA: Diagnosis not present

## 2020-01-14 DIAGNOSIS — M810 Age-related osteoporosis without current pathological fracture: Secondary | ICD-10-CM | POA: Diagnosis not present

## 2020-01-14 DIAGNOSIS — R269 Unspecified abnormalities of gait and mobility: Secondary | ICD-10-CM | POA: Diagnosis not present

## 2020-01-14 DIAGNOSIS — F0391 Unspecified dementia with behavioral disturbance: Secondary | ICD-10-CM | POA: Diagnosis not present

## 2020-01-14 DIAGNOSIS — I10 Essential (primary) hypertension: Secondary | ICD-10-CM | POA: Diagnosis not present

## 2020-01-14 DIAGNOSIS — G309 Alzheimer's disease, unspecified: Secondary | ICD-10-CM | POA: Diagnosis not present

## 2020-01-14 DIAGNOSIS — D62 Acute posthemorrhagic anemia: Secondary | ICD-10-CM | POA: Diagnosis not present

## 2020-01-14 DIAGNOSIS — S20419A Abrasion of unspecified back wall of thorax, initial encounter: Secondary | ICD-10-CM | POA: Diagnosis not present

## 2020-01-14 DIAGNOSIS — D649 Anemia, unspecified: Secondary | ICD-10-CM | POA: Diagnosis not present

## 2020-01-17 DIAGNOSIS — M810 Age-related osteoporosis without current pathological fracture: Secondary | ICD-10-CM | POA: Diagnosis not present

## 2020-01-17 DIAGNOSIS — F0281 Dementia in other diseases classified elsewhere with behavioral disturbance: Secondary | ICD-10-CM | POA: Diagnosis not present

## 2020-01-17 DIAGNOSIS — R269 Unspecified abnormalities of gait and mobility: Secondary | ICD-10-CM | POA: Diagnosis not present

## 2020-01-17 DIAGNOSIS — G309 Alzheimer's disease, unspecified: Secondary | ICD-10-CM | POA: Diagnosis not present

## 2020-01-17 DIAGNOSIS — D62 Acute posthemorrhagic anemia: Secondary | ICD-10-CM | POA: Diagnosis not present

## 2020-01-17 DIAGNOSIS — I69398 Other sequelae of cerebral infarction: Secondary | ICD-10-CM | POA: Diagnosis not present

## 2020-01-22 DIAGNOSIS — D62 Acute posthemorrhagic anemia: Secondary | ICD-10-CM | POA: Diagnosis not present

## 2020-01-22 DIAGNOSIS — M810 Age-related osteoporosis without current pathological fracture: Secondary | ICD-10-CM | POA: Diagnosis not present

## 2020-01-22 DIAGNOSIS — F0281 Dementia in other diseases classified elsewhere with behavioral disturbance: Secondary | ICD-10-CM | POA: Diagnosis not present

## 2020-01-22 DIAGNOSIS — G309 Alzheimer's disease, unspecified: Secondary | ICD-10-CM | POA: Diagnosis not present

## 2020-01-22 DIAGNOSIS — I69398 Other sequelae of cerebral infarction: Secondary | ICD-10-CM | POA: Diagnosis not present

## 2020-01-22 DIAGNOSIS — R269 Unspecified abnormalities of gait and mobility: Secondary | ICD-10-CM | POA: Diagnosis not present

## 2020-01-24 DIAGNOSIS — R269 Unspecified abnormalities of gait and mobility: Secondary | ICD-10-CM | POA: Diagnosis not present

## 2020-01-24 DIAGNOSIS — D62 Acute posthemorrhagic anemia: Secondary | ICD-10-CM | POA: Diagnosis not present

## 2020-01-24 DIAGNOSIS — F0281 Dementia in other diseases classified elsewhere with behavioral disturbance: Secondary | ICD-10-CM | POA: Diagnosis not present

## 2020-01-24 DIAGNOSIS — G309 Alzheimer's disease, unspecified: Secondary | ICD-10-CM | POA: Diagnosis not present

## 2020-01-24 DIAGNOSIS — I69398 Other sequelae of cerebral infarction: Secondary | ICD-10-CM | POA: Diagnosis not present

## 2020-01-24 DIAGNOSIS — M810 Age-related osteoporosis without current pathological fracture: Secondary | ICD-10-CM | POA: Diagnosis not present

## 2020-01-28 DIAGNOSIS — R269 Unspecified abnormalities of gait and mobility: Secondary | ICD-10-CM | POA: Diagnosis not present

## 2020-01-28 DIAGNOSIS — M810 Age-related osteoporosis without current pathological fracture: Secondary | ICD-10-CM | POA: Diagnosis not present

## 2020-01-28 DIAGNOSIS — F0281 Dementia in other diseases classified elsewhere with behavioral disturbance: Secondary | ICD-10-CM | POA: Diagnosis not present

## 2020-01-28 DIAGNOSIS — D62 Acute posthemorrhagic anemia: Secondary | ICD-10-CM | POA: Diagnosis not present

## 2020-01-28 DIAGNOSIS — I69398 Other sequelae of cerebral infarction: Secondary | ICD-10-CM | POA: Diagnosis not present

## 2020-01-28 DIAGNOSIS — G309 Alzheimer's disease, unspecified: Secondary | ICD-10-CM | POA: Diagnosis not present

## 2020-01-30 DIAGNOSIS — G309 Alzheimer's disease, unspecified: Secondary | ICD-10-CM | POA: Diagnosis not present

## 2020-01-30 DIAGNOSIS — R269 Unspecified abnormalities of gait and mobility: Secondary | ICD-10-CM | POA: Diagnosis not present

## 2020-01-30 DIAGNOSIS — I69398 Other sequelae of cerebral infarction: Secondary | ICD-10-CM | POA: Diagnosis not present

## 2020-01-30 DIAGNOSIS — D62 Acute posthemorrhagic anemia: Secondary | ICD-10-CM | POA: Diagnosis not present

## 2020-01-30 DIAGNOSIS — M810 Age-related osteoporosis without current pathological fracture: Secondary | ICD-10-CM | POA: Diagnosis not present

## 2020-01-30 DIAGNOSIS — F0281 Dementia in other diseases classified elsewhere with behavioral disturbance: Secondary | ICD-10-CM | POA: Diagnosis not present

## 2020-02-04 DIAGNOSIS — D649 Anemia, unspecified: Secondary | ICD-10-CM | POA: Diagnosis not present

## 2020-02-04 DIAGNOSIS — G309 Alzheimer's disease, unspecified: Secondary | ICD-10-CM | POA: Diagnosis not present

## 2020-02-04 DIAGNOSIS — M810 Age-related osteoporosis without current pathological fracture: Secondary | ICD-10-CM | POA: Diagnosis not present

## 2020-02-04 DIAGNOSIS — D62 Acute posthemorrhagic anemia: Secondary | ICD-10-CM | POA: Diagnosis not present

## 2020-02-04 DIAGNOSIS — R269 Unspecified abnormalities of gait and mobility: Secondary | ICD-10-CM | POA: Diagnosis not present

## 2020-02-04 DIAGNOSIS — F0391 Unspecified dementia with behavioral disturbance: Secondary | ICD-10-CM | POA: Diagnosis not present

## 2020-02-04 DIAGNOSIS — I10 Essential (primary) hypertension: Secondary | ICD-10-CM | POA: Diagnosis not present

## 2020-02-04 DIAGNOSIS — E039 Hypothyroidism, unspecified: Secondary | ICD-10-CM | POA: Diagnosis not present

## 2020-02-04 DIAGNOSIS — F0281 Dementia in other diseases classified elsewhere with behavioral disturbance: Secondary | ICD-10-CM | POA: Diagnosis not present

## 2020-02-04 DIAGNOSIS — I69398 Other sequelae of cerebral infarction: Secondary | ICD-10-CM | POA: Diagnosis not present

## 2020-02-06 DIAGNOSIS — I10 Essential (primary) hypertension: Secondary | ICD-10-CM | POA: Diagnosis not present

## 2020-02-06 DIAGNOSIS — Z79899 Other long term (current) drug therapy: Secondary | ICD-10-CM | POA: Diagnosis not present

## 2020-02-06 DIAGNOSIS — E782 Mixed hyperlipidemia: Secondary | ICD-10-CM | POA: Diagnosis not present

## 2020-02-06 DIAGNOSIS — E039 Hypothyroidism, unspecified: Secondary | ICD-10-CM | POA: Diagnosis not present

## 2020-02-07 ENCOUNTER — Other Ambulatory Visit: Payer: Self-pay

## 2020-02-07 ENCOUNTER — Emergency Department (HOSPITAL_COMMUNITY): Payer: Medicare Other

## 2020-02-07 ENCOUNTER — Encounter (HOSPITAL_COMMUNITY): Payer: Self-pay

## 2020-02-07 ENCOUNTER — Inpatient Hospital Stay (HOSPITAL_COMMUNITY)
Admission: EM | Admit: 2020-02-07 | Discharge: 2020-02-10 | DRG: 092 | Disposition: A | Discharge status: Skilled Nursing Facility | Payer: Medicare Other | Source: Skilled Nursing Facility | Attending: Internal Medicine | Admitting: Internal Medicine

## 2020-02-07 DIAGNOSIS — T68XXXA Hypothermia, initial encounter: Secondary | ICD-10-CM | POA: Diagnosis not present

## 2020-02-07 DIAGNOSIS — N183 Chronic kidney disease, stage 3 unspecified: Secondary | ICD-10-CM | POA: Diagnosis present

## 2020-02-07 DIAGNOSIS — E039 Hypothyroidism, unspecified: Secondary | ICD-10-CM | POA: Diagnosis present

## 2020-02-07 DIAGNOSIS — F0150 Vascular dementia without behavioral disturbance: Secondary | ICD-10-CM

## 2020-02-07 DIAGNOSIS — R6510 Systemic inflammatory response syndrome (SIRS) of non-infectious origin without acute organ dysfunction: Secondary | ICD-10-CM | POA: Diagnosis not present

## 2020-02-07 DIAGNOSIS — G9340 Encephalopathy, unspecified: Secondary | ICD-10-CM

## 2020-02-07 DIAGNOSIS — G92 Toxic encephalopathy: Principal | ICD-10-CM | POA: Diagnosis present

## 2020-02-07 DIAGNOSIS — Z87891 Personal history of nicotine dependence: Secondary | ICD-10-CM

## 2020-02-07 DIAGNOSIS — Z9889 Other specified postprocedural states: Secondary | ICD-10-CM

## 2020-02-07 DIAGNOSIS — E785 Hyperlipidemia, unspecified: Secondary | ICD-10-CM | POA: Diagnosis present

## 2020-02-07 DIAGNOSIS — A419 Sepsis, unspecified organism: Secondary | ICD-10-CM | POA: Diagnosis present

## 2020-02-07 DIAGNOSIS — Z91048 Other nonmedicinal substance allergy status: Secondary | ICD-10-CM

## 2020-02-07 DIAGNOSIS — Z823 Family history of stroke: Secondary | ICD-10-CM

## 2020-02-07 DIAGNOSIS — W050XXA Fall from non-moving wheelchair, initial encounter: Secondary | ICD-10-CM | POA: Diagnosis present

## 2020-02-07 DIAGNOSIS — Z20822 Contact with and (suspected) exposure to covid-19: Secondary | ICD-10-CM | POA: Diagnosis not present

## 2020-02-07 DIAGNOSIS — I959 Hypotension, unspecified: Secondary | ICD-10-CM | POA: Diagnosis not present

## 2020-02-07 DIAGNOSIS — E162 Hypoglycemia, unspecified: Secondary | ICD-10-CM | POA: Diagnosis present

## 2020-02-07 DIAGNOSIS — G9389 Other specified disorders of brain: Secondary | ICD-10-CM | POA: Diagnosis present

## 2020-02-07 DIAGNOSIS — D61818 Other pancytopenia: Secondary | ICD-10-CM | POA: Diagnosis not present

## 2020-02-07 DIAGNOSIS — F0151 Vascular dementia with behavioral disturbance: Secondary | ICD-10-CM | POA: Diagnosis present

## 2020-02-07 DIAGNOSIS — S199XXA Unspecified injury of neck, initial encounter: Secondary | ICD-10-CM | POA: Diagnosis not present

## 2020-02-07 DIAGNOSIS — R680 Hypothermia, not associated with low environmental temperature: Secondary | ICD-10-CM | POA: Diagnosis not present

## 2020-02-07 DIAGNOSIS — S0990XA Unspecified injury of head, initial encounter: Secondary | ICD-10-CM | POA: Diagnosis present

## 2020-02-07 DIAGNOSIS — R32 Unspecified urinary incontinence: Secondary | ICD-10-CM | POA: Diagnosis present

## 2020-02-07 DIAGNOSIS — I10 Essential (primary) hypertension: Secondary | ICD-10-CM | POA: Diagnosis present

## 2020-02-07 DIAGNOSIS — Z515 Encounter for palliative care: Secondary | ICD-10-CM | POA: Diagnosis not present

## 2020-02-07 DIAGNOSIS — W19XXXA Unspecified fall, initial encounter: Secondary | ICD-10-CM | POA: Diagnosis not present

## 2020-02-07 DIAGNOSIS — Z96653 Presence of artificial knee joint, bilateral: Secondary | ICD-10-CM | POA: Diagnosis present

## 2020-02-07 DIAGNOSIS — Z79899 Other long term (current) drug therapy: Secondary | ICD-10-CM

## 2020-02-07 DIAGNOSIS — Z66 Do not resuscitate: Secondary | ICD-10-CM | POA: Diagnosis present

## 2020-02-07 DIAGNOSIS — R001 Bradycardia, unspecified: Secondary | ICD-10-CM | POA: Diagnosis not present

## 2020-02-07 DIAGNOSIS — R4182 Altered mental status, unspecified: Secondary | ICD-10-CM

## 2020-02-07 DIAGNOSIS — F09 Unspecified mental disorder due to known physiological condition: Secondary | ICD-10-CM

## 2020-02-07 DIAGNOSIS — R531 Weakness: Secondary | ICD-10-CM | POA: Diagnosis not present

## 2020-02-07 DIAGNOSIS — Z91012 Allergy to eggs: Secondary | ICD-10-CM

## 2020-02-07 DIAGNOSIS — K219 Gastro-esophageal reflux disease without esophagitis: Secondary | ICD-10-CM | POA: Diagnosis present

## 2020-02-07 DIAGNOSIS — R404 Transient alteration of awareness: Secondary | ICD-10-CM | POA: Diagnosis not present

## 2020-02-07 DIAGNOSIS — R296 Repeated falls: Secondary | ICD-10-CM | POA: Diagnosis present

## 2020-02-07 DIAGNOSIS — I2510 Atherosclerotic heart disease of native coronary artery without angina pectoris: Secondary | ICD-10-CM | POA: Diagnosis present

## 2020-02-07 DIAGNOSIS — Z888 Allergy status to other drugs, medicaments and biological substances status: Secondary | ICD-10-CM

## 2020-02-07 DIAGNOSIS — Z8673 Personal history of transient ischemic attack (TIA), and cerebral infarction without residual deficits: Secondary | ICD-10-CM

## 2020-02-07 DIAGNOSIS — I639 Cerebral infarction, unspecified: Secondary | ICD-10-CM | POA: Diagnosis present

## 2020-02-07 DIAGNOSIS — S299XXA Unspecified injury of thorax, initial encounter: Secondary | ICD-10-CM | POA: Diagnosis not present

## 2020-02-07 DIAGNOSIS — I69398 Other sequelae of cerebral infarction: Secondary | ICD-10-CM | POA: Diagnosis not present

## 2020-02-07 DIAGNOSIS — I129 Hypertensive chronic kidney disease with stage 1 through stage 4 chronic kidney disease, or unspecified chronic kidney disease: Secondary | ICD-10-CM | POA: Diagnosis present

## 2020-02-07 HISTORY — DX: Unspecified dementia, unspecified severity, without behavioral disturbance, psychotic disturbance, mood disturbance, and anxiety: F03.90

## 2020-02-07 LAB — CBC WITH DIFFERENTIAL/PLATELET
Abs Immature Granulocytes: 0 10*3/uL (ref 0.00–0.07)
Basophils Absolute: 0 10*3/uL (ref 0.0–0.1)
Basophils Relative: 1 %
Eosinophils Absolute: 0.2 10*3/uL (ref 0.0–0.5)
Eosinophils Relative: 5 %
HCT: 28 % — ABNORMAL LOW (ref 39.0–52.0)
Hemoglobin: 8.2 g/dL — ABNORMAL LOW (ref 13.0–17.0)
Immature Granulocytes: 0 %
Lymphocytes Relative: 22 %
Lymphs Abs: 0.7 10*3/uL (ref 0.7–4.0)
MCH: 25.5 pg — ABNORMAL LOW (ref 26.0–34.0)
MCHC: 29.3 g/dL — ABNORMAL LOW (ref 30.0–36.0)
MCV: 87.2 fL (ref 80.0–100.0)
Monocytes Absolute: 0.4 10*3/uL (ref 0.1–1.0)
Monocytes Relative: 12 %
Neutro Abs: 1.9 10*3/uL (ref 1.7–7.7)
Neutrophils Relative %: 60 %
Platelets: 139 10*3/uL — ABNORMAL LOW (ref 150–400)
RBC: 3.21 MIL/uL — ABNORMAL LOW (ref 4.22–5.81)
RDW: 18.8 % — ABNORMAL HIGH (ref 11.5–15.5)
WBC: 3.1 10*3/uL — ABNORMAL LOW (ref 4.0–10.5)
nRBC: 0 % (ref 0.0–0.2)

## 2020-02-07 LAB — BLOOD GAS, VENOUS
Acid-Base Excess: 3.8 mmol/L — ABNORMAL HIGH (ref 0.0–2.0)
Bicarbonate: 29.8 mmol/L — ABNORMAL HIGH (ref 20.0–28.0)
O2 Saturation: 78.2 %
Patient temperature: 98.6
pCO2, Ven: 57.9 mmHg (ref 44.0–60.0)
pH, Ven: 7.332 (ref 7.250–7.430)
pO2, Ven: 49.8 mmHg — ABNORMAL HIGH (ref 32.0–45.0)

## 2020-02-07 LAB — COMPREHENSIVE METABOLIC PANEL
ALT: 23 U/L (ref 0–44)
AST: 27 U/L (ref 15–41)
Albumin: 3.7 g/dL (ref 3.5–5.0)
Alkaline Phosphatase: 88 U/L (ref 38–126)
Anion gap: 9 (ref 5–15)
BUN: 24 mg/dL — ABNORMAL HIGH (ref 8–23)
CO2: 30 mmol/L (ref 22–32)
Calcium: 9.9 mg/dL (ref 8.9–10.3)
Chloride: 103 mmol/L (ref 98–111)
Creatinine, Ser: 1.07 mg/dL (ref 0.61–1.24)
GFR calc Af Amer: >60 mL/min (ref >60)
GFR calc non Af Amer: >60 mL/min (ref >60)
Glucose, Bld: 98 mg/dL (ref 70–99)
Potassium: 4.4 mmol/L (ref 3.5–5.1)
Sodium: 142 mmol/L (ref 135–145)
Total Bilirubin: 0.3 mg/dL (ref 0.3–1.2)
Total Protein: 7.3 g/dL (ref 6.5–8.1)

## 2020-02-07 LAB — LACTIC ACID, PLASMA: Lactic Acid, Venous: 1.4 mmol/L (ref 0.5–1.9)

## 2020-02-07 LAB — RESPIRATORY PANEL BY RT PCR (FLU A&B, COVID)
Influenza A by PCR: NEGATIVE
Influenza B by PCR: NEGATIVE
SARS Coronavirus 2 by RT PCR: NEGATIVE

## 2020-02-07 LAB — PROTIME-INR
INR: 1.2 (ref 0.8–1.2)
Prothrombin Time: 14.6 seconds (ref 11.4–15.2)

## 2020-02-07 LAB — TROPONIN I (HIGH SENSITIVITY): Troponin I (High Sensitivity): 11 ng/L (ref <18)

## 2020-02-07 NOTE — ED Notes (Signed)
[facility] called: patient has been off all day and think he has a UTI. Has been incontinent and unable to give a sample. Ativan increase was AFTER the patient started having lethargy

## 2020-02-07 NOTE — ED Provider Notes (Signed)
[facility] DEPT Provider Note   CSN: [id number] Arrival date & time: 02/07/20  2007     History Chief Complaint  Patient presents with  . Fall    Napoleon Yerena is a 86 y.o. male.  HPI Patient seen from [facility].  Patient had a witnessed fall out of his wheelchair.  He struck his head without loss of consciousness.  Reportedly there was no report of pain.  Report is that at baseline patient is alert and ambulatory about the facility as well as talking frequently with staff.  Reportedly patient recently had Ativan dosing for increased agitation.  Reported that patient has been more lethargic since that time.  No additional history provided.  Patient is not a good  historian.    Past Medical History:  Diagnosis Date  . Acid reflux   . Carotid artery occlusion   . Hypertension   . Hypothyroidism   . Thyroid disease     Patient Active Problem List   Diagnosis Date Noted  . Esophagitis 11/16/2019  . AKI (acute kidney injury) ([place]) 11/16/2019  . CKD (chronic kidney disease), stage III 11/13/2019  . Upper GI bleed 11/12/2019  . GI bleeding 11/11/2019  . Cognitive dysfunction 10/25/2019  . History of CVA (cerebrovascular accident) 10/25/2019  . Anemia 10/25/2019  . Hyperlipidemia 09/26/2019  . Essential hypertension 09/26/2019  . TIA due to embolism ([place]) 09/26/2019  . H/O carotid endarterectomy 09/26/2019  . Carotid artery stenosis, symptomatic, right 07/25/2019  . CVA (cerebral vascular accident) ([place]) 07/11/2019  . TIA (transient ischemic attack) 07/10/2019  . Adjustment disorder with mixed anxiety and depressed mood 06/20/2018  . Vitamin D insufficiency 06/20/2018  . History of anemia 06/20/2018  . Medically noncompliant-   does not want to go for hearing eval 06/20/2018  . Bilateral impacted cerumen 02/07/2018  . Hearing difficulty of both ears 02/03/2018  . Environmental and seasonal allergies 02/03/2018  .  Eustachian tube dysfunction, bilateral 02/03/2018  . History of vitamin D deficiency 01/30/2018  . Contact dermatitis and eczema 10/19/2017  . HTN (hypertension) 06/22/2017  . GERD (gastroesophageal reflux disease) 06/22/2017  . Hx of colonoscopy 06/22/2017  . Mood disorder ([place]) 06/22/2017  . Hypothyroidism 06/22/2017  . Chronic pancreatitis ([place]) 06/22/2017    Past Surgical History:  Procedure Laterality Date  . CAROTID ENDARTERECTOMY Right 07/25/2019  . COLONOSCOPY    . ENDARTERECTOMY Right 07/25/2019   Procedure: RIGHT ENDARTERECTOMY CAROTID with patch angioplasty;  Surgeon: Rosetta Posner, MD;  Location: [facility];  Service: Vascular;  Laterality: Right;  . ESOPHAGOGASTRODUODENOSCOPY    . ESOPHAGOGASTRODUODENOSCOPY Left 11/13/2019   Procedure: ESOPHAGOGASTRODUODENOSCOPY (EGD);  Surgeon: Carol Ada, MD;  Location: [facility];  Service: Endoscopy;  Laterality: Left;  . EYE SURGERY Bilateral    cataracts  . INGUINAL HERNIA REPAIR    . LIPOMA EXCISION    . MELANOMA EXCISION    . REPLACEMENT TOTAL KNEE BILATERAL         Family History  Problem Relation Age of Onset  . Stroke Mother   . Stroke Father     Social History   Tobacco Use  . Smoking status: Former Smoker    Quit date: 1953    Years since quitting: 68.3  . Smokeless tobacco: Former Systems developer    Types: Chew  Substance Use Topics  . Alcohol use: Yes    Comment: 2oz Vodka a day - family regulates daily  . Drug use: No    Home  Medications Prior to Admission medications   Medication Sig Start Date End Date Taking? Authorizing Provider  amLODipine (NORVASC) 5 MG tablet Take 1 tablet (5 mg total) by mouth daily. 11/14/19   Rai, Vernelle Emerald, MD  atorvastatin (LIPITOR) 40 MG tablet Take 1 tablet (40 mg total) by mouth daily at 6 PM. No more rfs-->  Future Refills per long term care facility pt is entering jan 25-27, 2021 10/25/19   Opalski, Deborah, DO  divalproex (DEPAKOTE) 125 MG DR tablet Take 1 tablet (125 mg total)  by mouth daily. Patient taking differently: Take 125 mg by mouth 3 (three) times daily.  10/01/19   Frann Rider, NP  fluticasone (FLONASE) 50 MCG/ACT nasal spray Place 2 sprays into both nostrils daily as needed for allergies or rhinitis.    [provider]  iron polysaccharides (NIFEREX) 150 MG capsule Take 1 capsule (150 mg total) by mouth daily. 11/14/19   Rai, Vernelle Emerald, MD  lipase/protease/amylase (CREON) 36000 UNITS CPEP capsule Take 36,000 Units by mouth 3 (three) times daily with meals.    [provider]  LORazepam (ATIVAN) 0.5 MG tablet Take 0.25 mg by mouth 2 (two) times daily as needed for anxiety.    [provider]  magnesium oxide (MAG-OX) 400 (241.3 Mg) MG tablet Take 1 tablet (400 mg total) by mouth daily. 07/12/19   Eulogio Bear U, DO  melatonin 5 MG TABS Take 10 mg by mouth at bedtime.     [provider]  omeprazole-sodium bicarbonate (ZEGERID) 40-1100 MG capsule Take 1 capsule by mouth 2 (two) times daily before a meal. Transition to once daily after 1 month 11/19/19 01/18/20  Elodia Florence., MD  ondansetron (ZOFRAN) 4 MG tablet Take 4 mg by mouth daily as needed for nausea or vomiting.    [provider]  QUEtiapine (SEROQUEL) 25 MG tablet Take 0.5 tablets (12.5 mg total) by mouth at bedtime. Patient taking differently: Take 25 mg by mouth at bedtime.  11/14/19   Rai, Ripudeep K, MD  risperiDONE (RISPERDAL) 0.25 MG tablet Take 0.25 mg by mouth in the morning, at noon, and at bedtime.    [provider]  senna (SENOKOT) 8.6 MG TABS tablet Take 1 tablet (8.6 mg total) by mouth 2 (two) times daily. 11/14/19   Rai, Vernelle Emerald, MD  sertraline (ZOLOFT) 25 MG tablet Take 1 tablet (25 mg total) by mouth daily. No more rfs-->  Future Refills per long term care facility pt is entering jan 25-27, 2021 Patient taking differently: Take 25 mg by mouth daily.  10/25/19   Mellody Dance, DO  Skin Protectants, Misc. (EUCERIN) cream  APPLY TWICE DAILY AS NEEDED FOR RASH OR ITCHING Patient taking differently: Apply 1 application topically every 12 (twelve) hours as needed (rash or itching).  12/04/18   Mellody Dance, DO  thyroid (NP THYROID) 60 MG tablet TAKE 1 TABLET BY MOUTH ONCE DAILY BEFORE BREAKFAST  No more rfs-->  Future Refills per long term care facility pt is entering jan 25-27, 2021 Patient taking differently: Take 60 mg by mouth daily before breakfast.  10/25/19   Opalski, Neoma Laming, DO  triamcinolone cream (KENALOG) 0.1 % Apply 1 application topically as needed (rash).    [provider]  Vitamin D, Ergocalciferol, (DRISDOL) 1.25 MG (50000 UNIT) CAPS capsule Take 1 capsule (50,000 Units total) by mouth every 7 (seven) days. No more rfs-->  Future Refills per long term care facility pt is entering jan 25-27, 2021 Patient taking  differently: Take 50,000 Units by mouth every 7 (seven) days.  10/25/19   Mellody Dance, DO    Allergies    Doxycycline and Eggs or egg-derived products  Review of Systems   Review of Systems Level 5 caveat cannot obtain review of systems due to confusion Physical Exam Updated Vital Signs BP 105/63   Pulse (!) 49   Temp (!) 90.5 F (32.5 C) (Rectal)   Resp 13   Ht 5\' 8"  (1.727 m)   Wt 77.1 kg   SpO2 96%   BMI 25.85 kg/m   Physical Exam Constitutional:      Comments: Patient has cervical collar in place.  He seems very confused and somewhat lethargic.  He is not answering any questions.  HENT:     Head: Normocephalic and atraumatic.     Mouth/Throat:     Mouth: Mucous membranes are moist.     Pharynx: Oropharynx is clear.  Eyes:     Comments: Pupils are 2 mm and symmetric.  He is gazing about somewhat.  Eye motions are conjugate.  He is not following commands for tracking.  Neck:     Comments: Cervical collar maintained until C-spine films returned. Cardiovascular:     Comments: Bradycardia regular.  No gross rub murmur gallop. Pulmonary:     Comments: No  respiratory distress at rest.  Breath sounds regular without gross wheeze rhonchi or rale. Abdominal:     Comments: Abdomen soft nondistended patient does not express pain with palpation.  Genitourinary:    Penis: Normal.   Musculoskeletal:     Comments: 1+ edema bilateral lower extremities.  No extremity deformities or significant abrasions.  Skin:    General: Skin is warm and dry.  Neurological:     Comments: Patient is very somnolent and not answering questions.  He is gazing about somewhat but not tracking.  Does not follow commands for grip strength.     ED Results / Procedures / Treatments   Labs (all labs ordered are listed, but only abnormal results are displayed) Labs Reviewed  COMPREHENSIVE METABOLIC PANEL - Abnormal; Notable for the following components:      Result Value   BUN 24 (*)    All other components within normal limits  CBC WITH DIFFERENTIAL/PLATELET - Abnormal; Notable for the following components:   WBC 3.1 (*)    RBC 3.21 (*)    Hemoglobin 8.2 (*)    HCT 28.0 (*)    MCH 25.5 (*)    MCHC 29.3 (*)    RDW 18.8 (*)    Platelets 139 (*)    All other components within normal limits  BLOOD GAS, VENOUS - Abnormal; Notable for the following components:   pO2, Ven 49.8 (*)    Bicarbonate 29.8 (*)    Acid-Base Excess 3.8 (*)    All other components within normal limits  RESPIRATORY PANEL BY RT PCR (FLU A&B, COVID)  CULTURE, BLOOD (ROUTINE X 2)  CULTURE, BLOOD (ROUTINE X 2)  URINE CULTURE  LACTIC ACID, PLASMA  PROTIME-INR  URINALYSIS, ROUTINE W REFLEX MICROSCOPIC  APTT  TROPONIN I (HIGH SENSITIVITY)  TROPONIN I (HIGH SENSITIVITY)    EKG EKG Interpretation  Date/Time:  Thursday Feb 07 2020 21:56:06 EDT Ventricular Rate:  49 PR Interval:    QRS Duration: 120 QT Interval:  499 QTC Calculation: 451 R Axis:   5 Text Interpretation: Sinus or ectopic atrial bradycardia Prolonged PR interval Nonspecific intraventricular conduction delay Anterior infarct,  old Minimal ST elevation,  lateral leads no sig change from previous Confirmed by Charlesetta Shanks [phone]) on 02/08/2020 12:28:14 AM   Radiology DG Chest 1 View  Result Date: 02/07/2020 CLINICAL DATA:  Witnessed fall from wheelchair EXAM: CHEST  1 VIEW COMPARISON:  CT 11/16/2019 FINDINGS: Chronically coarsened interstitial changes, similar to prior. No consolidation, features of edema, pneumothorax, or effusion. Stable cardiomediastinal contours. The osseous structures appear diffusely demineralized which may limit detection of small or nondisplaced fractures. No acute osseous abnormality or suspicious osseous lesion. Degenerative changes are present in the imaged spine and shoulders. Telemetry leads overlie the chest. IMPRESSION: No acute cardiopulmonary or traumatic findings in the chest. Chronically coarsened interstitium. Electronically Signed   By: Lovena Le M.D.   On: 02/07/2020 22:54   CT Head Wo Contrast  Result Date: 02/07/2020 CLINICAL DATA:  Altered mental status, fall from wheelchair EXAM: CT HEAD WITHOUT CONTRAST CT CERVICAL SPINE WITHOUT CONTRAST TECHNIQUE: Multidetector CT imaging of the head and cervical spine was performed following the standard protocol without intravenous contrast. Multiplanar CT image reconstructions of the cervical spine were also generated. COMPARISON:  CT 01/13/2020 FINDINGS: CT HEAD FINDINGS Motion degraded imaging despite several attempts at acquisition Brain: Stable sequela of prior infarcts of the right frontal parietal vertex and occipital lobe as well as scattered remote bilateral lacunar infarcts in the basal ganglia. No evidence of acute infarction, hemorrhage, hydrocephalus, extra-axial collection or mass lesion/mass effect. Symmetric prominence of the ventricles, cisterns and sulci compatible with moderate parenchymal volume loss. Patchy and confluent areas of white matter hypoattenuation are most compatible with chronic microvascular angiopathy. Vascular:  Atherosclerotic calcification of the carotid siphons and intradural vertebral arteries. No hyperdense vessel. Skull: Motion degraded imaging. No large hematoma or focal swelling. No calvarial fracture or suspicious osseous lesions. Sinuses/Orbits: Paranasal sinuses and mastoid air cells are predominantly clear. Orbital structures are unremarkable aside from prior lens extractions. Other: None CT CERVICAL SPINE FINDINGS Alignment: Preservation of the normal cervical lordosis without traumatic listhesis. No abnormally widened, perched or jumped facets. Normal alignment of the craniocervical and atlantoaxial articulations accounting for mild leftward cranial rotation of. Skull base and vertebrae: Within the limitations of motion artifact, no visible skull base or vertebral fracture is seen. The dens is intact. Multilevel flowing anterior osteophytosis, compatible with features of diffuse idiopathic skeletal hyperostosis (DISH). Bony fusion across the C4-5 articular facets is noted as well. No worrisome osseous lesions. Soft tissues and spinal canal: No pre or paravertebral fluid or swelling. No visible canal hematoma. Pannus formation about the dens is stable from priors. Disc levels: Disc osteophyte complex at C2-3 with posterior ligamentum flavum hypertrophy resulting in at most mild stenosis at this level. No other significant canal stenosis. Some uncinate spurring and more pronounced facet hypertrophic changes result in mild right foraminal narrowing C2-3, bilateral foraminal narrowing at C5-6. Upper chest: No acute abnormality in the upper chest or imaged lung apices. Biapical pleuroparenchymal scarring is similar to priors. Postsurgical changes of the right neck likely from prior endarterectomy. Extensive calcification at the left carotid bifurcation. Other: Extensive calcifications about the tonsils may reflect calcified tonsilloliths. Diminutive but otherwise normal thyroid. IMPRESSION: CT head: 1. Motion  degraded imaging despite several attempts at acquisition. 2. No evidence of acute intracranial abnormality. 3. Stable sequela of prior infarcts of the right frontal parietal vertex and occipital lobe as well as scattered remote bilateral basal ganglia lacunar infarcts. 4. Moderate parenchymal volume loss and chronic microvascular angiopathy. CT cervical spine: 1. No evidence of acute fracture or traumatic  listhesis of the cervical spine. 2. Multilevel flowing anterior osteophytosis, compatible with features of diffuse idiopathic skeletal hyperostosis (DISH). 3. Cervical spondylitic changes, detailed above. 4. Cervical and intracranial atherosclerosis. 5. Postsurgical changes of the right neck likely from prior endarterectomy. Electronically Signed   By: Lovena Le M.D.   On: 02/07/2020 23:05   CT Cervical Spine Wo Contrast  Result Date: 02/07/2020 CLINICAL DATA:  Altered mental status, fall from wheelchair EXAM: CT HEAD WITHOUT CONTRAST CT CERVICAL SPINE WITHOUT CONTRAST TECHNIQUE: Multidetector CT imaging of the head and cervical spine was performed following the standard protocol without intravenous contrast. Multiplanar CT image reconstructions of the cervical spine were also generated. COMPARISON:  CT 01/13/2020 FINDINGS: CT HEAD FINDINGS Motion degraded imaging despite several attempts at acquisition Brain: Stable sequela of prior infarcts of the right frontal parietal vertex and occipital lobe as well as scattered remote bilateral lacunar infarcts in the basal ganglia. No evidence of acute infarction, hemorrhage, hydrocephalus, extra-axial collection or mass lesion/mass effect. Symmetric prominence of the ventricles, cisterns and sulci compatible with moderate parenchymal volume loss. Patchy and confluent areas of white matter hypoattenuation are most compatible with chronic microvascular angiopathy. Vascular: Atherosclerotic calcification of the carotid siphons and intradural vertebral arteries. No  hyperdense vessel. Skull: Motion degraded imaging. No large hematoma or focal swelling. No calvarial fracture or suspicious osseous lesions. Sinuses/Orbits: Paranasal sinuses and mastoid air cells are predominantly clear. Orbital structures are unremarkable aside from prior lens extractions. Other: None CT CERVICAL SPINE FINDINGS Alignment: Preservation of the normal cervical lordosis without traumatic listhesis. No abnormally widened, perched or jumped facets. Normal alignment of the craniocervical and atlantoaxial articulations accounting for mild leftward cranial rotation of. Skull base and vertebrae: Within the limitations of motion artifact, no visible skull base or vertebral fracture is seen. The dens is intact. Multilevel flowing anterior osteophytosis, compatible with features of diffuse idiopathic skeletal hyperostosis (DISH). Bony fusion across the C4-5 articular facets is noted as well. No worrisome osseous lesions. Soft tissues and spinal canal: No pre or paravertebral fluid or swelling. No visible canal hematoma. Pannus formation about the dens is stable from priors. Disc levels: Disc osteophyte complex at C2-3 with posterior ligamentum flavum hypertrophy resulting in at most mild stenosis at this level. No other significant canal stenosis. Some uncinate spurring and more pronounced facet hypertrophic changes result in mild right foraminal narrowing C2-3, bilateral foraminal narrowing at C5-6. Upper chest: No acute abnormality in the upper chest or imaged lung apices. Biapical pleuroparenchymal scarring is similar to priors. Postsurgical changes of the right neck likely from prior endarterectomy. Extensive calcification at the left carotid bifurcation. Other: Extensive calcifications about the tonsils may reflect calcified tonsilloliths. Diminutive but otherwise normal thyroid. IMPRESSION: CT head: 1. Motion degraded imaging despite several attempts at acquisition. 2. No evidence of acute intracranial  abnormality. 3. Stable sequela of prior infarcts of the right frontal parietal vertex and occipital lobe as well as scattered remote bilateral basal ganglia lacunar infarcts. 4. Moderate parenchymal volume loss and chronic microvascular angiopathy. CT cervical spine: 1. No evidence of acute fracture or traumatic listhesis of the cervical spine. 2. Multilevel flowing anterior osteophytosis, compatible with features of diffuse idiopathic skeletal hyperostosis (DISH). 3. Cervical spondylitic changes, detailed above. 4. Cervical and intracranial atherosclerosis. 5. Postsurgical changes of the right neck likely from prior endarterectomy. Electronically Signed   By: Lovena Le M.D.   On: 02/07/2020 23:05    Procedures Procedures (including critical care time) CRITICAL CARE Performed by: Charlesetta Shanks  Total critical care time: 30 minutes  Critical care time was exclusive of separately billable procedures and treating other patients.  Critical care was necessary to treat or prevent imminent or life-threatening deterioration.  Critical care was time spent personally by me on the following activities: development of treatment plan with patient and/or surrogate as well as nursing, discussions with consultants, evaluation of patient's response to treatment, examination of patient, obtaining history from patient or surrogate, ordering and performing treatments and interventions, ordering and review of laboratory studies, ordering and review of radiographic studies, pulse oximetry and re-evaluation of patient's condition. Medications Ordered in ED Medications  lactated ringers bolus 1,000 mL (has no administration in time range)    And  lactated ringers bolus 1,000 mL (has no administration in time range)    And  lactated ringers bolus 500 mL (has no administration in time range)  ceFEPIme (MAXIPIME) 2 g in sodium chloride 0.9 % 100 mL IVPB (has no administration in time range)  metroNIDAZOLE (FLAGYL)  IVPB 500 mg (has no administration in time range)  vancomycin (VANCOREADY) IVPB 1500 mg/300 mL (has no administration in time range)    ED Course  I have reviewed the triage vital signs and the nursing notes.  Pertinent labs & imaging results that were available during my care of the patient were reviewed by me and considered in my medical decision making (see chart for details).    MDM Rules/Calculators/A&P                      Patient presents with report of a witnessed fall.  Clinical evaluation is for patient that is very confused and somnolent.  He is not answering any questions.  Patient's rectal temperature is 90 degrees.  Report from nursing home visit patient was more agitated and treated with Ativan and then subsequently lethargic.  At this time, consideration is for possible sepsis.  With hypothermia and soft blood pressures will initiate sepsis protocol.  The report we received was that at baseline patient is very talkative and ambulatory although has severe dementia.  No respiratory distress. Final Clinical Impression(s) / ED Diagnoses Final diagnoses:  Hypothermia, initial encounter  Altered mental status, unspecified altered mental status type  Minor head injury, initial encounter    Rx / DC Orders ED Discharge Orders    None       Charlesetta Shanks, MD 02/08/20 0030

## 2020-02-07 NOTE — ED Triage Notes (Signed)
From [facility] via [facility] with c-collar in place. Witnessed fall out of wheelchair. Hit head, no LOC. No c/o pain. Ativan was recently increased for agitation and patient has been more lethargic than usual since dose increased.

## 2020-02-08 ENCOUNTER — Encounter (HOSPITAL_COMMUNITY): Payer: Self-pay | Admitting: Internal Medicine

## 2020-02-08 ENCOUNTER — Other Ambulatory Visit: Payer: Self-pay

## 2020-02-08 ENCOUNTER — Emergency Department (HOSPITAL_COMMUNITY): Payer: Medicare Other

## 2020-02-08 DIAGNOSIS — T68XXXA Hypothermia, initial encounter: Secondary | ICD-10-CM | POA: Diagnosis not present

## 2020-02-08 DIAGNOSIS — R680 Hypothermia, not associated with low environmental temperature: Secondary | ICD-10-CM | POA: Diagnosis present

## 2020-02-08 DIAGNOSIS — E162 Hypoglycemia, unspecified: Secondary | ICD-10-CM | POA: Diagnosis present

## 2020-02-08 DIAGNOSIS — Z823 Family history of stroke: Secondary | ICD-10-CM | POA: Diagnosis not present

## 2020-02-08 DIAGNOSIS — Z8719 Personal history of other diseases of the digestive system: Secondary | ICD-10-CM | POA: Diagnosis not present

## 2020-02-08 DIAGNOSIS — E785 Hyperlipidemia, unspecified: Secondary | ICD-10-CM | POA: Diagnosis present

## 2020-02-08 DIAGNOSIS — Z515 Encounter for palliative care: Secondary | ICD-10-CM | POA: Diagnosis not present

## 2020-02-08 DIAGNOSIS — E039 Hypothyroidism, unspecified: Secondary | ICD-10-CM | POA: Diagnosis present

## 2020-02-08 DIAGNOSIS — Z8673 Personal history of transient ischemic attack (TIA), and cerebral infarction without residual deficits: Secondary | ICD-10-CM | POA: Diagnosis not present

## 2020-02-08 DIAGNOSIS — I2510 Atherosclerotic heart disease of native coronary artery without angina pectoris: Secondary | ICD-10-CM | POA: Diagnosis present

## 2020-02-08 DIAGNOSIS — R4182 Altered mental status, unspecified: Secondary | ICD-10-CM | POA: Diagnosis not present

## 2020-02-08 DIAGNOSIS — A419 Sepsis, unspecified organism: Secondary | ICD-10-CM

## 2020-02-08 DIAGNOSIS — F0151 Vascular dementia with behavioral disturbance: Secondary | ICD-10-CM | POA: Diagnosis present

## 2020-02-08 DIAGNOSIS — R296 Repeated falls: Secondary | ICD-10-CM | POA: Diagnosis present

## 2020-02-08 DIAGNOSIS — W050XXA Fall from non-moving wheelchair, initial encounter: Secondary | ICD-10-CM | POA: Diagnosis present

## 2020-02-08 DIAGNOSIS — Z66 Do not resuscitate: Secondary | ICD-10-CM | POA: Diagnosis present

## 2020-02-08 DIAGNOSIS — N183 Chronic kidney disease, stage 3 unspecified: Secondary | ICD-10-CM | POA: Diagnosis present

## 2020-02-08 DIAGNOSIS — R6510 Systemic inflammatory response syndrome (SIRS) of non-infectious origin without acute organ dysfunction: Secondary | ICD-10-CM | POA: Diagnosis not present

## 2020-02-08 DIAGNOSIS — Z87891 Personal history of nicotine dependence: Secondary | ICD-10-CM | POA: Diagnosis not present

## 2020-02-08 DIAGNOSIS — D61818 Other pancytopenia: Secondary | ICD-10-CM | POA: Diagnosis present

## 2020-02-08 DIAGNOSIS — Z9889 Other specified postprocedural states: Secondary | ICD-10-CM | POA: Diagnosis not present

## 2020-02-08 DIAGNOSIS — I129 Hypertensive chronic kidney disease with stage 1 through stage 4 chronic kidney disease, or unspecified chronic kidney disease: Secondary | ICD-10-CM | POA: Diagnosis present

## 2020-02-08 DIAGNOSIS — Z8679 Personal history of other diseases of the circulatory system: Secondary | ICD-10-CM | POA: Diagnosis not present

## 2020-02-08 DIAGNOSIS — F09 Unspecified mental disorder due to known physiological condition: Secondary | ICD-10-CM | POA: Diagnosis not present

## 2020-02-08 DIAGNOSIS — K219 Gastro-esophageal reflux disease without esophagitis: Secondary | ICD-10-CM | POA: Diagnosis present

## 2020-02-08 DIAGNOSIS — Z96653 Presence of artificial knee joint, bilateral: Secondary | ICD-10-CM | POA: Diagnosis present

## 2020-02-08 DIAGNOSIS — I63231 Cerebral infarction due to unspecified occlusion or stenosis of right carotid arteries: Secondary | ICD-10-CM | POA: Diagnosis not present

## 2020-02-08 DIAGNOSIS — S0990XA Unspecified injury of head, initial encounter: Secondary | ICD-10-CM | POA: Diagnosis present

## 2020-02-08 DIAGNOSIS — Z8659 Personal history of other mental and behavioral disorders: Secondary | ICD-10-CM | POA: Diagnosis not present

## 2020-02-08 DIAGNOSIS — Z7189 Other specified counseling: Secondary | ICD-10-CM | POA: Diagnosis not present

## 2020-02-08 DIAGNOSIS — I69318 Other symptoms and signs involving cognitive functions following cerebral infarction: Secondary | ICD-10-CM | POA: Diagnosis not present

## 2020-02-08 DIAGNOSIS — Z20822 Contact with and (suspected) exposure to covid-19: Secondary | ICD-10-CM | POA: Diagnosis present

## 2020-02-08 DIAGNOSIS — G92 Toxic encephalopathy: Secondary | ICD-10-CM | POA: Diagnosis present

## 2020-02-08 DIAGNOSIS — G9389 Other specified disorders of brain: Secondary | ICD-10-CM | POA: Diagnosis present

## 2020-02-08 DIAGNOSIS — G9340 Encephalopathy, unspecified: Secondary | ICD-10-CM | POA: Diagnosis not present

## 2020-02-08 DIAGNOSIS — R32 Unspecified urinary incontinence: Secondary | ICD-10-CM | POA: Diagnosis present

## 2020-02-08 DIAGNOSIS — Z91048 Other nonmedicinal substance allergy status: Secondary | ICD-10-CM | POA: Diagnosis not present

## 2020-02-08 DIAGNOSIS — I10 Essential (primary) hypertension: Secondary | ICD-10-CM | POA: Diagnosis not present

## 2020-02-08 LAB — URINALYSIS, ROUTINE W REFLEX MICROSCOPIC
Bilirubin Urine: NEGATIVE
Glucose, UA: NEGATIVE mg/dL
Hgb urine dipstick: NEGATIVE
Ketones, ur: NEGATIVE mg/dL
Leukocytes,Ua: NEGATIVE
Nitrite: NEGATIVE
Protein, ur: NEGATIVE mg/dL
Specific Gravity, Urine: 1.016 (ref 1.005–1.030)
pH: 7 (ref 5.0–8.0)

## 2020-02-08 LAB — MAGNESIUM: Magnesium: 1.8 mg/dL (ref 1.7–2.4)

## 2020-02-08 LAB — CBC WITH DIFFERENTIAL/PLATELET
Abs Immature Granulocytes: 0.01 10*3/uL (ref 0.00–0.07)
Basophils Absolute: 0 10*3/uL (ref 0.0–0.1)
Basophils Relative: 1 %
Eosinophils Absolute: 0.1 10*3/uL (ref 0.0–0.5)
Eosinophils Relative: 3 %
HCT: 24.5 % — ABNORMAL LOW (ref 39.0–52.0)
Hemoglobin: 7.5 g/dL — ABNORMAL LOW (ref 13.0–17.0)
Immature Granulocytes: 0 %
Lymphocytes Relative: 23 %
Lymphs Abs: 0.7 10*3/uL (ref 0.7–4.0)
MCH: 26.6 pg (ref 26.0–34.0)
MCHC: 30.6 g/dL (ref 30.0–36.0)
MCV: 86.9 fL (ref 80.0–100.0)
Monocytes Absolute: 0.4 10*3/uL (ref 0.1–1.0)
Monocytes Relative: 13 %
Neutro Abs: 1.9 10*3/uL (ref 1.7–7.7)
Neutrophils Relative %: 60 %
Platelets: 121 10*3/uL — ABNORMAL LOW (ref 150–400)
RBC: 2.82 MIL/uL — ABNORMAL LOW (ref 4.22–5.81)
RDW: 18.9 % — ABNORMAL HIGH (ref 11.5–15.5)
WBC: 3.2 10*3/uL — ABNORMAL LOW (ref 4.0–10.5)
nRBC: 0 % (ref 0.0–0.2)

## 2020-02-08 LAB — COMPREHENSIVE METABOLIC PANEL
ALT: 20 U/L (ref 0–44)
AST: 23 U/L (ref 15–41)
Albumin: 2.9 g/dL — ABNORMAL LOW (ref 3.5–5.0)
Alkaline Phosphatase: 71 U/L (ref 38–126)
Anion gap: 8 (ref 5–15)
BUN: 20 mg/dL (ref 8–23)
CO2: 28 mmol/L (ref 22–32)
Calcium: 8.7 mg/dL — ABNORMAL LOW (ref 8.9–10.3)
Chloride: 103 mmol/L (ref 98–111)
Creatinine, Ser: 0.98 mg/dL (ref 0.61–1.24)
GFR calc Af Amer: >60 mL/min (ref >60)
GFR calc non Af Amer: >60 mL/min (ref >60)
Glucose, Bld: 60 mg/dL — ABNORMAL LOW (ref 70–99)
Potassium: 4.2 mmol/L (ref 3.5–5.1)
Sodium: 139 mmol/L (ref 135–145)
Total Bilirubin: 0.6 mg/dL (ref 0.3–1.2)
Total Protein: 5.9 g/dL — ABNORMAL LOW (ref 6.5–8.1)

## 2020-02-08 LAB — CBG MONITORING, ED: Glucose-Capillary: 101 mg/dL — ABNORMAL HIGH (ref 70–99)

## 2020-02-08 LAB — APTT: aPTT: 39 seconds — ABNORMAL HIGH (ref 24–36)

## 2020-02-08 LAB — T4, FREE: Free T4: 0.7 ng/dL (ref 0.61–1.12)

## 2020-02-08 LAB — PROCALCITONIN: Procalcitonin: <0.1 ng/mL

## 2020-02-08 LAB — VALPROIC ACID LEVEL: Valproic Acid Lvl: 31 ug/mL — ABNORMAL LOW (ref 50.0–100.0)

## 2020-02-08 LAB — TSH: TSH: 6.965 u[IU]/mL — ABNORMAL HIGH (ref 0.350–4.500)

## 2020-02-08 MED ORDER — VANCOMYCIN HCL 1500 MG/300ML IV SOLN
1500.0000 mg | Freq: Once | INTRAVENOUS | Status: AC
  Administered 2020-02-08: 02:00:00 1500 mg via INTRAVENOUS
  Filled 2020-02-08: qty 300

## 2020-02-08 MED ORDER — ERYTHROMYCIN 5 MG/GM OP OINT
TOPICAL_OINTMENT | Freq: Four times a day (QID) | OPHTHALMIC | Status: DC
  Filled 2020-02-08: qty 3.5

## 2020-02-08 MED ORDER — LACTATED RINGERS IV BOLUS (SEPSIS)
1000.0000 mL | Freq: Once | INTRAVENOUS | Status: AC
  Administered 2020-02-08: 01:00:00 1000 mL via INTRAVENOUS

## 2020-02-08 MED ORDER — VANCOMYCIN HCL IN DEXTROSE 1-5 GM/200ML-% IV SOLN: 1000.0000 mg | Freq: Once | INTRAVENOUS | Status: DC

## 2020-02-08 MED ORDER — METRONIDAZOLE IN NACL 5-0.79 MG/ML-% IV SOLN
500.0000 mg | Freq: Once | INTRAVENOUS | Status: AC
  Administered 2020-02-08: 01:00:00 500 mg via INTRAVENOUS
  Filled 2020-02-08: qty 100

## 2020-02-08 MED ORDER — SODIUM CHLORIDE 0.9 % IV SOLN
2.0000 g | Freq: Once | INTRAVENOUS | Status: AC
  Administered 2020-02-08: 2 g via INTRAVENOUS
  Filled 2020-02-08: qty 2

## 2020-02-08 MED ORDER — LACTATED RINGERS IV BOLUS (SEPSIS)
500.0000 mL | Freq: Once | INTRAVENOUS | Status: AC
  Administered 2020-02-08: 02:00:00 500 mL via INTRAVENOUS

## 2020-02-08 MED ORDER — KCL IN DEXTROSE-NACL 10-5-0.45 MEQ/L-%-% IV SOLN
INTRAVENOUS | Status: DC
  Filled 2020-02-08 (×5): qty 1000

## 2020-02-08 MED ORDER — ERYTHROMYCIN 5 MG/GM OP OINT
TOPICAL_OINTMENT | Freq: Four times a day (QID) | OPHTHALMIC | Status: DC
  Filled 2020-02-08: qty 1

## 2020-02-08 MED ORDER — CHLORHEXIDINE GLUCONATE CLOTH 2 % EX PADS
6.0000 | MEDICATED_PAD | Freq: Every day | CUTANEOUS | Status: DC
  Administered 2020-02-09 – 2020-02-10 (×2): 6 via TOPICAL

## 2020-02-08 MED ORDER — ENOXAPARIN SODIUM 40 MG/0.4ML ~~LOC~~ SOLN
40.0000 mg | SUBCUTANEOUS | Status: DC
  Administered 2020-02-08 – 2020-02-10 (×3): 40 mg via SUBCUTANEOUS
  Filled 2020-02-08 (×3): qty 0.4

## 2020-02-08 MED ORDER — SODIUM CHLORIDE 0.9 % IV SOLN
2.0000 g | Freq: Two times a day (BID) | INTRAVENOUS | Status: DC
  Administered 2020-02-08 – 2020-02-09 (×3): 2 g via INTRAVENOUS
  Filled 2020-02-08 (×3): qty 2

## 2020-02-08 MED ORDER — SODIUM CHLORIDE 0.9 % IV SOLN: INTRAVENOUS | Status: DC

## 2020-02-08 MED ORDER — METRONIDAZOLE IN NACL 5-0.79 MG/ML-% IV SOLN
500.0000 mg | Freq: Three times a day (TID) | INTRAVENOUS | Status: DC
  Administered 2020-02-08 – 2020-02-09 (×3): 500 mg via INTRAVENOUS
  Filled 2020-02-08 (×3): qty 100

## 2020-02-08 MED ORDER — VANCOMYCIN HCL 1250 MG/250ML IV SOLN
1250.0000 mg | INTRAVENOUS | Status: DC
  Administered 2020-02-09: 1250 mg via INTRAVENOUS
  Filled 2020-02-08 (×2): qty 250

## 2020-02-08 NOTE — Progress Notes (Signed)
Pharmacy Antibiotic Note  Christian Bennett is a 86 y.o. male admitted on 02/07/2020 with sepsis.  Pharmacy has been consulted for Vancomycin dosing.  Plan: Vancomycin 1.5gm iv x1, then Vancomycin  1250 mg IV Q 24 hrs. Goal AUC 400-550. Expected AUC: 536 SCr used: 1.07  Cefepime 2gm iv x1, then 2gm iv q12hr   Height: 5\' 8"  (172.7 cm) Weight: 77.1 kg (170 lb) IBW/kg (Calculated) : 68.4  Temp (24hrs), Avg:93 F (33.9 C), Min:90.1 F (32.3 C), Max:94 F (34.4 C)  Recent Labs  Lab 02/07/20 2150 02/07/20 2203  WBC  --  3.1*  CREATININE  --  1.07  LATICACIDVEN 1.4  --     Estimated Creatinine Clearance: 45.3 mL/min (by C-G formula based on SCr of 1.07 mg/dL).    Allergies  Allergen Reactions  . Doxycycline Hives  . Eggs Or Egg-Derived Products     UNSPECIFIED REACTION  >> "Sick"    Antimicrobials this admission: Vancomycin 02/08/2020 >> Cefepime 02/08/2020 >>   Dose adjustments this admission: -  Microbiology results: -  Thank you for allowing pharmacy to be a part of this patient's care.  Christian Bennett 02/08/2020 4:47 AM

## 2020-02-08 NOTE — ED Notes (Signed)
Pt repositioned in bed. Depends dry. Stat lock replaced as pt broke previous stat lock.

## 2020-02-08 NOTE — H&P (Signed)
History and Physical    Christian Bennett [id number] DOB: 02-17-1928 DOA: 02/07/2020  PCP: Roetta Sessions, NP Patient coming from: SNF  I have personally briefly reviewed patient's old medical records in [place]  Chief Complaint: fall  HPI: Christian Bennett is an 86 yo CM with PMH dementia, hypothyroidism, hypertension, coronary artery stenosis, GERD, recurrent falls, CKD 3, history of CVA, mood disorder who presents to the ER after a fall at his SNF, [facility].  The patient had a witnessed fall out of his wheelchair and hit his head. He is reported to be typically alert, ambulatory, and conversive/interactive with the staff.  It was also reported that he has been becoming more agitated recently and had also been given Ativan at times for treatment of this agitation. HPI is gathered fully from chart review as the patient is unable to provide any meaningful collateral information.  He is unable to provide any ROS either and is moaning in bed.  He does arouse to a sternal rub stating to stop doing that however falls back asleep quickly and cannot engage in conversation. He was unable to provide a urine specimen due to incontinence.  A Foley catheter was inserted while in the ER and during exam his urine was noted to be very foul-smelling.  Due to his fall he underwent imaging studies while in the ER.  He underwent CT head and C-spine.  There was no acute abnormality noted but stable prior infarcts involving the right frontal parietal and occipital lobes as well as scattered bilateral basal ganglia lacunar infarcts. Chest x-ray was clear with no focal infiltrates or effusions.  Notable labs include: Lactic acid 1.4, troponin 11 WBC 3.1, hemoglobin 8.2, platelets 139 Chemistries unremarkable Urinalysis negative for nitrites and leukocyte esterase COVID-19 and flu swabs negative  He was given a dose of vancomycin, cefepime, Flagyl and admitted for further work-up for possible  sepsis.    Review of Systems: As per HPI otherwise 10 point review of systems negative.   Past Medical History:  Diagnosis Date  . Acid reflux   . Carotid artery occlusion   . Hypertension   . Hypothyroidism   . Thyroid disease     Past Surgical History:  Procedure Laterality Date  . CAROTID ENDARTERECTOMY Right 07/25/2019  . COLONOSCOPY    . ENDARTERECTOMY Right 07/25/2019   Procedure: RIGHT ENDARTERECTOMY CAROTID with patch angioplasty;  Surgeon: Rosetta Posner, MD;  Location: [facility];  Service: Vascular;  Laterality: Right;  . ESOPHAGOGASTRODUODENOSCOPY    . ESOPHAGOGASTRODUODENOSCOPY Left 11/13/2019   Procedure: ESOPHAGOGASTRODUODENOSCOPY (EGD);  Surgeon: Carol Ada, MD;  Location: [facility];  Service: Endoscopy;  Laterality: Left;  . EYE SURGERY Bilateral    cataracts  . INGUINAL HERNIA REPAIR    . LIPOMA EXCISION    . MELANOMA EXCISION    . REPLACEMENT TOTAL KNEE BILATERAL       reports that he quit smoking about 68 years ago. He has quit using smokeless tobacco.  His smokeless tobacco use included chew. He reports current alcohol use. He reports that he does not use drugs.  Allergies  Allergen Reactions  . Doxycycline Hives  . Eggs Or Egg-Derived Products     UNSPECIFIED REACTION  >> "Sick"    Family History  Problem Relation Age of Onset  . Stroke Mother   . Stroke Father     Prior to Admission medications   Medication Sig Start Date End Date Taking? Authorizing Provider  amLODipine (NORVASC) 5 MG tablet  Take 1 tablet (5 mg total) by mouth daily. 11/14/19   Rai, Vernelle Emerald, MD  atorvastatin (LIPITOR) 40 MG tablet Take 1 tablet (40 mg total) by mouth daily at 6 PM. No more rfs-->  Future Refills per long term care facility pt is entering jan 25-27, 2021 10/25/19   Opalski, Deborah, DO  divalproex (DEPAKOTE) 125 MG DR tablet Take 1 tablet (125 mg total) by mouth daily. Patient taking differently: Take 125 mg by mouth 3 (three) times daily.  10/01/19   Frann Rider, NP  fluticasone (FLONASE) 50 MCG/ACT nasal spray Place 2 sprays into both nostrils daily as needed for allergies or rhinitis.    [provider]  iron polysaccharides (NIFEREX) 150 MG capsule Take 1 capsule (150 mg total) by mouth daily. 11/14/19   Rai, Vernelle Emerald, MD  lipase/protease/amylase (CREON) 36000 UNITS CPEP capsule Take 36,000 Units by mouth 3 (three) times daily with meals.    [provider]  LORazepam (ATIVAN) 0.5 MG tablet Take 0.25 mg by mouth 2 (two) times daily as needed for anxiety.    [provider]  magnesium oxide (MAG-OX) 400 (241.3 Mg) MG tablet Take 1 tablet (400 mg total) by mouth daily. 07/12/19   Eulogio Bear U, DO  melatonin 5 MG TABS Take 10 mg by mouth at bedtime.     [provider]  omeprazole-sodium bicarbonate (ZEGERID) 40-1100 MG capsule Take 1 capsule by mouth 2 (two) times daily before a meal. Transition to once daily after 1 month 11/19/19 01/18/20  Elodia Florence., MD  ondansetron (ZOFRAN) 4 MG tablet Take 4 mg by mouth daily as needed for nausea or vomiting.    [provider]  QUEtiapine (SEROQUEL) 25 MG tablet Take 0.5 tablets (12.5 mg total) by mouth at bedtime. Patient taking differently: Take 25 mg by mouth at bedtime.  11/14/19   Rai, Ripudeep K, MD  risperiDONE (RISPERDAL) 0.25 MG tablet Take 0.25 mg by mouth in the morning, at noon, and at bedtime.    [provider]  senna (SENOKOT) 8.6 MG TABS tablet Take 1 tablet (8.6 mg total) by mouth 2 (two) times daily. 11/14/19   Rai, Vernelle Emerald, MD  sertraline (ZOLOFT) 25 MG tablet Take 1 tablet (25 mg total) by mouth daily. No more rfs-->  Future Refills per long term care facility pt is entering jan 25-27, 2021 Patient taking differently: Take 25 mg by mouth daily.  10/25/19   Mellody Dance, DO  Skin Protectants, Misc. (EUCERIN) cream APPLY TWICE DAILY AS NEEDED FOR RASH OR ITCHING Patient taking differently: Apply 1 application topically  every 12 (twelve) hours as needed (rash or itching).  12/04/18   Mellody Dance, DO  thyroid (NP THYROID) 60 MG tablet TAKE 1 TABLET BY MOUTH ONCE DAILY BEFORE BREAKFAST  No more rfs-->  Future Refills per long term care facility pt is entering jan 25-27, 2021 Patient taking differently: Take 60 mg by mouth daily before breakfast.  10/25/19   Opalski, Neoma Laming, DO  triamcinolone cream (KENALOG) 0.1 % Apply 1 application topically as needed (rash).    [provider]  Vitamin D, Ergocalciferol, (DRISDOL) 1.25 MG (50000 UNIT) CAPS capsule Take 1 capsule (50,000 Units total) by mouth every 7 (seven) days. No more rfs-->  Future Refills per long term care facility pt is entering jan 25-27, 2021 Patient taking differently: Take 50,000 Units by mouth every 7 (seven) days.  10/25/19   Mellody Dance, DO    Physical Exam:  Vitals:   02/08/20 0200 02/08/20 0215 02/08/20 0230 02/08/20 0245  BP: 128/64 129/66 (!) 120/59 (!) 128/54  Pulse: 65 61 62 (!) 58  Resp: 20 14 18 13   Temp: (!) 93 F (33.9 C) (!) 93.1 F (33.9 C) (!) 93.2 F (34 C) (!) 93.3 F (34.1 C)  TempSrc:      SpO2: 97% 98% 95% 95%  Weight:      Height:       General appearance: Laying in bed in no distress but is only responsive to sternal rub Head: Normocephalic, without obvious abnormality Eyes: Crusting and purulent drainage appreciated in left eye Lungs: clear to auscultation bilaterally Heart: regular rate and rhythm and S1, S2 normal Abdomen: Soft, nontender, nondistended, bowel sounds present Extremities: No edema Skin: mobility and turgor normal Neurologic: Moves all 4 extremities spontaneously but will not follow commands  Labs on Admission: I have personally reviewed following labs and imaging studies  CBC: Recent Labs  Lab 02/07/20 2203  WBC 3.1*  NEUTROABS 1.9  HGB 8.2*  HCT 28.0*  MCV 87.2  PLT [id number]*   Basic Metabolic Panel: Recent Labs  Lab 02/07/20 2203  NA 142  K 4.4  CL 103  CO2 30    GLUCOSE 98  BUN 24*  CREATININE 1.07  CALCIUM 9.9   GFR: Estimated Creatinine Clearance: 45.3 mL/min (by C-G formula based on SCr of 1.07 mg/dL). Liver Function Tests: Recent Labs  Lab 02/07/20 2203  AST 27  ALT 23  ALKPHOS 88  BILITOT 0.3  PROT 7.3  ALBUMIN 3.7   No results for input(s): LIPASE, AMYLASE in the last 168 hours. No results for input(s): AMMONIA in the last 168 hours. Coagulation Profile: Recent Labs  Lab 02/07/20 2203  INR 1.2   Cardiac Enzymes: No results for input(s): CKTOTAL, CKMB, CKMBINDEX, TROPONINI in the last 168 hours. BNP (last 3 results) No results for input(s): PROBNP in the last 8760 hours. HbA1C: No results for input(s): HGBA1C in the last 72 hours. CBG: No results for input(s): GLUCAP in the last 168 hours. Lipid Profile: No results for input(s): CHOL, HDL, LDLCALC, TRIG, CHOLHDL, LDLDIRECT in the last 72 hours. Thyroid Function Tests: No results for input(s): TSH, T4TOTAL, FREET4, T3FREE, THYROIDAB in the last 72 hours. Anemia Panel: No results for input(s): VITAMINB12, FOLATE, FERRITIN, TIBC, IRON, RETICCTPCT in the last 72 hours. Urine analysis:    Component Value Date/Time   COLORURINE YELLOW 02/07/2020 2220   APPEARANCEUR HAZY (A) 02/07/2020 2220   LABSPEC 1.016 02/07/2020 2220   PHURINE 7.0 02/07/2020 2220   GLUCOSEU NEGATIVE 02/07/2020 2220   HGBUR NEGATIVE 02/07/2020 2220   BILIRUBINUR NEGATIVE 02/07/2020 2220   KETONESUR NEGATIVE 02/07/2020 2220   PROTEINUR NEGATIVE 02/07/2020 2220   UROBILINOGEN 0.2 02/18/2011 1015   NITRITE NEGATIVE 02/07/2020 2220   LEUKOCYTESUR NEGATIVE 02/07/2020 2220    Radiological Exams on Admission: DG Chest 1 View  Result Date: 02/07/2020 CLINICAL DATA:  Witnessed fall from wheelchair EXAM: CHEST  1 VIEW COMPARISON:  CT 11/16/2019 FINDINGS: Chronically coarsened interstitial changes, similar to prior. No consolidation, features of edema, pneumothorax, or effusion. Stable cardiomediastinal  contours. The osseous structures appear diffusely demineralized which may limit detection of small or nondisplaced fractures. No acute osseous abnormality or suspicious osseous lesion. Degenerative changes are present in the imaged spine and shoulders. Telemetry leads overlie the chest. IMPRESSION: No acute cardiopulmonary or traumatic findings in the chest. Chronically coarsened interstitium. Electronically Signed   By: Elwin Sleight.D.  On: 02/07/2020 22:54   CT Head Wo Contrast  Result Date: 02/07/2020 CLINICAL DATA:  Altered mental status, fall from wheelchair EXAM: CT HEAD WITHOUT CONTRAST CT CERVICAL SPINE WITHOUT CONTRAST TECHNIQUE: Multidetector CT imaging of the head and cervical spine was performed following the standard protocol without intravenous contrast. Multiplanar CT image reconstructions of the cervical spine were also generated. COMPARISON:  CT 01/13/2020 FINDINGS: CT HEAD FINDINGS Motion degraded imaging despite several attempts at acquisition Brain: Stable sequela of prior infarcts of the right frontal parietal vertex and occipital lobe as well as scattered remote bilateral lacunar infarcts in the basal ganglia. No evidence of acute infarction, hemorrhage, hydrocephalus, extra-axial collection or mass lesion/mass effect. Symmetric prominence of the ventricles, cisterns and sulci compatible with moderate parenchymal volume loss. Patchy and confluent areas of white matter hypoattenuation are most compatible with chronic microvascular angiopathy. Vascular: Atherosclerotic calcification of the carotid siphons and intradural vertebral arteries. No hyperdense vessel. Skull: Motion degraded imaging. No large hematoma or focal swelling. No calvarial fracture or suspicious osseous lesions. Sinuses/Orbits: Paranasal sinuses and mastoid air cells are predominantly clear. Orbital structures are unremarkable aside from prior lens extractions. Other: None CT CERVICAL SPINE FINDINGS Alignment:  Preservation of the normal cervical lordosis without traumatic listhesis. No abnormally widened, perched or jumped facets. Normal alignment of the craniocervical and atlantoaxial articulations accounting for mild leftward cranial rotation of. Skull base and vertebrae: Within the limitations of motion artifact, no visible skull base or vertebral fracture is seen. The dens is intact. Multilevel flowing anterior osteophytosis, compatible with features of diffuse idiopathic skeletal hyperostosis (DISH). Bony fusion across the C4-5 articular facets is noted as well. No worrisome osseous lesions. Soft tissues and spinal canal: No pre or paravertebral fluid or swelling. No visible canal hematoma. Pannus formation about the dens is stable from priors. Disc levels: Disc osteophyte complex at C2-3 with posterior ligamentum flavum hypertrophy resulting in at most mild stenosis at this level. No other significant canal stenosis. Some uncinate spurring and more pronounced facet hypertrophic changes result in mild right foraminal narrowing C2-3, bilateral foraminal narrowing at C5-6. Upper chest: No acute abnormality in the upper chest or imaged lung apices. Biapical pleuroparenchymal scarring is similar to priors. Postsurgical changes of the right neck likely from prior endarterectomy. Extensive calcification at the left carotid bifurcation. Other: Extensive calcifications about the tonsils may reflect calcified tonsilloliths. Diminutive but otherwise normal thyroid. IMPRESSION: CT head: 1. Motion degraded imaging despite several attempts at acquisition. 2. No evidence of acute intracranial abnormality. 3. Stable sequela of prior infarcts of the right frontal parietal vertex and occipital lobe as well as scattered remote bilateral basal ganglia lacunar infarcts. 4. Moderate parenchymal volume loss and chronic microvascular angiopathy. CT cervical spine: 1. No evidence of acute fracture or traumatic listhesis of the cervical  spine. 2. Multilevel flowing anterior osteophytosis, compatible with features of diffuse idiopathic skeletal hyperostosis (DISH). 3. Cervical spondylitic changes, detailed above. 4. Cervical and intracranial atherosclerosis. 5. Postsurgical changes of the right neck likely from prior endarterectomy. Electronically Signed   By: Lovena Le M.D.   On: 02/07/2020 23:05   CT Cervical Spine Wo Contrast  Result Date: 02/07/2020 CLINICAL DATA:  Altered mental status, fall from wheelchair EXAM: CT HEAD WITHOUT CONTRAST CT CERVICAL SPINE WITHOUT CONTRAST TECHNIQUE: Multidetector CT imaging of the head and cervical spine was performed following the standard protocol without intravenous contrast. Multiplanar CT image reconstructions of the cervical spine were also generated. COMPARISON:  CT 01/13/2020 FINDINGS: CT HEAD FINDINGS Motion  degraded imaging despite several attempts at acquisition Brain: Stable sequela of prior infarcts of the right frontal parietal vertex and occipital lobe as well as scattered remote bilateral lacunar infarcts in the basal ganglia. No evidence of acute infarction, hemorrhage, hydrocephalus, extra-axial collection or mass lesion/mass effect. Symmetric prominence of the ventricles, cisterns and sulci compatible with moderate parenchymal volume loss. Patchy and confluent areas of white matter hypoattenuation are most compatible with chronic microvascular angiopathy. Vascular: Atherosclerotic calcification of the carotid siphons and intradural vertebral arteries. No hyperdense vessel. Skull: Motion degraded imaging. No large hematoma or focal swelling. No calvarial fracture or suspicious osseous lesions. Sinuses/Orbits: Paranasal sinuses and mastoid air cells are predominantly clear. Orbital structures are unremarkable aside from prior lens extractions. Other: None CT CERVICAL SPINE FINDINGS Alignment: Preservation of the normal cervical lordosis without traumatic listhesis. No abnormally widened,  perched or jumped facets. Normal alignment of the craniocervical and atlantoaxial articulations accounting for mild leftward cranial rotation of. Skull base and vertebrae: Within the limitations of motion artifact, no visible skull base or vertebral fracture is seen. The dens is intact. Multilevel flowing anterior osteophytosis, compatible with features of diffuse idiopathic skeletal hyperostosis (DISH). Bony fusion across the C4-5 articular facets is noted as well. No worrisome osseous lesions. Soft tissues and spinal canal: No pre or paravertebral fluid or swelling. No visible canal hematoma. Pannus formation about the dens is stable from priors. Disc levels: Disc osteophyte complex at C2-3 with posterior ligamentum flavum hypertrophy resulting in at most mild stenosis at this level. No other significant canal stenosis. Some uncinate spurring and more pronounced facet hypertrophic changes result in mild right foraminal narrowing C2-3, bilateral foraminal narrowing at C5-6. Upper chest: No acute abnormality in the upper chest or imaged lung apices. Biapical pleuroparenchymal scarring is similar to priors. Postsurgical changes of the right neck likely from prior endarterectomy. Extensive calcification at the left carotid bifurcation. Other: Extensive calcifications about the tonsils may reflect calcified tonsilloliths. Diminutive but otherwise normal thyroid. IMPRESSION: CT head: 1. Motion degraded imaging despite several attempts at acquisition. 2. No evidence of acute intracranial abnormality. 3. Stable sequela of prior infarcts of the right frontal parietal vertex and occipital lobe as well as scattered remote bilateral basal ganglia lacunar infarcts. 4. Moderate parenchymal volume loss and chronic microvascular angiopathy. CT cervical spine: 1. No evidence of acute fracture or traumatic listhesis of the cervical spine. 2. Multilevel flowing anterior osteophytosis, compatible with features of diffuse idiopathic  skeletal hyperostosis (DISH). 3. Cervical spondylitic changes, detailed above. 4. Cervical and intracranial atherosclerosis. 5. Postsurgical changes of the right neck likely from prior endarterectomy. Electronically Signed   By: Lovena Le M.D.   On: 02/07/2020 23:05    EKG: Independently reviewed. Sinus bradycardia; prolonged PR interval  Assessment/Plan  Sepsis with unknown source - patient presents with AMS, bradycardic, neutropenic, hypotensive, and hypothermic - unclear source if any at this time, but prudent to treat with empiric abx and follow up cultures - UA surprisingly negative after cath specimen; initially thought to be the source - continue vanc/cefepime/flagyl for now and d/c or de-escalate as indicated/able -Continue Bair hugger -Avoid further sedating agents.  Do not see chronic benzos on the database; unclear how long he has been receiving at the SNF  Acute encephalopathy - presumed toxic metabolic at this time - continue abx, IVF, and sepsis workup as above - if still doesn't clear may need to consider non-convulsive seizures given encephalomalacia from previous stroke and recurrent falls  Hypothyroidism -Check TSH -Await  med rec and resume home meds  Hypertension -Hold home meds in setting of hypotension   DVT prophylaxis: enoxaparin (Lovenox) 40mg  SQ 2 hours prior to surgery then every day Code Status: DNR Family Communication: none Disposition Plan: SNF Consults called: none Admission status: Med-Surg   Dwyane Dee, MD [facility] Pager: Secure chat via Amion Or pager # : [phone]  If 7PM-7AM, please contact night-coverage [email or web address] Use universal  password for that web site. If you do not have the password, please call the hospital operator.  02/08/2020, 3:08 AM

## 2020-02-08 NOTE — Plan of Care (Signed)
  Problem: Fluid Volume: Goal: Hemodynamic stability will improve Outcome: Progressing   Problem: Clinical Measurements: Goal: Signs and symptoms of infection will decrease Outcome: Progressing   Problem: Respiratory: Goal: Ability to maintain adequate ventilation will improve Outcome: Progressing   

## 2020-02-08 NOTE — ED Notes (Signed)
Pt repositioned in bed at this time. NAD noted. Equal rise and fall of chest noted.

## 2020-02-08 NOTE — Progress Notes (Signed)

## 2020-02-08 NOTE — Progress Notes (Signed)
Care started in the emergency room prior to midnight and patient was admitted early this morning after midnight by my partner and colleague Dr. Dwyane Dee and I am in current agreement with this assessment and plan.  Additional changes of plan of care been made accordingly.  The patient is a 86 year old elderly Caucasian male from a SNF with a past medical history significant for but not limited to dementia, hypothyroidism, hypertension, coronary artery stenosis, GERD, recurrent falls, history, history of CVA, mood disorder as well as other comorbidities who presents to the ED after a fall at his SNF.  Patient had a witnessed fall out of his wheelchair and hit his head reportedly he is typically alert, ambulatory, and conversive and interactive with staff.  Reportedly he became more agitated recently had been given Ativan for treatment of his agitation.  Currently he is unable to provide a subjective history due to his status.  He underwent imaging studies while he was in the ED and had a head CT and a C-spine.  There is no acute abnormality but there is a stable infarcts involving the right frontal parietal and occipital lobes as well as scattered bilateral basal ganglia lacunar infarcts.  Hypothermic in the ED as well with a temperature of 90.  He was admitted for the following and currently being treated for but not limited to:  Sepsis with unknown source - patient presents with AMS, bradycardic, neutropenic, hypotensive, and hypothermic - unclear source if any at this time, but prudent to treat with empiric abx and follow up cultures - UA surprisingly negative after cath specimen; initially thought to be the source - continue vanc/cefepime/flagyl for now and d/c or de-escalate as indicated/able -Continue Bair hugger given his Hypothermia -Avoid further sedating agents.  Do not see chronic benzos on the database; unclear how long he has been receiving at the SNF -C/w Fluid Hydration with D5 1/2 NS at  75 mL/hr  Acute Encephalopathy - presumed toxic metabolic at this time - continue abx as above, IVF, and sepsis workup as above - if still doesn't clear may need to consider non-convulsive seizures given encephalomalacia from previous stroke and recurrent falls - Patient Still Enecphalopathic - Check RPR, B12, and Ammonia Level  - Place on [place] Precautions -Will Get SLP   Hypothyroidism -Checked TSH and was 6.965 -Await med rec and resume home meds -Takes NP Thyroid 25 mg po Daily  -Will check Free T4 and T3 prior initiating Thyroid Replacement   Hypertension -Hold home meds in setting of hypotension (BPwas 98/55)  Pancytopenia  -Mild -Patient's WBC was 3.2, hemoglobin/hematocrit dropped to 7.5/24.5 and platelet count was 121,000 -Check anemia panel in a.m. -Continue to monitor for signs and symptoms of bleeding; currently no overt bleeding or -Likely this is in the setting of his sepsis -Repeat CBC with differential in the a.m.  Hypoglycemia -Mild with a Blood Sugar of 60 -Changed IVF to have D5W -Continue to Monitor Blood Sugars Carefully  We will continue To monitor patient's clinical response to intervention repeat blood work in a chest x-ray in the a.m. and follow-up on rest of the labs that are been ordered.

## 2020-02-08 NOTE — ED Notes (Addendum)
Attempted to call report at [facility]- on hold for 5 minutes and 45 seconds. Attempted to call report back at 1854- No answer.  Handoff report states to call Christina @[phone]  on 4E room 1403. Bed assigned at [facility]

## 2020-02-08 NOTE — ED Notes (Signed)
Gaye Alken, daughter, [phone], would like to know why they are admitting her father, said she is his POA.

## 2020-02-08 NOTE — ED Notes (Addendum)
Judson Roch- Pts daughter at bedside. Daughter updated on plan of care. Oral care provided

## 2020-02-08 NOTE — Hospital Course (Signed)
Christian Bennett is an 86 yo CM with PMH dementia, hypothyroidism, hypertension, coronary artery stenosis, GERD, recurrent falls, CKD 3, history of CVA, mood disorder who presents to the ER after a fall at his SNF, [place].  The patient had a witnessed fall out of his wheelchair and hit his head. He is reported to be typically alert, ambulatory, and conversive/interactive with the staff.  It was also reported that he has been becoming more agitated recently and had also been given Ativan at times for treatment of this agitation. HPI is gathered fully from chart review as the patient is unable to provide any meaningful collateral information.  He is unable to provide any ROS either and is moaning in bed.  He does arouse to a sternal rub stating to stop doing that however falls back asleep quickly and cannot engage in conversation. He was unable to provide a urine specimen due to incontinence.  A Foley catheter was inserted while in the ER and during exam his urine was noted to be very foul-smelling.  Due to his fall he underwent imaging studies while in the ER.  He underwent CT head and C-spine.  There was no acute abnormality noted but stable prior infarcts involving the right frontal parietal and occipital lobes as well as scattered bilateral basal ganglia lacunar infarcts. Chest x-ray was clear with no focal infiltrates or effusions.  Notable labs include: Lactic acid 1.4, troponin 11 WBC 3.1, hemoglobin 8.2, platelets 139 Chemistries unremarkable Urinalysis negative for nitrites and leukocyte esterase COVID-19 and flu swabs negative  He was given a dose of vancomycin, cefepime, Flagyl and admitted for further work-up for possible sepsis.

## 2020-02-09 DIAGNOSIS — G9340 Encephalopathy, unspecified: Secondary | ICD-10-CM

## 2020-02-09 DIAGNOSIS — I63231 Cerebral infarction due to unspecified occlusion or stenosis of right carotid arteries: Secondary | ICD-10-CM

## 2020-02-09 DIAGNOSIS — Z9889 Other specified postprocedural states: Secondary | ICD-10-CM

## 2020-02-09 DIAGNOSIS — R6510 Systemic inflammatory response syndrome (SIRS) of non-infectious origin without acute organ dysfunction: Secondary | ICD-10-CM

## 2020-02-09 DIAGNOSIS — F0150 Vascular dementia without behavioral disturbance: Secondary | ICD-10-CM

## 2020-02-09 DIAGNOSIS — I10 Essential (primary) hypertension: Secondary | ICD-10-CM

## 2020-02-09 DIAGNOSIS — E039 Hypothyroidism, unspecified: Secondary | ICD-10-CM

## 2020-02-09 DIAGNOSIS — F09 Unspecified mental disorder due to known physiological condition: Secondary | ICD-10-CM

## 2020-02-09 LAB — IRON AND TIBC
Iron: 72 ug/dL (ref 45–182)
Saturation Ratios: 22 % (ref 17.9–39.5)
TIBC: 328 ug/dL (ref 250–450)
UIBC: 256 ug/dL

## 2020-02-09 LAB — RETICULOCYTES
Immature Retic Fract: 7.3 % (ref 2.3–15.9)
RBC.: 3.02 MIL/uL — ABNORMAL LOW (ref 4.22–5.81)
Retic Count, Absolute: 28.1 10*3/uL (ref 19.0–186.0)
Retic Ct Pct: 0.9 % (ref 0.4–3.1)

## 2020-02-09 LAB — URINE CULTURE: Culture: NO GROWTH

## 2020-02-09 LAB — COMPREHENSIVE METABOLIC PANEL
ALT: 18 U/L (ref 0–44)
AST: 19 U/L (ref 15–41)
Albumin: 3 g/dL — ABNORMAL LOW (ref 3.5–5.0)
Alkaline Phosphatase: 69 U/L (ref 38–126)
Anion gap: 8 (ref 5–15)
BUN: 13 mg/dL (ref 8–23)
CO2: 27 mmol/L (ref 22–32)
Calcium: 8.9 mg/dL (ref 8.9–10.3)
Chloride: 102 mmol/L (ref 98–111)
Creatinine, Ser: 1.19 mg/dL (ref 0.61–1.24)
GFR calc Af Amer: >60 mL/min (ref >60)
GFR calc non Af Amer: 54 mL/min — ABNORMAL LOW (ref >60)
Glucose, Bld: 92 mg/dL (ref 70–99)
Potassium: 3.8 mmol/L (ref 3.5–5.1)
Sodium: 137 mmol/L (ref 135–145)
Total Bilirubin: 0.5 mg/dL (ref 0.3–1.2)
Total Protein: 6 g/dL — ABNORMAL LOW (ref 6.5–8.1)

## 2020-02-09 LAB — BLOOD GAS, ARTERIAL
Acid-Base Excess: 4.6 mmol/L — ABNORMAL HIGH (ref 0.0–2.0)
Bicarbonate: 28.9 mmol/L — ABNORMAL HIGH (ref 20.0–28.0)
Drawn by: 257701
FIO2: 21
O2 Saturation: 92.6 %
Patient temperature: 98.9
pCO2 arterial: 44.2 mmHg (ref 32.0–48.0)
pH, Arterial: 7.431 (ref 7.350–7.450)
pO2, Arterial: 70.1 mmHg — ABNORMAL LOW (ref 83.0–108.0)

## 2020-02-09 LAB — CBC WITH DIFFERENTIAL/PLATELET
Abs Immature Granulocytes: 0.01 10*3/uL (ref 0.00–0.07)
Basophils Absolute: 0 10*3/uL (ref 0.0–0.1)
Basophils Relative: 1 %
Eosinophils Absolute: 0.1 10*3/uL (ref 0.0–0.5)
Eosinophils Relative: 3 %
HCT: 25.4 % — ABNORMAL LOW (ref 39.0–52.0)
Hemoglobin: 7.9 g/dL — ABNORMAL LOW (ref 13.0–17.0)
Immature Granulocytes: 0 %
Lymphocytes Relative: 19 %
Lymphs Abs: 0.8 10*3/uL (ref 0.7–4.0)
MCH: 26.3 pg (ref 26.0–34.0)
MCHC: 31.1 g/dL (ref 30.0–36.0)
MCV: 84.7 fL (ref 80.0–100.0)
Monocytes Absolute: 0.7 10*3/uL (ref 0.1–1.0)
Monocytes Relative: 16 %
Neutro Abs: 2.7 10*3/uL (ref 1.7–7.7)
Neutrophils Relative %: 61 %
Platelets: 125 10*3/uL — ABNORMAL LOW (ref 150–400)
RBC: 3 MIL/uL — ABNORMAL LOW (ref 4.22–5.81)
RDW: 19.3 % — ABNORMAL HIGH (ref 11.5–15.5)
WBC: 4.4 10*3/uL (ref 4.0–10.5)
nRBC: 0 % (ref 0.0–0.2)

## 2020-02-09 LAB — MAGNESIUM: Magnesium: 1.5 mg/dL — ABNORMAL LOW (ref 1.7–2.4)

## 2020-02-09 LAB — MRSA PCR SCREENING: MRSA by PCR: NEGATIVE

## 2020-02-09 LAB — AMMONIA: Ammonia: 21 umol/L (ref 9–35)

## 2020-02-09 LAB — VITAMIN B12: Vitamin B-12: 783 pg/mL (ref 180–914)

## 2020-02-09 LAB — FOLATE: Folate: 11.5 ng/mL (ref >5.9)

## 2020-02-09 LAB — FERRITIN: Ferritin: 31 ng/mL (ref 24–336)

## 2020-02-09 LAB — RPR: RPR Ser Ql: NONREACTIVE

## 2020-02-09 LAB — PHOSPHORUS: Phosphorus: 3 mg/dL (ref 2.5–4.6)

## 2020-02-09 LAB — PROCALCITONIN: Procalcitonin: <0.1 ng/mL

## 2020-02-09 LAB — T3: T3, Total: 60 ng/dL — ABNORMAL LOW (ref 71–180)

## 2020-02-09 MED ORDER — LEVOTHYROXINE SODIUM 100 MCG/5ML IV SOLN
25.0000 ug | Freq: Every day | INTRAVENOUS | Status: DC
  Administered 2020-02-09 – 2020-02-10 (×2): 25 ug via INTRAVENOUS
  Filled 2020-02-09 (×2): qty 5

## 2020-02-09 MED ORDER — MAGNESIUM SULFATE 2 GM/50ML IV SOLN: 2.0000 g | Freq: Once | INTRAVENOUS | Status: DC

## 2020-02-09 MED ORDER — MAGNESIUM SULFATE 50 % IJ SOLN
3.0000 g | Freq: Once | INTRAVENOUS | Status: AC
  Administered 2020-02-09: 3 g via INTRAVENOUS
  Filled 2020-02-09: qty 6

## 2020-02-09 NOTE — Evaluation (Signed)
SLP Cancellation Note  Patient Details Name: Melachi Tees MRN: [id number] DOB: [birth year]   Cancelled treatment:       Reason Eval/Treat Not Completed: Other (comment)(per discussion with RN, pt has been lethargic, will not remain alert, RN also states daughter reported pt would cough/expectorate secretions/phlegm PTA, but he is not currently - This may be concerning for secretion aspiration  SLP secure chatted MD and RN and obtained approval from MD to defer evaluation until pt's mentation allows.  Will continue efforts.  )  Kathleen Lime, MS [facility] SLP Acute Rehab Services Office [phone]  Macario Golds 02/09/2020, 3:11 PM

## 2020-02-09 NOTE — Progress Notes (Signed)
PROGRESS NOTE    Christian Bennett  [id number] DOB: [birth year] DOA: 02/07/2020 PCP: Roetta Sessions, NP   Brief Narrative:  The patient is a 86 year old elderly Caucasian male from a SNF with a past medical history significant for but not limited to dementia, hypothyroidism, hypertension, coronary artery stenosis, GERD, recurrent falls, history, history of CVA, mood disorder as well as other comorbidities who presents to the ED after a fall at his SNF.  Patient had a witnessed fall out of his wheelchair and hit his head reportedly he is typically alert, ambulatory, and conversive and interactive with staff.  Reportedly he became more agitated recently had been given Ativan for treatment of his agitation.  Currently he is unable to provide a subjective history due to his status.  He underwent imaging studies while he was in the ED and had a head CT and a C-spine.  There is no acute abnormality but there is a stable infarcts involving the right frontal parietal and occipital lobes as well as scattered bilateral basal ganglia lacunar infarcts.  Hypothermic in the ED as well with a temperature of 90.  He continues to be encephalopathic and unresponsive and does not really wake up much.  Further work-up will be initiated with an ABG, EEG, as well as a MRI of the brain  Assessment & Plan:   Active Problems:   HTN (hypertension)   Hypothyroidism   CVA (cerebral vascular accident) ([place])   Hyperlipidemia   H/O carotid endarterectomy   Cognitive dysfunction   Sepsis ([place])   Vascular dementia ([place])   Encephalopathy  Initially felt to have sepsis with unknown source however do not acutely believe he is infected; now more likely SIRS - patient presents with AMS, bradycardic, neutropenic, hypotensive, and hypothermic - unclear source if any at this time, but prudent to treat with empiric abx and follow up cultures; we will stop all antibiotics now and observe - UA surprisingly negative after cath  specimen; initially thought to be the source - Initially continued vanc/cefepime/flagyl but will D/C now  -Continue Bair hugger given his Hypothermia if needed -Avoid further sedating agents. Do not see chronic benzos on the database;unclear how long he has been receiving at the SNF -C/w Fluid Hydration with D5 1/2 NS at 75 mL/hr for now -Procalcitonin level was less than 0.10x2 and we will likely de-escalate antibiotics further given that he has unremarkable urinalysis, negative and showed no growth and because his blood cultures are no growth to date, he is afebrile now and leukopenia has resolved -Chest x-ray showed "No acute cardiopulmonary or traumatic findings in the chest. Chronically coarsened interstitium."  Acute Encephalopathy superimposed on vascular dementia with behavioral disturbances - presumed toxic metabolic at this time but his electrolytes are now improved; unclear if this is progression of his dementia - continue abx as above, IVF, and sepsis workup as above - if still doesn't clear may need to consider non-convulsive seizures given encephalomalacia from previous stroke and recurrent falls -EEG has been ordered and will also order an MRI of the brain as well as a ABG to rule out hypercarbia or somnolence - Patient Still Encephalopathic so will pursue further testing - Check RPR and was nonreactive, B12 was 783, and Ammonia Level 21 - Place on [place] Precautions -Will Get SLP but is not awake enough so we will obtain an ABG given his somnolence -Follow-up on ABG, EEG, MRI -ABG done and showed pH of 7.431, PCO2 of 44.2, PO2 of 70.1, bicarbonate level 20.9,  and ABG O2 saturation of 92.6% on 21% FiO2 -Palliative care consulted for further evaluation recommendations and to have a GOC Discussion   Hypothyroidism -Checked TSH and was 6.965 and free T4 was 0.70 and T3 was extremely low -Takes NP Thyroid 25 mg po Daily  -We will initiate thyroid replacement for now given IV  Synthroid micrograms  Hypertension -Hold home meds in setting of hypotension (BPwas 98/55) because he is not awake enough  History of a right-sided brain CVA  History of carotid stenosis status post right CEA -Back in March his aspirin was recently discontinued due to concerns of GI bleed Continue with atorvastatin -Follows up with neurology in outpatient setting and with vascular surgery Dr. Donnetta Hutching  Pancytopenia  -Mild -Patient's WBC was 3.2, hemoglobin/hematocrit dropped to 7.5/24.5 and platelet count was 121,000 on admission -Now patient's WBC is 4.4, hemoglobin/hematocrit 7.9/25.4, and platelet count is slowly improving and is now 125,000 -Checked anemia panel and showed an iron level of 72, U IBC of 256, TIBC 328, saturation ratio 22%, ferritin level 31, folate level 11.5 and vitamin B12 783 -Continue to monitor for signs and symptoms of bleeding; currently no overt bleeding or -Likely this is in the setting of his sepsis -Repeat CBC with differential in the a.m.  Hypoglycemia -Mild with a Blood Sugar of 60 today but this is now improved as blood sugar was 92 -Changed IVF to have D5W -Continue to Monitor Blood Sugars Carefully  Hypomagnesemia -Patient magnesium level of 1.5  -Replete with IV mag sulfate 3 g times -Continue to monitor and replete as necessary -Repeat magnesium level in a.m.  DVT prophylaxis: Enoxaparin 40 mg sq q24h Code Status: DO NOT RESUSCITATE  Family Communication: No family present at bedside currently Disposition Plan: Patient is from a memory care unit at [facility] and he is not really responsive or waking up as much. He continues to have persistent encephalopathy and will need a PT OT evaluation as well as an SLP for at least a safe discharge disposition  Status is: Inpatient  Remains inpatient appropriate because:Altered mental status and IV treatments appropriate due to intensity of illness or inability to take PO   Dispo: The patient is  from: SNF              Anticipated d/c is to: SNF              Anticipated d/c date is: 2 days              Patient currently is not medically stable to d/c.  Consultants:   Palliative Care Medicine   Procedures:  EEG  MRI  Antimicrobials:  Anti-infectives (From admission, onward)   Start     Dose/Rate Route Frequency Ordered Stop   02/08/20 2200  vancomycin (VANCOREADY) IVPB 1250 mg/250 mL  Status:  Discontinued     1,250 mg 166.7 mL/hr over 90 Minutes Intravenous Every 24 hours 02/08/20 0447 02/09/20 1333   02/08/20 1000  ceFEPIme (MAXIPIME) 2 g in sodium chloride 0.9 % 100 mL IVPB  Status:  Discontinued     2 g 200 mL/hr over 30 Minutes Intravenous 2 times daily 02/08/20 0300 02/09/20 1333   02/08/20 1000  metroNIDAZOLE (FLAGYL) IVPB 500 mg  Status:  Discontinued     500 mg 100 mL/hr over 60 Minutes Intravenous Every 8 hours 02/08/20 0300 02/09/20 1333   02/08/20 0030  ceFEPIme (MAXIPIME) 2 g in sodium chloride 0.9 % 100 mL IVPB     2  g 200 mL/hr over 30 Minutes Intravenous  Once 02/08/20 0020 02/08/20 0140   02/08/20 0030  metroNIDAZOLE (FLAGYL) IVPB 500 mg     500 mg 100 mL/hr over 60 Minutes Intravenous  Once 02/08/20 0020 02/08/20 0154   02/08/20 0030  vancomycin (VANCOCIN) IVPB 1000 mg/200 mL premix  Status:  Discontinued     1,000 mg 200 mL/hr over 60 Minutes Intravenous  Once 02/08/20 0020 02/08/20 0023   02/08/20 0030  vancomycin (VANCOREADY) IVPB 1500 mg/300 mL     1,500 mg 150 mL/hr over 120 Minutes Intravenous  Once 02/08/20 0023 02/08/20 0349     Subjective: Seen and examined at bedside and he does not really respond very much.  It appears that he is sleeping and he briefly wakes up but then goes back to sleep.  He is a mouth breather.  Does not follow commands or wake up significantly.  No nausea or vomiting.  No other concerns requested this time.  He does mumble when you talk to him but is unclear what he says.  Objective: Vitals:   02/08/20 2250  02/09/20 0050 02/09/20 0450 02/09/20 0842  BP: (!) 144/94 140/78 (!) 160/70 131/86  Pulse: 75 72 86 72  Resp:    18  Temp: (!) 96.2 F (35.7 C) (!) 97 F (36.1 C) 98.8 F (37.1 C) 98.2 F (36.8 C)  TempSrc: Rectal Rectal Axillary Oral  SpO2: 99% 98% 95% 98%  Weight:   69.4 kg   Height:        Intake/Output Summary (Last 24 hours) at 02/09/2020 1338 Last data filed at 02/09/2020 1229 Gross per 24 hour  Intake 2248.92 ml  Output 2350 ml  Net -101.08 ml   Filed Weights   02/07/20 2019 02/08/20 2050 02/09/20 0450  Weight: 77.1 kg 68.9 kg 69.4 kg   Examination: Physical Exam:  Constitutional: Thin elderly chronically ill-appearing Caucasian male currently in NAD and appears calm  Eyes: Lids and conjunctivae normal, sclerae anicteric  ENMT: External Ears, Nose appear normal. Grossly normal hearing. Neck: Appears normal, supple, no cervical masses, normal ROM, no appreciable thyromegaly; no JVD Respiratory: Diminished to auscultation bilaterally with coarse breath sounds and some rhonchi, no wheezing, rales, rhonchi or crackles. Normal respiratory effort and patient is not tachypenic. No accessory muscle use.  Unlabored breathing Cardiovascular: RRR, no murmurs / rubs / gallops. S1 and S2 auscultated. No extremity edema.  Abdomen: Soft, non-tender, non-distended. Bowel sounds positive.  GU: Deferred. Musculoskeletal: No clubbing / cyanosis of digits/nails. No joint deformity upper and lower extremities.  Skin: No rashes, lesions, ulcers on limited skin evaluation. No induration; Warm and dry.  Neurologic: Is not awake enough to follow commands;  Psychiatric: Impaired judgment and insight.  He is not alert and oriented x 3.  Somnolent and difficult to arouse  Data Reviewed: I have personally reviewed following labs and imaging studies  CBC: Recent Labs  Lab 02/07/20 2203 02/08/20 0440 02/09/20 0603  WBC 3.1* 3.2* 4.4  NEUTROABS 1.9 1.9 2.7  HGB 8.2* 7.5* 7.9*  HCT 28.0* 24.5*  25.4*  MCV 87.2 86.9 84.7  PLT 139* 121* 0000000*   Basic Metabolic Panel: Recent Labs  Lab 02/07/20 2203 02/08/20 0440 02/09/20 0603  NA 142 139 137  K 4.4 4.2 3.8  CL 103 103 102  CO2 30 28 27   GLUCOSE 98 60* 92  BUN 24* 20 13  CREATININE 1.07 0.98 1.19  CALCIUM 9.9 8.7* 8.9  MG  --  1.8 1.5*  PHOS  --   --  3.0   GFR: Estimated Creatinine Clearance: 41.3 mL/min (by C-G formula based on SCr of 1.19 mg/dL). Liver Function Tests: Recent Labs  Lab 02/07/20 2203 02/08/20 0440 02/09/20 0603  AST 27 23 19   ALT 23 20 18   ALKPHOS 88 71 69  BILITOT 0.3 0.6 0.5  PROT 7.3 5.9* 6.0*  ALBUMIN 3.7 2.9* 3.0*   No results for input(s): LIPASE, AMYLASE in the last 168 hours. Recent Labs  Lab 02/09/20 0603  AMMONIA 21   Coagulation Profile: Recent Labs  Lab 02/07/20 2203  INR 1.2   Cardiac Enzymes: No results for input(s): CKTOTAL, CKMB, CKMBINDEX, TROPONINI in the last 168 hours. BNP (last 3 results) No results for input(s): PROBNP in the last 8760 hours. HbA1C: No results for input(s): HGBA1C in the last 72 hours. CBG: Recent Labs  Lab 02/08/20 1537  GLUCAP 101*   Lipid Profile: No results for input(s): CHOL, HDL, LDLCALC, TRIG, CHOLHDL, LDLDIRECT in the last 72 hours. Thyroid Function Tests: Recent Labs    02/08/20 0440 02/08/20 1541  TSH 6.965*  --   FREET4  --  0.70   Anemia Panel: Recent Labs    02/09/20 0603  VITAMINB12 783  FOLATE 11.5  FERRITIN 31  TIBC 328  IRON 72  RETICCTPCT 0.9   Sepsis Labs: Recent Labs  Lab 02/07/20 2150 02/08/20 1541 02/09/20 0603  PROCALCITON  --  <0.10 <0.10  LATICACIDVEN 1.4  --   --     Recent Results (from the past 240 hour(s))  Culture, blood (routine x 2)     Status: None (Preliminary result)   Collection Time: 02/07/20 10:03 PM   Specimen: BLOOD  Result Value Ref Range Status   Specimen Description   Final    BLOOD RIGHT ANTECUBITAL Performed at [facility], [place] [address]  [place], [place] [address]    Special Requests   Final    BOTTLES DRAWN AEROBIC AND ANAEROBIC Blood Culture adequate volume Performed at [facility] [address] [place], [place] [address]    Culture   Final    NO GROWTH 1 DAY Performed at [facility], [place] [address] [place], [place] [address]    Report Status PENDING  Incomplete  Culture, blood (routine x 2)     Status: None (Preliminary result)   Collection Time: 02/07/20 10:03 PM   Specimen: BLOOD  Result Value Ref Range Status   Specimen Description   Final    BLOOD BLOOD LEFT FOREARM Performed at [facility] [address], [place], [place] [address]    Special Requests   Final    BOTTLES DRAWN AEROBIC AND ANAEROBIC Blood Culture adequate volume Performed at [facility] [address], [place], [place] [address]    Culture   Final    NO GROWTH 1 DAY Performed at [facility], [place] [address], [place], [place] [address]    Report Status PENDING  Incomplete  Respiratory Panel by RT PCR (Flu A&B, Covid) - Nasopharyngeal Swab     Status: None   Collection Time: 02/07/20 10:20 PM   Specimen: Nasopharyngeal Swab  Result Value Ref Range Status   SARS Coronavirus 2 by RT PCR NEGATIVE NEGATIVE Final    Comment: (NOTE) SARS-CoV-2 target nucleic acids are NOT DETECTED. The SARS-CoV-2 RNA is generally detectable in upper respiratoy specimens during the acute phase of infection. The lowest concentration of SARS-CoV-2 viral  copies this assay can detect is 131 copies/mL. A negative result does not preclude SARS-Cov-2 infection and should not be used as the sole basis for treatment or other patient management decisions. A negative result may occur with  improper specimen collection/handling, submission of specimen other than nasopharyngeal swab, presence of viral mutation(s) within the areas targeted by this assay, and inadequate number of viral copies (<131  copies/mL). A negative result must be combined with clinical observations, patient history, and epidemiological information. The expected result is Negative. Fact Sheet for Patients:  PinkCheek.be Fact Sheet for Healthcare Providers:  GravelBags.it This test is not yet ap proved or cleared by the [place] FDA and  has been authorized for detection and/or diagnosis of SARS-CoV-2 by FDA under an Emergency Use Authorization (EUA). This EUA will remain  in effect (meaning this test can be used) for the duration of the COVID-19 declaration under Section 564(b)(1) of the Act, 21 U.S.C. section 360bbb-3(b)(1), unless the authorization is terminated or revoked sooner.    Influenza A by PCR NEGATIVE NEGATIVE Final   Influenza B by PCR NEGATIVE NEGATIVE Final    Comment: (NOTE) The Xpert Xpress SARS-CoV-2/FLU/RSV assay is intended as an aid in  the diagnosis of influenza from Nasopharyngeal swab specimens and  should not be used as a sole basis for treatment. Nasal washings and  aspirates are unacceptable for Xpert Xpress SARS-CoV-2/FLU/RSV  testing. Fact Sheet for Patients: PinkCheek.be Fact Sheet for Healthcare Providers: GravelBags.it This test is not yet approved or cleared by the [place] FDA and  has been authorized for detection and/or diagnosis of SARS-CoV-2 by  FDA under an Emergency Use Authorization (EUA). This EUA will remain  in effect (meaning this test can be used) for the duration of the  Covid-19 declaration under Section 564(b)(1) of the Act, 21  U.S.C. section 360bbb-3(b)(1), unless the authorization is  terminated or revoked. Performed at [facility], [place] [address], [place], [place] [address]   Urine culture     Status: None   Collection Time: 02/08/20  2:00 AM   Specimen: In/Out Cath Urine  Result Value Ref Range Status    Specimen Description   Final    IN/OUT CATH URINE Performed at [facility] [address] [place], [place] [address]    Special Requests   Final    NONE Performed at [facility], [place] [address], [place], [place] [address]    Culture   Final    NO GROWTH Performed at [facility], [place] [address] [place], [place] [address]    Report Status 02/09/2020 FINAL  Final    RN Pressure Injury Documentation:     Estimated body mass index is 21.95 kg/m as calculated from the following:   Height as of this encounter: 5\' 10"  (1.778 m).   Weight as of this encounter: 69.4 kg.  Malnutrition Type:      Malnutrition Characteristics:      Nutrition Interventions:     Radiology Studies: DG Chest 1 View  Result Date: 02/07/2020 CLINICAL DATA:  Witnessed fall from wheelchair EXAM: CHEST  1 VIEW COMPARISON:  CT 11/16/2019 FINDINGS: Chronically coarsened interstitial changes, similar to prior. No consolidation, features of edema, pneumothorax, or effusion. Stable cardiomediastinal contours. The osseous structures appear diffusely demineralized which may limit detection of small or nondisplaced fractures. No acute osseous abnormality or suspicious osseous lesion. Degenerative changes are present in the imaged spine and shoulders. Telemetry leads overlie the  chest. IMPRESSION: No acute cardiopulmonary or traumatic findings in the chest. Chronically coarsened interstitium. Electronically Signed   By: Lovena Le M.D.   On: 02/07/2020 22:54   CT Head Wo Contrast  Result Date: 02/07/2020 CLINICAL DATA:  Altered mental status, fall from wheelchair EXAM: CT HEAD WITHOUT CONTRAST CT CERVICAL SPINE WITHOUT CONTRAST TECHNIQUE: Multidetector CT imaging of the head and cervical spine was performed following the standard protocol without intravenous contrast. Multiplanar CT image reconstructions of the cervical spine were also generated. COMPARISON:  CT 01/13/2020  FINDINGS: CT HEAD FINDINGS Motion degraded imaging despite several attempts at acquisition Brain: Stable sequela of prior infarcts of the right frontal parietal vertex and occipital lobe as well as scattered remote bilateral lacunar infarcts in the basal ganglia. No evidence of acute infarction, hemorrhage, hydrocephalus, extra-axial collection or mass lesion/mass effect. Symmetric prominence of the ventricles, cisterns and sulci compatible with moderate parenchymal volume loss. Patchy and confluent areas of white matter hypoattenuation are most compatible with chronic microvascular angiopathy. Vascular: Atherosclerotic calcification of the carotid siphons and intradural vertebral arteries. No hyperdense vessel. Skull: Motion degraded imaging. No large hematoma or focal swelling. No calvarial fracture or suspicious osseous lesions. Sinuses/Orbits: Paranasal sinuses and mastoid air cells are predominantly clear. Orbital structures are unremarkable aside from prior lens extractions. Other: None CT CERVICAL SPINE FINDINGS Alignment: Preservation of the normal cervical lordosis without traumatic listhesis. No abnormally widened, perched or jumped facets. Normal alignment of the craniocervical and atlantoaxial articulations accounting for mild leftward cranial rotation of. Skull base and vertebrae: Within the limitations of motion artifact, no visible skull base or vertebral fracture is seen. The dens is intact. Multilevel flowing anterior osteophytosis, compatible with features of diffuse idiopathic skeletal hyperostosis (DISH). Bony fusion across the C4-5 articular facets is noted as well. No worrisome osseous lesions. Soft tissues and spinal canal: No pre or paravertebral fluid or swelling. No visible canal hematoma. Pannus formation about the dens is stable from priors. Disc levels: Disc osteophyte complex at C2-3 with posterior ligamentum flavum hypertrophy resulting in at most mild stenosis at this level. No other  significant canal stenosis. Some uncinate spurring and more pronounced facet hypertrophic changes result in mild right foraminal narrowing C2-3, bilateral foraminal narrowing at C5-6. Upper chest: No acute abnormality in the upper chest or imaged lung apices. Biapical pleuroparenchymal scarring is similar to priors. Postsurgical changes of the right neck likely from prior endarterectomy. Extensive calcification at the left carotid bifurcation. Other: Extensive calcifications about the tonsils may reflect calcified tonsilloliths. Diminutive but otherwise normal thyroid. IMPRESSION: CT head: 1. Motion degraded imaging despite several attempts at acquisition. 2. No evidence of acute intracranial abnormality. 3. Stable sequela of prior infarcts of the right frontal parietal vertex and occipital lobe as well as scattered remote bilateral basal ganglia lacunar infarcts. 4. Moderate parenchymal volume loss and chronic microvascular angiopathy. CT cervical spine: 1. No evidence of acute fracture or traumatic listhesis of the cervical spine. 2. Multilevel flowing anterior osteophytosis, compatible with features of diffuse idiopathic skeletal hyperostosis (DISH). 3. Cervical spondylitic changes, detailed above. 4. Cervical and intracranial atherosclerosis. 5. Postsurgical changes of the right neck likely from prior endarterectomy. Electronically Signed   By: Lovena Le M.D.   On: 02/07/2020 23:05   CT Cervical Spine Wo Contrast  Result Date: 02/07/2020 CLINICAL DATA:  Altered mental status, fall from wheelchair EXAM: CT HEAD WITHOUT CONTRAST CT CERVICAL SPINE WITHOUT CONTRAST TECHNIQUE: Multidetector CT imaging of the head and cervical spine was performed following the  standard protocol without intravenous contrast. Multiplanar CT image reconstructions of the cervical spine were also generated. COMPARISON:  CT 01/13/2020 FINDINGS: CT HEAD FINDINGS Motion degraded imaging despite several attempts at acquisition Brain:  Stable sequela of prior infarcts of the right frontal parietal vertex and occipital lobe as well as scattered remote bilateral lacunar infarcts in the basal ganglia. No evidence of acute infarction, hemorrhage, hydrocephalus, extra-axial collection or mass lesion/mass effect. Symmetric prominence of the ventricles, cisterns and sulci compatible with moderate parenchymal volume loss. Patchy and confluent areas of white matter hypoattenuation are most compatible with chronic microvascular angiopathy. Vascular: Atherosclerotic calcification of the carotid siphons and intradural vertebral arteries. No hyperdense vessel. Skull: Motion degraded imaging. No large hematoma or focal swelling. No calvarial fracture or suspicious osseous lesions. Sinuses/Orbits: Paranasal sinuses and mastoid air cells are predominantly clear. Orbital structures are unremarkable aside from prior lens extractions. Other: None CT CERVICAL SPINE FINDINGS Alignment: Preservation of the normal cervical lordosis without traumatic listhesis. No abnormally widened, perched or jumped facets. Normal alignment of the craniocervical and atlantoaxial articulations accounting for mild leftward cranial rotation of. Skull base and vertebrae: Within the limitations of motion artifact, no visible skull base or vertebral fracture is seen. The dens is intact. Multilevel flowing anterior osteophytosis, compatible with features of diffuse idiopathic skeletal hyperostosis (DISH). Bony fusion across the C4-5 articular facets is noted as well. No worrisome osseous lesions. Soft tissues and spinal canal: No pre or paravertebral fluid or swelling. No visible canal hematoma. Pannus formation about the dens is stable from priors. Disc levels: Disc osteophyte complex at C2-3 with posterior ligamentum flavum hypertrophy resulting in at most mild stenosis at this level. No other significant canal stenosis. Some uncinate spurring and more pronounced facet hypertrophic changes  result in mild right foraminal narrowing C2-3, bilateral foraminal narrowing at C5-6. Upper chest: No acute abnormality in the upper chest or imaged lung apices. Biapical pleuroparenchymal scarring is similar to priors. Postsurgical changes of the right neck likely from prior endarterectomy. Extensive calcification at the left carotid bifurcation. Other: Extensive calcifications about the tonsils may reflect calcified tonsilloliths. Diminutive but otherwise normal thyroid. IMPRESSION: CT head: 1. Motion degraded imaging despite several attempts at acquisition. 2. No evidence of acute intracranial abnormality. 3. Stable sequela of prior infarcts of the right frontal parietal vertex and occipital lobe as well as scattered remote bilateral basal ganglia lacunar infarcts. 4. Moderate parenchymal volume loss and chronic microvascular angiopathy. CT cervical spine: 1. No evidence of acute fracture or traumatic listhesis of the cervical spine. 2. Multilevel flowing anterior osteophytosis, compatible with features of diffuse idiopathic skeletal hyperostosis (DISH). 3. Cervical spondylitic changes, detailed above. 4. Cervical and intracranial atherosclerosis. 5. Postsurgical changes of the right neck likely from prior endarterectomy. Electronically Signed   By: Lovena Le M.D.   On: 02/07/2020 23:05   Scheduled Meds: . Chlorhexidine Gluconate Cloth  6 each Topical Daily  . enoxaparin (LOVENOX) injection  40 mg Subcutaneous Q24H  . erythromycin   Both Eyes Q6H  . levothyroxine  25 mcg Intravenous Daily   Continuous Infusions: . dextrose 5 % and 0.45 % NaCl with KCl 10 mEq/L 75 mL/hr at 02/08/20 2213    LOS: 1 day   Kerney Elbe, DO [facility] P[facility] is on AMION  If 7PM-7AM, please contact night-coverage [email or web address]

## 2020-02-09 NOTE — Progress Notes (Signed)
Notified Lab that ABG being sent for analysis. 

## 2020-02-09 NOTE — TOC Initial Note (Signed)
Transition of Care [facility]) - Initial/Assessment Note    Patient Details  Name: Christian Bennett MRN: [id number] Date of Birth: Jan 29, 1928  [facility]) CM/SW Contact:    Ross Ludwig, LCSW Phone Number: 02/09/2020, 6:08 PM  Clinical Narrative:                  Patient is an 86 year old male who is alert and oriented x1.  Patient has dementia, and resides in [place] on [address].  Patient assessment was completed by speaking to patient's daughter Judson Roch.  CSW spoke to patient's daughter, she stated patient has been in [facility] since January, she has been pleased with the care he is receiving.  Patient's daughter stated that patient was ambulatory about two weeks ago, and was transferring himself in and out of the bed and feeding himself.  Patient has slowly been deteriorating over the last two weeks.  Patient was receiving some home health PT at ALF, however daughter could not remember the name of the agency.  CSW discussed with patient's daughter, that PT will be ordered to see what patient is able to do and how much assistance he needs currently.  CSW also informed her that usually when a patient is from an ALF, the CSW will talk with ALF to coordinate having patient return, or sometimes the ALF will have to assess patient to determine if she can come back at his current level of needs.  CSW informed daughter to be thinking that there is a possibility that patient may need to go to SNF for short term rehab before returning back to [facility].  CSW to monitor patient, and see what PT recommendations are.  CSW to continue to follow patient's progress throughout discharge planning.  Expected Discharge Plan: Memory Care Barriers to Discharge: Continued Medical Work up   Patient Goals and CMS Choice Patient states their goals for this hospitalization and ongoing recovery are:: Patient's daughter would like him to return to [facility]F on [address], if he  can.  If he needs to go to SNF for rehab, she is open to the idea as well. CMS Medicare.gov Compare Post Acute Care list provided to:: Patient Represenative (must comment) Choice offered to / list presented to : Adult Children  Expected Discharge Plan and Services Expected Discharge Plan: Memory Care In-house Referral: Clinical Social Work   Post Acute Care Choice: ([facility] on [address]) Living arrangements for the past 2 months: [place]                                      Prior Living Arrangements/Services Living arrangements for the past 2 months: [place] Lives with:: Facility Resident Patient language and need for interpreter reviewed:: Yes Do you feel safe going back to the place where you live?: Yes      Need for Family Participation in Patient Care: Yes (Comment) Care giver support system in place?: Yes (comment) Current home services: Home PT Criminal Activity/Legal Involvement Pertinent to Current Situation/Hospitalization: No - Comment as needed  Activities of Daily Living Home Assistive Devices/Equipment: Grab bars around toilet, Grab bars in shower, Walker (specify type) ADL Screening (condition at time of admission) Patient's cognitive ability adequate to safely complete daily activities?: No Is the patient deaf or have difficulty hearing?: Yes Does the patient have difficulty seeing, even when  wearing glasses/contacts?: No Does the patient have difficulty concentrating, remembering, or making decisions?: Yes Patient able to express need for assistance with ADLs?: Yes Does the patient have difficulty dressing or bathing?: Yes Independently performs ADLs?: No Communication: Needs assistance Is this a change from baseline?: Change from baseline, expected to last >3 days Dressing (OT): Needs assistance Is this a change from baseline?: Change from baseline, expected to last >3 days Grooming: Needs assistance Is  this a change from baseline?: Change from baseline, expected to last >3 days Feeding: Needs assistance Is this a change from baseline?: Change from baseline, expected to last >3 days Bathing: Needs assistance Is this a change from baseline?: Change from baseline, expected to last >3 days Toileting: Needs assistance Is this a change from baseline?: Change from baseline, expected to last >3days In/Out Bed: Needs assistance Is this a change from baseline?: Change from baseline, expected to last >3 days Walks in Home: Needs assistance Is this a change from baseline?: Change from baseline, expected to last >3 days Does the patient have difficulty walking or climbing stairs?: Yes Weakness of Legs: Both Weakness of Arms/Hands: Both  Permission Sought/Granted Permission sought to share information with : Facility Sport and exercise psychologist, Family Supports Permission granted to share information with : Yes, Release of Information Signed  Share Information with NAME: Cecil,Sarah Daughter   [phone] or Collins,Rebecca Daughter   [phone]  Permission granted to share info w AGENCY: ALF and SNF admissions        Emotional Assessment Appearance:: Appears stated age   Affect (typically observed): Accepting, Calm, Appropriate, Stable Orientation: : Oriented to Self Alcohol / Substance Use: Not Applicable Psych Involvement: No (comment)  Admission diagnosis:  Hypothermia, initial encounter [T68.XXXA] Minor head injury, initial encounter [S09.90XA] Sepsis ([place]) [A41.9] Altered mental status, unspecified altered mental status type [R41.82] Patient Active Problem List   Diagnosis Date Noted  . Vascular dementia ([place]) 02/09/2020  . Encephalopathy 02/09/2020  . Sepsis ([place]) 02/08/2020  . Esophagitis 11/16/2019  . AKI (acute kidney injury) ([place]) 11/16/2019  . CKD (chronic kidney disease), stage III 11/13/2019  . Upper GI bleed 11/12/2019  . GI bleeding 11/11/2019  . Cognitive dysfunction  10/25/2019  . History of CVA (cerebrovascular accident) 10/25/2019  . Anemia 10/25/2019  . Hyperlipidemia 09/26/2019  . Essential hypertension 09/26/2019  . TIA due to embolism ([place]) 09/26/2019  . H/O carotid endarterectomy 09/26/2019  . Carotid artery stenosis, symptomatic, right 07/25/2019  . CVA (cerebral vascular accident) ([place]) 07/11/2019  . TIA (transient ischemic attack) 07/10/2019  . Adjustment disorder with mixed anxiety and depressed mood 06/20/2018  . Vitamin D insufficiency 06/20/2018  . History of anemia 06/20/2018  . Medically noncompliant-   does not want to go for hearing eval 06/20/2018  . Bilateral impacted cerumen 02/07/2018  . Hearing difficulty of both ears 02/03/2018  . Environmental and seasonal allergies 02/03/2018  . Eustachian tube dysfunction, bilateral 02/03/2018  . History of vitamin D deficiency 01/30/2018  . Contact dermatitis and eczema 10/19/2017  . HTN (hypertension) 06/22/2017  . GERD (gastroesophageal reflux disease) 06/22/2017  . Hx of colonoscopy 06/22/2017  . Mood disorder ([place]) 06/22/2017  . Hypothyroidism 06/22/2017  . Chronic pancreatitis ([place]) 06/22/2017   PCP:  Roetta Sessions, NP Pharmacy:   [facility] [place] - [id number] [place] ([place]) [place] [address] Phone: [phone] Fax: [phone]     Social Determinants of Health (SDOH) Interventions    Readmission Risk Interventions Readmission  Risk Prevention Plan 11/18/2019  Transportation Screening Complete  PCP or Specialist Appt within 3-5 Days Complete  HRI or Home Care Consult Complete  Social Work Consult for [place] Planning/Counseling Complete  Palliative Care Screening (No Data)  Medication Review Press photographer) Complete  Some recent data might be hidden

## 2020-02-10 DIAGNOSIS — Z515 Encounter for palliative care: Secondary | ICD-10-CM

## 2020-02-10 DIAGNOSIS — F0151 Vascular dementia with behavioral disturbance: Secondary | ICD-10-CM

## 2020-02-10 DIAGNOSIS — Z7189 Other specified counseling: Secondary | ICD-10-CM

## 2020-02-10 DIAGNOSIS — R4182 Altered mental status, unspecified: Secondary | ICD-10-CM

## 2020-02-10 LAB — COMPREHENSIVE METABOLIC PANEL
ALT: 17 U/L (ref 0–44)
AST: 19 U/L (ref 15–41)
Albumin: 3.2 g/dL — ABNORMAL LOW (ref 3.5–5.0)
Alkaline Phosphatase: 72 U/L (ref 38–126)
Anion gap: 9 (ref 5–15)
BUN: 13 mg/dL (ref 8–23)
CO2: 26 mmol/L (ref 22–32)
Calcium: 9.4 mg/dL (ref 8.9–10.3)
Chloride: 100 mmol/L (ref 98–111)
Creatinine, Ser: 1.16 mg/dL (ref 0.61–1.24)
GFR calc Af Amer: >60 mL/min (ref >60)
GFR calc non Af Amer: 56 mL/min — ABNORMAL LOW (ref >60)
Glucose, Bld: 89 mg/dL (ref 70–99)
Potassium: 4 mmol/L (ref 3.5–5.1)
Sodium: 135 mmol/L (ref 135–145)
Total Bilirubin: 0.3 mg/dL (ref 0.3–1.2)
Total Protein: 6.6 g/dL (ref 6.5–8.1)

## 2020-02-10 LAB — PROCALCITONIN: Procalcitonin: <0.1 ng/mL

## 2020-02-10 LAB — CBC WITH DIFFERENTIAL/PLATELET
Basophils Absolute: 0 10*3/uL (ref 0.0–0.1)
Basophils Relative: 1 %
Eosinophils Absolute: 0.1 10*3/uL (ref 0.0–0.5)
Eosinophils Relative: 1 %
HCT: 28.7 % — ABNORMAL LOW (ref 39.0–52.0)
Hemoglobin: 9 g/dL — ABNORMAL LOW (ref 13.0–17.0)
Lymphocytes Relative: 11 %
Lymphs Abs: 0.9 10*3/uL (ref 0.7–4.0)
MCH: 26.6 pg (ref 26.0–34.0)
MCHC: 31.4 g/dL (ref 30.0–36.0)
MCV: 84.9 fL (ref 80.0–100.0)
Monocytes Absolute: 1.2 10*3/uL — ABNORMAL HIGH (ref 0.1–1.0)
Monocytes Relative: 14 %
Neutro Abs: 6.1 10*3/uL (ref 1.7–7.7)
Neutrophils Relative %: 73 %
Platelets: 122 10*3/uL — ABNORMAL LOW (ref 150–400)
RBC: 3.38 MIL/uL — ABNORMAL LOW (ref 4.22–5.81)
RDW: 19.5 % — ABNORMAL HIGH (ref 11.5–15.5)
WBC: 8.4 10*3/uL (ref 4.0–10.5)
nRBC: 0 % (ref 0.0–0.2)

## 2020-02-10 LAB — MAGNESIUM: Magnesium: 2.1 mg/dL (ref 1.7–2.4)

## 2020-02-10 LAB — PHOSPHORUS: Phosphorus: 3.2 mg/dL (ref 2.5–4.6)

## 2020-02-10 MED ORDER — ONDANSETRON 4 MG PO TBDP: 4.0000 mg | ORAL_TABLET | Freq: Four times a day (QID) | ORAL | 0 refills | Status: AC | PRN

## 2020-02-10 MED ORDER — ONDANSETRON 4 MG PO TBDP: 4.0000 mg | ORAL_TABLET | Freq: Four times a day (QID) | ORAL | Status: DC | PRN

## 2020-02-10 MED ORDER — GLYCOPYRROLATE 1 MG PO TABS
1.0000 mg | ORAL_TABLET | ORAL | Status: DC | PRN
  Filled 2020-02-10: qty 1

## 2020-02-10 MED ORDER — POLYVINYL ALCOHOL 1.4 % OP SOLN: 1.0000 [drp] | Freq: Four times a day (QID) | OPHTHALMIC | 0 refills | Status: AC | PRN

## 2020-02-10 MED ORDER — LORAZEPAM 2 MG/ML IJ SOLN: 1.0000 mg | INTRAMUSCULAR | Status: DC | PRN

## 2020-02-10 MED ORDER — MORPHINE SULFATE (PF) 2 MG/ML IV SOLN: 1.0000 mg | INTRAVENOUS | Status: DC | PRN

## 2020-02-10 MED ORDER — LORAZEPAM 2 MG/ML PO CONC
1.0000 mg | ORAL | Status: DC | PRN
  Filled 2020-02-10: qty 0.5

## 2020-02-10 MED ORDER — LORAZEPAM 2 MG/ML PO CONC: 1.0000 mg | ORAL | 0 refills | Status: AC | PRN

## 2020-02-10 MED ORDER — HALOPERIDOL LACTATE 2 MG/ML PO CONC
0.5000 mg | ORAL | Status: DC | PRN
  Filled 2020-02-10: qty 0.3

## 2020-02-10 MED ORDER — HALOPERIDOL LACTATE 5 MG/ML IJ SOLN: 0.5000 mg | INTRAMUSCULAR | Status: DC | PRN

## 2020-02-10 MED ORDER — ACETAMINOPHEN 650 MG RE SUPP: 650.0000 mg | Freq: Four times a day (QID) | RECTAL | Status: DC | PRN

## 2020-02-10 MED ORDER — ACETAMINOPHEN 325 MG PO TABS: 650.0000 mg | ORAL_TABLET | Freq: Four times a day (QID) | ORAL | Status: DC | PRN

## 2020-02-10 MED ORDER — HALOPERIDOL 0.5 MG PO TABS
0.5000 mg | ORAL_TABLET | ORAL | Status: DC | PRN
  Filled 2020-02-10: qty 1

## 2020-02-10 MED ORDER — LORAZEPAM 1 MG PO TABS: 1.0000 mg | ORAL_TABLET | ORAL | Status: DC | PRN

## 2020-02-10 MED ORDER — GLYCOPYRROLATE 0.2 MG/ML IJ SOLN
0.2000 mg | INTRAMUSCULAR | Status: DC | PRN
  Filled 2020-02-10: qty 1

## 2020-02-10 MED ORDER — HALOPERIDOL LACTATE 2 MG/ML PO CONC: 0.5000 mg | ORAL | 0 refills | Status: AC | PRN

## 2020-02-10 MED ORDER — ONDANSETRON HCL 4 MG/2ML IJ SOLN: 4.0000 mg | Freq: Four times a day (QID) | INTRAMUSCULAR | Status: DC | PRN

## 2020-02-10 MED ORDER — ACETAMINOPHEN 325 MG PO TABS: 650.0000 mg | ORAL_TABLET | Freq: Four times a day (QID) | ORAL | Status: AC | PRN

## 2020-02-10 MED ORDER — POLYVINYL ALCOHOL 1.4 % OP SOLN
1.0000 [drp] | Freq: Four times a day (QID) | OPHTHALMIC | Status: DC | PRN
  Filled 2020-02-10: qty 15

## 2020-02-10 MED ORDER — GLYCOPYRROLATE 0.2 MG/ML IJ SOLN: 0.2000 mg | INTRAMUSCULAR | Status: AC | PRN

## 2020-02-10 MED ORDER — BIOTENE DRY MOUTH MT LIQD: 15.0000 mL | OROMUCOSAL | Status: AC | PRN

## 2020-02-10 MED ORDER — BIOTENE DRY MOUTH MT LIQD: 15.0000 mL | OROMUCOSAL | Status: DC | PRN

## 2020-02-10 NOTE — Discharge Summary (Signed)
Physician Discharge Summary  Christian Bennett XBJ:[id number] DOB: 04-26-1928 DOA: 02/07/2020  PCP: Roetta Sessions, NP  Admit date: 02/07/2020 Discharge date: 02/10/2020  Admitted From: [facility] Care [facility]F Disposition: Residential Hospice  Recommendations for Outpatient Follow-up:  1. Further Care per Hospice Protocol   Home Health: No  Equipment/Devices: None   Discharge Condition: Guarded CODE STATUS: DO NOT RESUSCITATE Diet recommendation: NPO  Brief/Interim Summary: The patient is a 86 year old elderly Caucasian male from a Memory care ALF with a past medical history significant for but not limited to dementia, hypothyroidism, hypertension, coronary artery stenosis, GERD, recurrent falls, history, history of CVA, mood disorder as well as other comorbidities who presents to the ED after a fall at his SNF. Patient had a witnessed fall out of his wheelchair and hit his head reportedly he is typically alert,ambulatory, and conversive and interactive with staff. Reportedly he became more agitated recently had been given Ativan for treatment of his agitation. Currently he is unable to provide a subjective history due to his status. He underwent imaging studies while he was in the ED and had a head CT and a C-spine. There is no acute abnormality but there is a stable infarcts involving the right frontal parietal and occipital lobes as well as scattered bilateral basal ganglia lacunar infarcts. Hypothermic in the ED as well with a temperature of 90.  He continued to be encephalopathic and unresponsive and does not really wake up much and has continued to decline further.  Further work-up was nitiated with an ABG, EEG, as well as a MRI of the brain however EEG and MRI not done after Palliative Care Discussion with the Family. The family has elected Comfort Measures given his lack of improvement and understand that he is dying. The family requested transfer to Residential Hospice at [facility]  and he has a bed today and will be Discharged.   Discharge Diagnoses:  Active Problems:   HTN (hypertension)   Hypothyroidism   CVA (cerebral vascular accident) ([place])   Hyperlipidemia   H/O carotid endarterectomy   Cognitive dysfunction   Sepsis ([place])   Vascular dementia ([place])   Encephalopathy  Initially felt to have sepsiswith unknown source however do not acutely believe he is infected; now more likely SIRS - patient presents with AMS, bradycardic, neutropenic, hypotensive, and hypothermic - unclear source if any at this time, but treated with empiric abx and follow up cultures; we will stop all antibiotics now and observe - UA surprisingly negative after cath specimen; initially thought to be the source - Initially continued vanc/cefepime/flagyl but will D/C now  -Continue Bair huggergiven his Hypothermia if needed -Avoid further sedating agents. Do not see chronic benzos on the database;unclear how long he has been receiving at the SNF -C/w Fluid Hydration with D5 1/2 NS at 75 mL/hr for now but will stop if patient gets more volume overloaded and starts to swell  -Procalcitonin level was less than 0.10x2 and we will likely de-escalate antibiotics further given that he has unremarkable urinalysis, negative and showed no growth and because his blood cultures are no growth to date, he is afebrile now and leukopenia has resolved -Chest x-ray showed "No acute cardiopulmonary or traumatic findings in the chest. Chronically coarsened interstitium." -Palliative met with patient's daughter and after a lengthy discussion of the decision was made to make the patient comfort care and he will likely be transferred to [facility] once bed is available -Comfort measures have been enacted and patient is acetaminophen, dextrose, glycopyrrolate,  haloperidol, lorazepam, ondansetron, morphine pushes as well as Liquifilm Tears for his comfort.  AcuteEncephalopathy superimposed on vascular dementia  with behavioral disturbances, not improving - presumed toxic metabolic at this time but his electrolytes are now improved; unclear if this is progression of his dementia - continue abxas above, IVF, and sepsis workup as above - if still doesn't clear may need to consider non-convulsive seizures given encephalomalacia from previous stroke and recurrent falls -EEG has been ordered and will also order an MRI of the brain as well as a ABG to rule out hypercarbia or somnolence however this is been canceled after lengthy discussion with palliative care and the family the patient's family has decided to make him comfort care - PatientStill Encephalopathic so will pursue further testing - Check RPR and was nonreactive, B12 was 783, and Ammonia Level 21 - Place on Delrium Precautions -Will Get SLP but is not awake enough so we will obtain an ABG given his somnolence -Follow-up on ABG, EEG, MRI; EEG and MRI have not been canceled -ABG done and showed pH of 7.431, PCO2 of 44.2, PO2 of 70.1, bicarbonate level 20.9, and ABG O2 saturation of 92.6% on 21% FiO2 -Palliative care consulted for further evaluation recommendations and to have a GOC Discussion and after a lengthy discussion the goals of care have changed and the patient is to be comfort care given his worsening and decompensation.  Palliative discussed with the family and the patient's daughter and her sisters have discussed that the patient is dying and they want to ensure that he has a comfortable in dignified death is possible they are acceptable to comfort focused approach and he will be transition to residential hospice   Hypothyroidism -CheckedTSH and was 6.965 and free T4 was 0.70 and T3 was extremely low -Takes NP Thyroid 25 mg po Daily  -We will initiate thyroid replacement for now given IV Synthroid micrograms   Hypertension -Hold home meds in setting of hypotension(BPwas 98/55) because he is not awake enough -We will not check blood  pressures carefully now that he is comfort care  History of a right-sided brain CVA  History of carotid stenosis status post right CEA -Back in March his aspirin was recently discontinued due to concerns of GI bleed Continue with atorvastatin -Follows up with neurology in outpatient setting and with vascular surgery Dr. Donnetta Hutching however patient will be comfort care now  Pancytopenia -Mild -Patient's WBC was 3.2, hemoglobin/hematocrit dropped to 7.5/24.5 and platelet count was 121,000 on admission -Now patient's WBC is 4.4, hemoglobin/hematocrit 7.9/25.4, and platelet count is slowly improving and is now 125,000 -Checked anemia panel and showed an iron level of 72, U IBC of 256, TIBC 328, saturation ratio 22%, ferritin level 31, folate level 11.5 and vitamin B12 783 -Continue to monitor for signs and symptoms of bleeding; currently no overt bleeding or -Likely this is in the setting of his sepsis -Will not repeat CBC with differential and and given that he is comfort care  Hypoglycemia -Mild with a Blood Sugar of 60 today but this is now improved as blood sugar was 89 -Changed IVF to have D5W -Continue to Monitor Blood Sugars Carefully and will continue fluid as above but will stop if he becomes volume overloaded  Hypomagnesemia -Patient magnesium level of 1.5 and is now improved to 2.1 -Replete with IV mag sulfate 3 g times yesterday -Continue to monitor and replete as necessary -Repeat magnesium level in a.m.  GOC: DNR,poA -Family has elected for Residential Hospice  Discharge Instructions  Discharge Instructions    Call MD for:  difficulty breathing, headache or visual disturbances   Complete by: As directed    Call MD for:  extreme fatigue   Complete by: As directed    Call MD for:  hives   Complete by: As directed    Call MD for:  persistant dizziness or light-headedness   Complete by: As directed    Call MD for:  persistant nausea and vomiting   Complete by: As  directed    Call MD for:  redness, tenderness, or signs of infection (pain, swelling, redness, odor or green/yellow discharge around incision site)   Complete by: As directed    Call MD for:  severe uncontrolled pain   Complete by: As directed    Call MD for:  temperature >100.4   Complete by: As directed    Discharge instructions   Complete by: As directed    Further Care per Hospice Protocol   Increase activity slowly   Complete by: As directed      Allergies as of 02/10/2020      Reactions   Doxycycline Hives   Eggs Or Egg-derived Products    UNSPECIFIED REACTION  >> "Sick"      Medication List    STOP taking these medications   amLODipine 5 MG tablet Commonly known as: NORVASC   atorvastatin 40 MG tablet Commonly known as: LIPITOR   ciprofloxacin 250 MG tablet Commonly known as: CIPRO   Creon 36000 UNITS Cpep capsule Generic drug: lipase/protease/amylase   divalproex 125 MG DR tablet Commonly known as: Depakote   eucerin cream   fluticasone 50 MCG/ACT nasal spray Commonly known as: FLONASE   iron polysaccharides 150 MG capsule Commonly known as: NIFEREX   LORazepam 0.5 MG tablet Commonly known as: ATIVAN Replaced by: LORazepam 2 MG/ML concentrated solution   magnesium oxide 400 (241.3 Mg) MG tablet Commonly known as: MAG-OX   melatonin 5 MG Tabs   omeprazole-sodium bicarbonate 40-1100 MG capsule Commonly known as: Zegerid   ondansetron 4 MG tablet Commonly known as: ZOFRAN   QUEtiapine 25 MG tablet Commonly known as: SEROQUEL   risperiDONE 0.25 MG tablet Commonly known as: RISPERDAL   senna 8.6 MG Tabs tablet Commonly known as: SENOKOT   sertraline 25 MG tablet Commonly known as: ZOLOFT   thyroid 60 MG tablet Commonly known as: NP Thyroid   triamcinolone cream 0.1 % Commonly known as: KENALOG   Vitamin D (Ergocalciferol) 1.25 MG (50000 UNIT) Caps capsule Commonly known as: DRISDOL     TAKE these medications   acetaminophen 325 MG  tablet Commonly known as: TYLENOL Take 2 tablets (650 mg total) by mouth every 6 (six) hours as needed for mild pain (or Fever >/= 101).   antiseptic oral rinse Liqd Apply 15 mLs topically as needed for dry mouth.   glycopyrrolate 0.2 MG/ML injection Commonly known as: ROBINUL Inject 1 mL (0.2 mg total) into the skin every 4 (four) hours as needed (excessive secretions).   haloperidol 2 MG/ML solution Commonly known as: HALDOL Place 0.3 mLs (0.6 mg total) under the tongue every 4 (four) hours as needed for agitation (or delirium).   LORazepam 2 MG/ML concentrated solution Commonly known as: ATIVAN Place 0.5 mLs (1 mg total) under the tongue every 4 (four) hours as needed for anxiety. Replaces: LORazepam 0.5 MG tablet   ondansetron 4 MG disintegrating tablet Commonly known as: ZOFRAN-ODT Take 1 tablet (4 mg total) by mouth every 6 (  six) hours as needed for nausea.   polyvinyl alcohol 1.4 % ophthalmic solution Commonly known as: LIQUIFILM TEARS Place 1 drop into both eyes 4 (four) times daily as needed for dry eyes.       Allergies  Allergen Reactions  . Doxycycline Hives  . Eggs Or Egg-Derived Products     UNSPECIFIED REACTION  >> "Sick"   Consultations:  Palliative Care Medicine  Procedures/Studies: DG Chest 1 View  Result Date: 02/07/2020 CLINICAL DATA:  Witnessed fall from wheelchair EXAM: CHEST  1 VIEW COMPARISON:  CT 11/16/2019 FINDINGS: Chronically coarsened interstitial changes, similar to prior. No consolidation, features of edema, pneumothorax, or effusion. Stable cardiomediastinal contours. The osseous structures appear diffusely demineralized which may limit detection of small or nondisplaced fractures. No acute osseous abnormality or suspicious osseous lesion. Degenerative changes are present in the imaged spine and shoulders. Telemetry leads overlie the chest. IMPRESSION: No acute cardiopulmonary or traumatic findings in the chest. Chronically coarsened  interstitium. Electronically Signed   By: Lovena Le M.D.   On: 02/07/2020 22:54   CT Head Wo Contrast  Result Date: 02/07/2020 CLINICAL DATA:  Altered mental status, fall from wheelchair EXAM: CT HEAD WITHOUT CONTRAST CT CERVICAL SPINE WITHOUT CONTRAST TECHNIQUE: Multidetector CT imaging of the head and cervical spine was performed following the standard protocol without intravenous contrast. Multiplanar CT image reconstructions of the cervical spine were also generated. COMPARISON:  CT 01/13/2020 FINDINGS: CT HEAD FINDINGS Motion degraded imaging despite several attempts at acquisition Brain: Stable sequela of prior infarcts of the right frontal parietal vertex and occipital lobe as well as scattered remote bilateral lacunar infarcts in the basal ganglia. No evidence of acute infarction, hemorrhage, hydrocephalus, extra-axial collection or mass lesion/mass effect. Symmetric prominence of the ventricles, cisterns and sulci compatible with moderate parenchymal volume loss. Patchy and confluent areas of white matter hypoattenuation are most compatible with chronic microvascular angiopathy. Vascular: Atherosclerotic calcification of the carotid siphons and intradural vertebral arteries. No hyperdense vessel. Skull: Motion degraded imaging. No large hematoma or focal swelling. No calvarial fracture or suspicious osseous lesions. Sinuses/Orbits: Paranasal sinuses and mastoid air cells are predominantly clear. Orbital structures are unremarkable aside from prior lens extractions. Other: None CT CERVICAL SPINE FINDINGS Alignment: Preservation of the normal cervical lordosis without traumatic listhesis. No abnormally widened, perched or jumped facets. Normal alignment of the craniocervical and atlantoaxial articulations accounting for mild leftward cranial rotation of. Skull base and vertebrae: Within the limitations of motion artifact, no visible skull base or vertebral fracture is seen. The dens is intact.  Multilevel flowing anterior osteophytosis, compatible with features of diffuse idiopathic skeletal hyperostosis (DISH). Bony fusion across the C4-5 articular facets is noted as well. No worrisome osseous lesions. Soft tissues and spinal canal: No pre or paravertebral fluid or swelling. No visible canal hematoma. Pannus formation about the dens is stable from priors. Disc levels: Disc osteophyte complex at C2-3 with posterior ligamentum flavum hypertrophy resulting in at most mild stenosis at this level. No other significant canal stenosis. Some uncinate spurring and more pronounced facet hypertrophic changes result in mild right foraminal narrowing C2-3, bilateral foraminal narrowing at C5-6. Upper chest: No acute abnormality in the upper chest or imaged lung apices. Biapical pleuroparenchymal scarring is similar to priors. Postsurgical changes of the right neck likely from prior endarterectomy. Extensive calcification at the left carotid bifurcation. Other: Extensive calcifications about the tonsils may reflect calcified tonsilloliths. Diminutive but otherwise normal thyroid. IMPRESSION: CT head: 1. Motion degraded imaging despite several attempts at  acquisition. 2. No evidence of acute intracranial abnormality. 3. Stable sequela of prior infarcts of the right frontal parietal vertex and occipital lobe as well as scattered remote bilateral basal ganglia lacunar infarcts. 4. Moderate parenchymal volume loss and chronic microvascular angiopathy. CT cervical spine: 1. No evidence of acute fracture or traumatic listhesis of the cervical spine. 2. Multilevel flowing anterior osteophytosis, compatible with features of diffuse idiopathic skeletal hyperostosis (DISH). 3. Cervical spondylitic changes, detailed above. 4. Cervical and intracranial atherosclerosis. 5. Postsurgical changes of the right neck likely from prior endarterectomy. Electronically Signed   By: Lovena Le M.D.   On: 02/07/2020 23:05   CT Head Wo  Contrast  Result Date: 01/13/2020 CLINICAL DATA:  86 year old male with trauma. EXAM: CT HEAD WITHOUT CONTRAST CT CERVICAL SPINE WITHOUT CONTRAST TECHNIQUE: Multidetector CT imaging of the head and cervical spine was performed following the standard protocol without intravenous contrast. Multiplanar CT image reconstructions of the cervical spine were also generated. COMPARISON:  Head CT dated 01/07/2020. FINDINGS: CT HEAD FINDINGS Brain: There is moderate age-related atrophy and chronic microvascular ischemic changes. Old left basal ganglia infarct and encephalomalacia. There is no acute intracranial hemorrhage. No mass effect or midline shift. No extra-axial fluid collection. Vascular: No hyperdense vessel or unexpected calcification. Skull: Normal. Negative for fracture or focal lesion. Sinuses/Orbits: No acute finding. Other: None CT CERVICAL SPINE FINDINGS Alignment: No acute subluxation. Skull base and vertebrae: No acute fracture. Osteopenia. Soft tissues and spinal canal: No prevertebral fluid or swelling. No visible canal hematoma. Disc levels: Multilevel degenerative changes with endplate irregularity and disc space narrowing. There is multilevel anterior bridging osteophyte most consistent with diffuse idiopathic skeletal hyperostosis. Upper chest: Biapical subpleural scarring. Other: Left carotid bulb calcified plaques. Postsurgical changes of right carotid endarterectomy. IMPRESSION: 1. No acute intracranial pathology. Moderate age-related atrophy and chronic microvascular ischemic changes. Old left basal ganglia infarct and encephalomalacia. 2. No acute/traumatic cervical spine pathology. Multilevel degenerative changes. Electronically Signed   By: Anner Crete M.D.   On: 01/13/2020 20:36   CT Cervical Spine Wo Contrast  Result Date: 02/07/2020 CLINICAL DATA:  Altered mental status, fall from wheelchair EXAM: CT HEAD WITHOUT CONTRAST CT CERVICAL SPINE WITHOUT CONTRAST TECHNIQUE: Multidetector  CT imaging of the head and cervical spine was performed following the standard protocol without intravenous contrast. Multiplanar CT image reconstructions of the cervical spine were also generated. COMPARISON:  CT 01/13/2020 FINDINGS: CT HEAD FINDINGS Motion degraded imaging despite several attempts at acquisition Brain: Stable sequela of prior infarcts of the right frontal parietal vertex and occipital lobe as well as scattered remote bilateral lacunar infarcts in the basal ganglia. No evidence of acute infarction, hemorrhage, hydrocephalus, extra-axial collection or mass lesion/mass effect. Symmetric prominence of the ventricles, cisterns and sulci compatible with moderate parenchymal volume loss. Patchy and confluent areas of white matter hypoattenuation are most compatible with chronic microvascular angiopathy. Vascular: Atherosclerotic calcification of the carotid siphons and intradural vertebral arteries. No hyperdense vessel. Skull: Motion degraded imaging. No large hematoma or focal swelling. No calvarial fracture or suspicious osseous lesions. Sinuses/Orbits: Paranasal sinuses and mastoid air cells are predominantly clear. Orbital structures are unremarkable aside from prior lens extractions. Other: None CT CERVICAL SPINE FINDINGS Alignment: Preservation of the normal cervical lordosis without traumatic listhesis. No abnormally widened, perched or jumped facets. Normal alignment of the craniocervical and atlantoaxial articulations accounting for mild leftward cranial rotation of. Skull base and vertebrae: Within the limitations of motion artifact, no visible skull base or vertebral fracture is seen.  The dens is intact. Multilevel flowing anterior osteophytosis, compatible with features of diffuse idiopathic skeletal hyperostosis (DISH). Bony fusion across the C4-5 articular facets is noted as well. No worrisome osseous lesions. Soft tissues and spinal canal: No pre or paravertebral fluid or swelling. No  visible canal hematoma. Pannus formation about the dens is stable from priors. Disc levels: Disc osteophyte complex at C2-3 with posterior ligamentum flavum hypertrophy resulting in at most mild stenosis at this level. No other significant canal stenosis. Some uncinate spurring and more pronounced facet hypertrophic changes result in mild right foraminal narrowing C2-3, bilateral foraminal narrowing at C5-6. Upper chest: No acute abnormality in the upper chest or imaged lung apices. Biapical pleuroparenchymal scarring is similar to priors. Postsurgical changes of the right neck likely from prior endarterectomy. Extensive calcification at the left carotid bifurcation. Other: Extensive calcifications about the tonsils may reflect calcified tonsilloliths. Diminutive but otherwise normal thyroid. IMPRESSION: CT head: 1. Motion degraded imaging despite several attempts at acquisition. 2. No evidence of acute intracranial abnormality. 3. Stable sequela of prior infarcts of the right frontal parietal vertex and occipital lobe as well as scattered remote bilateral basal ganglia lacunar infarcts. 4. Moderate parenchymal volume loss and chronic microvascular angiopathy. CT cervical spine: 1. No evidence of acute fracture or traumatic listhesis of the cervical spine. 2. Multilevel flowing anterior osteophytosis, compatible with features of diffuse idiopathic skeletal hyperostosis (DISH). 3. Cervical spondylitic changes, detailed above. 4. Cervical and intracranial atherosclerosis. 5. Postsurgical changes of the right neck likely from prior endarterectomy. Electronically Signed   By: Lovena Le M.D.   On: 02/07/2020 23:05   CT Cervical Spine Wo Contrast  Result Date: 01/13/2020 CLINICAL DATA:  86 year old male with trauma. EXAM: CT HEAD WITHOUT CONTRAST CT CERVICAL SPINE WITHOUT CONTRAST TECHNIQUE: Multidetector CT imaging of the head and cervical spine was performed following the standard protocol without intravenous  contrast. Multiplanar CT image reconstructions of the cervical spine were also generated. COMPARISON:  Head CT dated 01/07/2020. FINDINGS: CT HEAD FINDINGS Brain: There is moderate age-related atrophy and chronic microvascular ischemic changes. Old left basal ganglia infarct and encephalomalacia. There is no acute intracranial hemorrhage. No mass effect or midline shift. No extra-axial fluid collection. Vascular: No hyperdense vessel or unexpected calcification. Skull: Normal. Negative for fracture or focal lesion. Sinuses/Orbits: No acute finding. Other: None CT CERVICAL SPINE FINDINGS Alignment: No acute subluxation. Skull base and vertebrae: No acute fracture. Osteopenia. Soft tissues and spinal canal: No prevertebral fluid or swelling. No visible canal hematoma. Disc levels: Multilevel degenerative changes with endplate irregularity and disc space narrowing. There is multilevel anterior bridging osteophyte most consistent with diffuse idiopathic skeletal hyperostosis. Upper chest: Biapical subpleural scarring. Other: Left carotid bulb calcified plaques. Postsurgical changes of right carotid endarterectomy. IMPRESSION: 1. No acute intracranial pathology. Moderate age-related atrophy and chronic microvascular ischemic changes. Old left basal ganglia infarct and encephalomalacia. 2. No acute/traumatic cervical spine pathology. Multilevel degenerative changes. Electronically Signed   By: Anner Crete M.D.   On: 01/13/2020 20:36      Subjective: Seen and examined at bedside and he does not really respond and is worse than yesterday.  He remains encephalopathic and persistently altered.  I spoke with the daughter at bedside and who felt that patient had worsened from yesterday to today.  After lengthy discussion between palliative care and the patient daughter the plan is to move forward with comfort care and transition to a residential hospice.  Discharge Exam: Vitals:   02/10/20 0447 02/10/20 1404  BP:  (!) 153/67 (!) 158/63  Pulse: 77 67  Resp: 16   Temp: 98.2 F (36.8 C) 98.5 F (36.9 C)  SpO2: 95% 98%   Vitals:   02/09/20 1408 02/09/20 2038 02/10/20 0447 02/10/20 1404  BP: (!) 148/75 (!) 172/83 (!) 153/67 (!) 158/63  Pulse: 76 72 77 67  Resp: '15 18 16   ' Temp: 98.8 F (37.1 C) 97.8 F (36.6 C) 98.2 F (36.8 C) 98.5 F (36.9 C)  TempSrc: Oral Oral Oral Axillary  SpO2: 94% 97% 95% 98%  Weight:      Height:       Examination: Physical Exam:  Constitutional: The patient is a thin elderly chronically ill-appearing Caucasian male currently in no acute distress but he is not awake enough and does not arouse to physical or verbal stimuli. Eyes: Lids and conjunctivae normal, sclerae anicteric  ENMT: External Ears, Nose appear normal.  Hard of hearing Neck: Appears normal, supple, no cervical masses, normal ROM, no appreciable thyromegaly Respiratory: Diminished to auscultation bilaterally with some coarse breath sounds and some mild rhonchi but no appreciable wheezing, rales, crackles. No accessory muscle use.  Unlabored breathing Cardiovascular: RRR, no murmurs / rubs / gallops. S1 and S2 auscultated.  Minimal extremity edema.  Abdomen: Soft, non-tender, nondistended. No masses palpated. No appreciable hepatosplenomegaly. Bowel sounds positive.  GU: Deferred. Musculoskeletal: No clubbing / cyanosis of digits/nails. No joint deformity upper and lower extremities.  Skin: No rashes, lesions, ulcers on limited skin evaluation. No induration; Warm and dry.  Neurologic: Is not wake to follow commands and does not arouse Psychiatric: Impaired judgment and insight.  Not alert and oriented x3.  Somnolent and very difficult to arouse.  The results of significant diagnostics from this hospitalization (including imaging, microbiology, ancillary and laboratory) are listed below for reference.    Microbiology: Recent Results (from the past 240 hour(s))  Culture, blood (routine x 2)      Status: None (Preliminary result)   Collection Time: 02/07/20 10:03 PM   Specimen: BLOOD  Result Value Ref Range Status   Specimen Description   Final    BLOOD RIGHT ANTECUBITAL Performed at [facility] [address] [place], [place] [address]    Special Requests   Final    BOTTLES DRAWN AEROBIC AND ANAEROBIC Blood Culture adequate volume Performed at [facility] [address] [place], [place] [address]    Culture   Final    NO GROWTH 2 DAYS Performed at [facility] [address], [place], [place] [address]    Report Status PENDING  Incomplete  Culture, blood (routine x 2)     Status: None (Preliminary result)   Collection Time: 02/07/20 10:03 PM   Specimen: BLOOD  Result Value Ref Range Status   Specimen Description   Final    BLOOD BLOOD LEFT FOREARM Performed at [facility] [address], [place], [place] [address]    Special Requests   Final    BOTTLES DRAWN AEROBIC AND ANAEROBIC Blood Culture adequate volume Performed at [facility] [address] [place], [place] [address]    Culture   Final    NO GROWTH 2 DAYS Performed at [facility] [address], [place], [place] [address]    Report Status PENDING  Incomplete  Respiratory Panel by RT PCR (Flu A&B, Covid) - Nasopharyngeal Swab     Status: None   Collection Time: 02/07/20 10:20 PM  Specimen: Nasopharyngeal Swab  Result Value Ref Range Status   SARS Coronavirus 2 by RT PCR NEGATIVE NEGATIVE Final    Comment: (NOTE) SARS-CoV-2 target nucleic acids are NOT DETECTED. The SARS-CoV-2 RNA is generally detectable in upper respiratoy specimens during the acute phase of infection. The lowest concentration of SARS-CoV-2 viral copies this assay can detect is 131 copies/mL. A negative result does not preclude SARS-Cov-2 infection and should not be used as the sole basis for treatment or other patient management  decisions. A negative result may occur with  improper specimen collection/handling, submission of specimen other than nasopharyngeal swab, presence of viral mutation(s) within the areas targeted by this assay, and inadequate number of viral copies (<131 copies/mL). A negative result must be combined with clinical observations, patient history, and epidemiological information. The expected result is Negative. Fact Sheet for Patients:  PinkCheek.be Fact Sheet for Healthcare Providers:  GravelBags.it This test is not yet ap proved or cleared by the [place] FDA and  has been authorized for detection and/or diagnosis of SARS-CoV-2 by FDA under an Emergency Use Authorization (EUA). This EUA will remain  in effect (meaning this test can be used) for the duration of the COVID-19 declaration under Section 564(b)(1) of the Act, 21 U.S.C. section 360bbb-3(b)(1), unless the authorization is terminated or revoked sooner.    Influenza A by PCR NEGATIVE NEGATIVE Final   Influenza B by PCR NEGATIVE NEGATIVE Final    Comment: (NOTE) The Xpert Xpress SARS-CoV-2/FLU/RSV assay is intended as an aid in  the diagnosis of influenza from Nasopharyngeal swab specimens and  should not be used as a sole basis for treatment. Nasal washings and  aspirates are unacceptable for Xpert Xpress SARS-CoV-2/FLU/RSV  testing. Fact Sheet for Patients: PinkCheek.be Fact Sheet for Healthcare Providers: GravelBags.it This test is not yet approved or cleared by the [place] FDA and  has been authorized for detection and/or diagnosis of SARS-CoV-2 by  FDA under an Emergency Use Authorization (EUA). This EUA will remain  in effect (meaning this test can be used) for the duration of the  Covid-19 declaration under Section 564(b)(1) of the Act, 21  U.S.C. section 360bbb-3(b)(1), unless the authorization  is  terminated or revoked. Performed at [facility], [place] [address], [place], [place] [address]   Urine culture     Status: None   Collection Time: 02/08/20  2:00 AM   Specimen: In/Out Cath Urine  Result Value Ref Range Status   Specimen Description   Final    IN/OUT CATH URINE Performed at [facility] [address] [place], [place] [address]    Special Requests   Final    NONE Performed at [facility], [place] [address], [place], [place] [address]    Culture   Final    NO GROWTH Performed at [facility], [place] [address] [place], [place] [address]    Report Status 02/09/2020 FINAL  Final  MRSA PCR Screening     Status: None   Collection Time: 02/09/20  9:50 AM   Specimen: Nasopharyngeal  Result Value Ref Range Status   MRSA by PCR NEGATIVE NEGATIVE Final    Comment:        The GeneXpert MRSA Assay (FDA approved for NASAL specimens only), is one component of a comprehensive MRSA colonization surveillance program. It is not intended to diagnose MRSA infection nor to guide or monitor treatment for MRSA infections. Performed at [facility], [place]  [address], [place], [place] [address]     Labs: BNP (last 3 results) No results for input(s): BNP in the last 8760 hours. Basic Metabolic Panel: Recent Labs  Lab 02/07/20 2203 02/08/20 0440 02/09/20 0603 02/10/20 0456  NA 142 139 137 135  K 4.4 4.2 3.8 4.0  CL 103 103 102 100  CO2 '30 28 27 26  ' GLUCOSE 98 60* 92 89  BUN 24* '20 13 13  ' CREATININE 1.07 0.98 1.19 1.16  CALCIUM 9.9 8.7* 8.9 9.4  MG  --  1.8 1.5* 2.1  PHOS  --   --  3.0 3.2   Liver Function Tests: Recent Labs  Lab 02/07/20 2203 02/08/20 0440 02/09/20 0603 02/10/20 0456  AST '27 23 19 19  ' ALT '23 20 18 17  ' ALKPHOS 88 71 69 72  BILITOT 0.3 0.6 0.5 0.3  PROT 7.3 5.9* 6.0* 6.6  ALBUMIN 3.7 2.9* 3.0* 3.2*   No results for input(s): LIPASE, AMYLASE in the last 168  hours. Recent Labs  Lab 02/09/20 0603  AMMONIA 21   CBC: Recent Labs  Lab 02/07/20 2203 02/08/20 0440 02/09/20 0603 02/10/20 0456  WBC 3.1* 3.2* 4.4 8.4  NEUTROABS 1.9 1.9 2.7 6.1  HGB 8.2* 7.5* 7.9* 9.0*  HCT 28.0* 24.5* 25.4* 28.7*  MCV 87.2 86.9 84.7 84.9  PLT 139* 121* 125* 122*   Cardiac Enzymes: No results for input(s): CKTOTAL, CKMB, CKMBINDEX, TROPONINI in the last 168 hours. BNP: Invalid input(s): POCBNP CBG: Recent Labs  Lab 02/08/20 1537  GLUCAP 101*   D-Dimer No results for input(s): DDIMER in the last 72 hours. Hgb A1c No results for input(s): HGBA1C in the last 72 hours. Lipid Profile No results for input(s): CHOL, HDL, LDLCALC, TRIG, CHOLHDL, LDLDIRECT in the last 72 hours. Thyroid function studies Recent Labs    02/08/20 0440  TSH 6.965*   Anemia work up Recent Labs    02/09/20 0603  VITAMINB12 783  FOLATE 11.5  FERRITIN 31  TIBC 328  IRON 72  RETICCTPCT 0.9   Urinalysis    Component Value Date/Time   COLORURINE YELLOW 02/07/2020 2220   APPEARANCEUR HAZY (A) 02/07/2020 2220   LABSPEC 1.016 02/07/2020 2220   PHURINE 7.0 02/07/2020 2220   GLUCOSEU NEGATIVE 02/07/2020 2220   HGBUR NEGATIVE 02/07/2020 2220   BILIRUBINUR NEGATIVE 02/07/2020 2220   KETONESUR NEGATIVE 02/07/2020 2220   PROTEINUR NEGATIVE 02/07/2020 2220   UROBILINOGEN 0.2 02/18/2011 1015   NITRITE NEGATIVE 02/07/2020 2220   LEUKOCYTESUR NEGATIVE 02/07/2020 2220   Sepsis Labs Invalid input(s): PROCALCITONIN,  WBC,  LACTICIDVEN Microbiology Recent Results (from the past 240 hour(s))  Culture, blood (routine x 2)     Status: None (Preliminary result)   Collection Time: 02/07/20 10:03 PM   Specimen: BLOOD  Result Value Ref Range Status   Specimen Description   Final    BLOOD RIGHT ANTECUBITAL Performed at [facility], [place] [address], [place], [place] [address]    Special Requests   Final    BOTTLES DRAWN AEROBIC AND ANAEROBIC Blood Culture  adequate volume Performed at [facility] [address], [place], [place] [address]    Culture   Final    NO GROWTH 2 DAYS Performed at [facility] [address], [place], [place] [address]    Report Status PENDING  Incomplete  Culture, blood (routine x 2)     Status: None (Preliminary result)   Collection Time: 02/07/20 10:03 PM   Specimen: BLOOD  Result Value Ref Range Status   Specimen Description   Final    BLOOD BLOOD LEFT FOREARM Performed at [facility] [address] [place], [place] [address]    Special Requests   Final    BOTTLES DRAWN AEROBIC AND ANAEROBIC Blood Culture adequate volume Performed at [facility] [address], [place], [place] [address]    Culture   Final    NO GROWTH 2 DAYS Performed at [facility] [address], [place], [place] [address]    Report Status PENDING  Incomplete  Respiratory Panel by RT PCR (Flu A&B, Covid) - Nasopharyngeal Swab     Status: None   Collection Time: 02/07/20 10:20 PM   Specimen: Nasopharyngeal Swab  Result Value Ref Range Status   SARS Coronavirus 2 by RT PCR NEGATIVE NEGATIVE Final    Comment: (NOTE) SARS-CoV-2 target nucleic acids are NOT DETECTED. The SARS-CoV-2 RNA is generally detectable in upper respiratoy specimens during the acute phase of infection. The lowest concentration of SARS-CoV-2 viral copies this assay can detect is 131 copies/mL. A negative result does not preclude SARS-Cov-2 infection and should not be used as the sole basis for treatment or other patient management decisions. A negative result may occur with  improper specimen collection/handling, submission of specimen other than nasopharyngeal swab, presence of viral mutation(s) within the areas targeted by this assay, and inadequate number of viral copies (<131 copies/mL). A negative result must be combined with clinical observations, patient history, and  epidemiological information. The expected result is Negative. Fact Sheet for Patients:  PinkCheek.be Fact Sheet for Healthcare Providers:  GravelBags.it This test is not yet ap proved or cleared by the [place] FDA and  has been authorized for detection and/or diagnosis of SARS-CoV-2 by FDA under an Emergency Use Authorization (EUA). This EUA will remain  in effect (meaning this test can be used) for the duration of the COVID-19 declaration under Section 564(b)(1) of the Act, 21 U.S.C. section 360bbb-3(b)(1), unless the authorization is terminated or revoked sooner.    Influenza A by PCR NEGATIVE NEGATIVE Final   Influenza B by PCR NEGATIVE NEGATIVE Final    Comment: (NOTE) The Xpert Xpress SARS-CoV-2/FLU/RSV assay is intended as an aid in  the diagnosis of influenza from Nasopharyngeal swab specimens and  should not be used as a sole basis for treatment. Nasal washings and  aspirates are unacceptable for Xpert Xpress SARS-CoV-2/FLU/RSV  testing. Fact Sheet for Patients: PinkCheek.be Fact Sheet for Healthcare Providers: GravelBags.it This test is not yet approved or cleared by the [place] FDA and  has been authorized for detection and/or diagnosis of SARS-CoV-2 by  FDA under an Emergency Use Authorization (EUA). This EUA will remain  in effect (meaning this test can be used) for the duration of the  Covid-19 declaration under Section 564(b)(1) of the Act, 21  U.S.C. section 360bbb-3(b)(1), unless the authorization is  terminated or revoked. Performed at [facility], [place] [address] [place], [place] [address]   Urine culture     Status: None   Collection Time: 02/08/20  2:00 AM   Specimen: In/Out Cath Urine  Result Value Ref Range Status   Specimen Description   Final    IN/OUT CATH URINE Performed at [facility] [address] [place], [place] [address]    Special Requests   Final    NONE Performed at [facility], [place] [address] [place], [place]  [address]    Culture   Final    NO GROWTH Performed at [facility], [place] [address], [place], [place] [address]    Report Status 02/09/2020 FINAL  Final  MRSA PCR Screening     Status: None   Collection Time: 02/09/20  9:50 AM   Specimen: Nasopharyngeal  Result Value Ref Range Status   MRSA by PCR NEGATIVE NEGATIVE Final    Comment:        The GeneXpert MRSA Assay (FDA approved for NASAL specimens only), is one component of a comprehensive MRSA colonization surveillance program. It is not intended to diagnose MRSA infection nor to guide or monitor treatment for MRSA infections. Performed at [facility], [place] [address], [place], [place] [address]    Time coordinating discharge: 35 minutes  SIGNED:  Kerney Elbe, DO [facility] 02/10/2020, 2:53 PM Pager is on [place]  If 7PM-7AM, please contact night-coverage [email or web address]

## 2020-02-10 NOTE — TOC Transition Note (Signed)
Transition of Care [facility]) - CM/SW Discharge Note   Patient Details  Name: Christian Bennett MRN: [id number] Date of Birth: 06/27/1928  [facility] CM/SW Contact:  Ova Freshwater Phone Number: [phone] 02/10/2020, 3:45 PM   Clinical Narrative:     Patient transferring to [facility] for palliative care. Report # [id number], Room #10.  MD/RN staff notified, Facesheet, Medical transfer paperwork and DNR in patient file. Spoke to [employer], [place], patient's family notified of transfer.  Final next level of care: Memory Care Barriers to Discharge: Continued Medical Work up   Patient Goals and CMS Choice Patient states their goals for this hospitalization and ongoing recovery are:: Patient's daughter would like him to return to [facility] on [address], if he can.  If he needs to go to SNF for rehab, she is open to the idea as well. CMS Medicare.gov Compare Post Acute Care list provided to:: Patient Represenative (must comment) Choice offered to / list presented to : Adult Children  Discharge Placement                       Discharge Plan and Services In-house Referral: Clinical Social Work   Post Acute Care Choice: ([facility] on [place])                               Social Determinants of Health (SDOH) Interventions     Readmission Risk Interventions Readmission Risk Prevention Plan 11/18/2019  Transportation Screening Complete  PCP or Specialist Appt within 3-5 Days Complete  HRI or [place] Complete  Social Work Consult for [place] Planning/Counseling Complete  Palliative Care Screening (No Data)  Medication Review [employer]) Complete  Some recent data might be hidden

## 2020-02-10 NOTE — Progress Notes (Signed)
[facility] Documentation     Pt has been approved for [employer] transfer. [facility] does have a bed available for pt today. Paperwork has been completed and transportation can be arranged.      Please call [place] at [phone] to give charge nurse report and fax discharge summary to [phone].     Please dc any lines. May leave catheter in place if pt has one. Please send pt to [facility] with DNR paperwork.      Please call with any questions.      Thank you,   Freddie Breech, RN  [phone]

## 2020-02-10 NOTE — Progress Notes (Signed)
PT Cancellation Note  Patient Details Name: Christian Bennett MRN: [id number] DOB: 1927/10/19   Cancelled Treatment:    Reason Eval/Treat Not Completed: Fatigue/lethargy limiting ability to participate. Will check back another day.   Doreatha Massed, PT Acute Rehabilitation

## 2020-02-10 NOTE — Progress Notes (Signed)
PROGRESS NOTE    Christian Bennett  XBM:[id number] DOB: January 09, 1928 DOA: 02/07/2020 PCP: Roetta Sessions, NP   Brief Narrative:  The patient is a 86 year old elderly Caucasian male from a Memory care ALF with a past medical history significant for but not limited to dementia, hypothyroidism, hypertension, coronary artery stenosis, GERD, recurrent falls, history, history of CVA, mood disorder as well as other comorbidities who presents to the ED after a fall at his SNF.  Patient had a witnessed fall out of his wheelchair and hit his head reportedly he is typically alert, ambulatory, and conversive and interactive with staff.  Reportedly he became more agitated recently had been given Ativan for treatment of his agitation.  Currently he is unable to provide a subjective history due to his status.  He underwent imaging studies while he was in the ED and had a head CT and a C-spine.  There is no acute abnormality but there is a stable infarcts involving the right frontal parietal and occipital lobes as well as scattered bilateral basal ganglia lacunar infarcts.  Hypothermic in the ED as well with a temperature of 90.  He continued to be encephalopathic and unresponsive and does not really wake up much and has continued to decline further.  Further work-up was nitiated with an ABG, EEG, as well as a MRI of the brain however EEG and MRI not done after Palliative Care Discussion with the Family. The family has elected Comfort Measures given his lack of improvement and understand that he is dying. The family requested transfer to [facility]idential [facility] at [facility].   Assessment & Plan:   Active Problems:   HTN (hypertension)   Hypothyroidism   CVA (cerebral vascular accident) ([place])   Hyperlipidemia   H/O carotid endarterectomy   Cognitive dysfunction   Sepsis ([place])   Vascular dementia ([place])   Encephalopathy  Initially felt to have sepsis with unknown source however do not acutely believe he is  infected; now more likely SIRS - patient presents with AMS, bradycardic, neutropenic, hypotensive, and hypothermic - unclear source if any at this time, but treated with empiric abx and follow up cultures; we will stop all antibiotics now and observe - UA surprisingly negative after cath specimen; initially thought to be the source - Initially continued vanc/cefepime/flagyl but will D/C now  -Continue Bair hugger given his Hypothermia if needed -Avoid further sedating agents. Do not see chronic benzos on the database;unclear how long he has been receiving at the SNF -C/w Fluid Hydration with D5 1/2 NS at 75 mL/hr for now but will stop if patient gets more volume overloaded and starts to swell  -Procalcitonin level was less than 0.10x2 and we will likely de-escalate antibiotics further given that he has unremarkable urinalysis, negative and showed no growth and because his blood cultures are no growth to date, he is afebrile now and leukopenia has resolved -Chest x-ray showed "No acute cardiopulmonary or traumatic findings in the chest. Chronically coarsened interstitium." -Palliative met with patient's daughter and after a lengthy discussion of the decision was made to make the patient comfort care and he will likely be transferred to [facility] once bed is available -Comfort measures have been enacted and patient is acetaminophen, dextrose, glycopyrrolate, haloperidol, lorazepam, ondansetron, morphine pushes as well as Liquifilm Tears for his comfort.  Acute Encephalopathy superimposed on vascular dementia with behavioral disturbances, not improving - presumed toxic metabolic at this time but his electrolytes are now improved; unclear if this is progression of his  dementia - continue abx as above, IVF, and sepsis workup as above - if still doesn't clear may need to consider non-convulsive seizures given encephalomalacia from previous stroke and recurrent falls -EEG has been ordered and will  also order an MRI of the brain as well as a ABG to rule out hypercarbia or somnolence however this is been canceled after lengthy discussion with palliative care and the family the patient's family has decided to make him comfort care - Patient Still Encephalopathic so will pursue further testing - Check RPR and was nonreactive, B12 was 783, and Ammonia Level 21 - Place on Delrium Precautions -Will Get SLP but is not awake enough so we will obtain an ABG given his somnolence -Follow-up on ABG, EEG, MRI; EEG and MRI have not been canceled -ABG done and showed pH of 7.431, PCO2 of 44.2, PO2 of 70.1, bicarbonate level 20.9, and ABG O2 saturation of 92.6% on 21% FiO2 -Palliative care consulted for further evaluation recommendations and to have a GOC Discussion and after a lengthy discussion the goals of care have changed and the patient is to be comfort care given his worsening and decompensation.  Palliative discussed with the family and the patient's daughter and her sisters have discussed that the patient is dying and they want to ensure that he has a comfortable in dignified death is possible they are acceptable to comfort focused approach and he will be transition to residential hospice   Hypothyroidism -Checked TSH and was 6.965 and free T4 was 0.70 and T3 was extremely low -Takes NP Thyroid 25 mg po Daily  -We will initiate thyroid replacement for now given IV Synthroid micrograms   Hypertension -Hold home meds in setting of hypotension (BPwas 98/55) because he is not awake enough -We will not check blood pressures carefully now that he is comfort care  History of a right-sided brain CVA  History of carotid stenosis status post right CEA -Back in March his aspirin was recently discontinued due to concerns of GI bleed Continue with atorvastatin -Follows up with neurology in outpatient setting and with vascular surgery Dr. Donnetta Hutching however patient will be comfort care now  Pancytopenia   -Mild -Patient's WBC was 3.2, hemoglobin/hematocrit dropped to 7.5/24.5 and platelet count was 121,000 on admission -Now patient's WBC is 4.4, hemoglobin/hematocrit 7.9/25.4, and platelet count is slowly improving and is now 125,000 -Checked anemia panel and showed an iron level of 72, U IBC of 256, TIBC 328, saturation ratio 22%, ferritin level 31, folate level 11.5 and vitamin B12 783 -Continue to monitor for signs and symptoms of bleeding; currently no overt bleeding or -Likely this is in the setting of his sepsis -Will not repeat CBC with differential and and given that he is comfort care  Hypoglycemia -Mild with a Blood Sugar of 60 today but this is now improved as blood sugar was 89 -Changed IVF to have D5W -Continue to Monitor Blood Sugars Carefully and will continue fluid as above but will stop if he becomes volume overloaded  Hypomagnesemia -Patient magnesium level of 1.5 and is now improved to 2.1 -Replete with IV mag sulfate 3 g times yesterday -Continue to monitor and replete as necessary -Repeat magnesium level in a.m.  GOC: DNR,poA  DVT prophylaxis: Enoxaparin 40 mg sq q24h to be stopped Code Status: DO NOT RESUSCITATE  Family Communication: Discussed with the daughter at bedside Disposition Plan: Patient is from a memory [facility] unit at [facility] and he is not really responsive or waking up as  much. He continues to have persistent encephalopathy and will need a PT OT evaluation as well as an SLP for at least a safe discharge disposition however after a lengthy discussion between palliative care and the patient's family the patient will be made comfort care and will be transition to a residential hospice once bed is available  Status is: Inpatient  Remains inpatient appropriate because:Altered mental status and IV treatments appropriate due to intensity of illness or inability to take PO   Dispo: The patient is from: SNF              Anticipated d/c is to: Residential  hospice              Anticipated d/c date is: 1 day              Patient currently is not medically stable to d/c.  Consultants:   Palliative Care Medicine   Procedures:  None  Antimicrobials:  Anti-infectives (From admission, onward)   Start     Dose/Rate Route Frequency Ordered Stop   02/08/20 2200  vancomycin (VANCOREADY) IVPB 1250 mg/250 mL  Status:  Discontinued     1,250 mg 166.7 mL/hr over 90 Minutes Intravenous Every 24 hours 02/08/20 0447 02/09/20 1333   02/08/20 1000  ceFEPIme (MAXIPIME) 2 g in sodium chloride 0.9 % 100 mL IVPB  Status:  Discontinued     2 g 200 mL/hr over 30 Minutes Intravenous 2 times daily 02/08/20 0300 02/09/20 1333   02/08/20 1000  metroNIDAZOLE (FLAGYL) IVPB 500 mg  Status:  Discontinued     500 mg 100 mL/hr over 60 Minutes Intravenous Every 8 hours 02/08/20 0300 02/09/20 1333   02/08/20 0030  ceFEPIme (MAXIPIME) 2 g in sodium chloride 0.9 % 100 mL IVPB     2 g 200 mL/hr over 30 Minutes Intravenous  Once 02/08/20 0020 02/08/20 0140   02/08/20 0030  metroNIDAZOLE (FLAGYL) IVPB 500 mg     500 mg 100 mL/hr over 60 Minutes Intravenous  Once 02/08/20 0020 02/08/20 0154   02/08/20 0030  vancomycin (VANCOCIN) IVPB 1000 mg/200 mL premix  Status:  Discontinued     1,000 mg 200 mL/hr over 60 Minutes Intravenous  Once 02/08/20 0020 02/08/20 0023   02/08/20 0030  vancomycin (VANCOREADY) IVPB 1500 mg/300 mL     1,500 mg 150 mL/hr over 120 Minutes Intravenous  Once 02/08/20 0023 02/08/20 0349     Subjective: Seen and examined at bedside and he does not really respond and is worse than yesterday.  He remains encephalopathic and persistently altered.  I spoke with the daughter at bedside and who felt that patient had worsened from yesterday to today.  After lengthy discussion between palliative care and the patient daughter the plan is to move forward with comfort care and transition to a residential hospice.  Objective: Vitals:   02/09/20 0842 02/09/20  1408 02/09/20 2038 02/10/20 0447  BP: 131/86 (!) 148/75 (!) 172/83 (!) 153/67  Pulse: 72 76 72 77  Resp: '18 15 18 16  ' Temp: 98.2 F (36.8 C) 98.8 F (37.1 C) 97.8 F (36.6 C) 98.2 F (36.8 C)  TempSrc: Oral Oral Oral Oral  SpO2: 98% 94% 97% 95%  Weight:      Height:        Intake/Output Summary (Last 24 hours) at 02/10/2020 1248 Last data filed at 02/10/2020 0940 Gross per 24 hour  Intake 825.28 ml  Output 2375 ml  Net -1549.72 ml   Danley Danker  Weights   02/07/20 2019 02/08/20 2050 02/09/20 0450  Weight: 77.1 kg 68.9 kg 69.4 kg   Examination: Physical Exam:  Constitutional: The patient is a thin elderly chronically ill-appearing Caucasian male currently in no acute distress but he is not awake enough and does not arouse to physical or verbal stimuli. Eyes: Lids and conjunctivae normal, sclerae anicteric  ENMT: External Ears, Nose appear normal.  Hard of hearing Neck: Appears normal, supple, no cervical masses, normal ROM, no appreciable thyromegaly Respiratory: Diminished to auscultation bilaterally with some coarse breath sounds and some mild rhonchi but no appreciable wheezing, rales, crackles. No accessory muscle use.  Unlabored breathing Cardiovascular: RRR, no murmurs / rubs / gallops. S1 and S2 auscultated.  Minimal extremity edema.  Abdomen: Soft, non-tender, nondistended. No masses palpated. No appreciable hepatosplenomegaly. Bowel sounds positive.  GU: Deferred. Musculoskeletal: No clubbing / cyanosis of digits/nails. No joint deformity upper and lower extremities.  Skin: No rashes, lesions, ulcers on limited skin evaluation. No induration; Warm and dry.  Neurologic: Is not wake to follow commands and does not arouse Psychiatric: Impaired judgment and insight.  Not alert and oriented x3.  Somnolent and very difficult to arouse.   Data Reviewed: I have personally reviewed following labs and imaging studies  CBC: Recent Labs  Lab 02/07/20 2203 02/08/20 0440  02/09/20 0603 02/10/20 0456  WBC 3.1* 3.2* 4.4 8.4  NEUTROABS 1.9 1.9 2.7 6.1  HGB 8.2* 7.5* 7.9* 9.0*  HCT 28.0* 24.5* 25.4* 28.7*  MCV 87.2 86.9 84.7 84.9  PLT 139* 121* 125* 641*   Basic Metabolic Panel: Recent Labs  Lab 02/07/20 2203 02/08/20 0440 02/09/20 0603 02/10/20 0456  NA 142 139 137 135  K 4.4 4.2 3.8 4.0  CL 103 103 102 100  CO2 '30 28 27 26  ' GLUCOSE 98 60* 92 89  BUN 24* '20 13 13  ' CREATININE 1.07 0.98 1.19 1.16  CALCIUM 9.9 8.7* 8.9 9.4  MG  --  1.8 1.5* 2.1  PHOS  --   --  3.0 3.2   GFR: Estimated Creatinine Clearance: 42.4 mL/min (by C-G formula based on SCr of 1.16 mg/dL). Liver Function Tests: Recent Labs  Lab 02/07/20 2203 02/08/20 0440 02/09/20 0603 02/10/20 0456  AST '27 23 19 19  ' ALT '23 20 18 17  ' ALKPHOS 88 71 69 72  BILITOT 0.3 0.6 0.5 0.3  PROT 7.3 5.9* 6.0* 6.6  ALBUMIN 3.7 2.9* 3.0* 3.2*   No results for input(s): LIPASE, AMYLASE in the last 168 hours. Recent Labs  Lab 02/09/20 0603  AMMONIA 21   Coagulation Profile: Recent Labs  Lab 02/07/20 2203  INR 1.2   Cardiac Enzymes: No results for input(s): CKTOTAL, CKMB, CKMBINDEX, TROPONINI in the last 168 hours. BNP (last 3 results) No results for input(s): PROBNP in the last 8760 hours. HbA1C: No results for input(s): HGBA1C in the last 72 hours. CBG: Recent Labs  Lab 02/08/20 1537  GLUCAP 101*   Lipid Profile: No results for input(s): CHOL, HDL, LDLCALC, TRIG, CHOLHDL, LDLDIRECT in the last 72 hours. Thyroid Function Tests: Recent Labs    02/08/20 0440 02/08/20 1541  TSH 6.965*  --   FREET4  --  0.70   Anemia Panel: Recent Labs    02/09/20 0603  VITAMINB12 783  FOLATE 11.5  FERRITIN 31  TIBC 328  IRON 72  RETICCTPCT 0.9   Sepsis Labs: Recent Labs  Lab 02/07/20 2150 02/08/20 1541 02/09/20 0603 02/10/20 0456  PROCALCITON  --  <0.10 <0.10 <  0.10  LATICACIDVEN 1.4  --   --   --     Recent Results (from the past 240 hour(s))  Culture, blood (routine x  2)     Status: None (Preliminary result)   Collection Time: 02/07/20 10:03 PM   Specimen: BLOOD  Result Value Ref Range Status   Specimen Description   Final    BLOOD RIGHT ANTECUBITAL Performed at [facility] [address], [place], [place] [address]    Special Requests   Final    BOTTLES DRAWN AEROBIC AND ANAEROBIC Blood Culture adequate volume Performed at [facility] [address], [place], [place] [address]    Culture   Final    NO GROWTH 2 DAYS Performed at [facility] [address] [place], [place] [address]    Report Status PENDING  Incomplete  Culture, blood (routine x 2)     Status: None (Preliminary result)   Collection Time: 02/07/20 10:03 PM   Specimen: BLOOD  Result Value Ref Range Status   Specimen Description   Final    BLOOD BLOOD LEFT FOREARM Performed at [facility] [address], [place], [place] [address]    Special Requests   Final    BOTTLES DRAWN AEROBIC AND ANAEROBIC Blood Culture adequate volume Performed at [facility] [address], [place], [place] [address]    Culture   Final    NO GROWTH 2 DAYS Performed at [facility] [address], [place], [place] [address]    Report Status PENDING  Incomplete  Respiratory Panel by RT PCR (Flu A&B, Covid) - Nasopharyngeal Swab     Status: None   Collection Time: 02/07/20 10:20 PM   Specimen: Nasopharyngeal Swab  Result Value Ref Range Status   SARS Coronavirus 2 by RT PCR NEGATIVE NEGATIVE Final    Comment: (NOTE) SARS-CoV-2 target nucleic acids are NOT DETECTED. The SARS-CoV-2 RNA is generally detectable in upper respiratoy specimens during the acute phase of infection. The lowest concentration of SARS-CoV-2 viral copies this assay can detect is 131 copies/mL. A negative result does not preclude SARS-Cov-2 infection and should not be used as the sole basis for treatment or other patient management  decisions. A negative result may occur with  improper specimen collection/handling, submission of specimen other than nasopharyngeal swab, presence of viral mutation(s) within the areas targeted by this assay, and inadequate number of viral copies (<131 copies/mL). A negative result must be combined with clinical observations, patient history, and epidemiological information. The expected result is Negative. Fact Sheet for Patients:  PinkCheek.be Fact Sheet for Healthcare Providers:  GravelBags.it This test is not yet ap proved or cleared by the [place] FDA and  has been authorized for detection and/or diagnosis of SARS-CoV-2 by FDA under an Emergency Use Authorization (EUA). This EUA will remain  in effect (meaning this test can be used) for the duration of the COVID-19 declaration under Section 564(b)(1) of the Act, 21 U.S.C. section 360bbb-3(b)(1), unless the authorization is terminated or revoked sooner.    Influenza A by PCR NEGATIVE NEGATIVE Final   Influenza B by PCR NEGATIVE NEGATIVE Final    Comment: (NOTE) The Xpert Xpress SARS-CoV-2/FLU/RSV assay is intended as an aid in  the diagnosis of influenza from Nasopharyngeal swab specimens and  should not be used as a sole basis for treatment. Nasal washings and  aspirates are unacceptable for Xpert Xpress SARS-CoV-2/FLU/RSV  testing. Fact Sheet for  Patients: PinkCheek.be Fact Sheet for Healthcare Providers: GravelBags.it This test is not yet approved or cleared by the [place] FDA and  has been authorized for detection and/or diagnosis of SARS-CoV-2 by  FDA under an Emergency Use Authorization (EUA). This EUA will remain  in effect (meaning this test can be used) for the duration of the  Covid-19 declaration under Section 564(b)(1) of the Act, 21  U.S.C. section 360bbb-3(b)(1), unless the authorization  is  terminated or revoked. Performed at [facility], [place] [address] [place], [place] [address]   Urine culture     Status: None   Collection Time: 02/08/20  2:00 AM   Specimen: In/Out Cath Urine  Result Value Ref Range Status   Specimen Description   Final    IN/OUT CATH URINE Performed at [facility] [address], [place], [place] [address]    Special Requests   Final    NONE Performed at [facility], [place] [address], [place], [place] [address]    Culture   Final    NO GROWTH Performed at [facility], [place] [address], [place], [place] [address]    Report Status 02/09/2020 FINAL  Final  MRSA PCR Screening     Status: None   Collection Time: 02/09/20  9:50 AM   Specimen: Nasopharyngeal  Result Value Ref Range Status   MRSA by PCR NEGATIVE NEGATIVE Final    Comment:        The GeneXpert MRSA Assay (FDA approved for NASAL specimens only), is one component of a comprehensive MRSA colonization surveillance program. It is not intended to diagnose MRSA infection nor to guide or monitor treatment for MRSA infections. Performed at [facility], [place] [address] [place],  [address]     RN Pressure Injury Documentation:     Estimated body mass index is 21.95 kg/m as calculated from the following:   Height as of this encounter: '5\' 10"'  (1.778 m).   Weight as of this encounter: 69.4 kg.  Malnutrition Type:      Malnutrition Characteristics:      Nutrition Interventions:     Radiology Studies: No results found. Scheduled Meds: . Chlorhexidine Gluconate Cloth  6 each Topical Daily  . erythromycin   Both Eyes Q6H  . levothyroxine  25 mcg Intravenous Daily   Continuous Infusions: . dextrose 5 % and 0.45 % NaCl with KCl 10 mEq/L 75 mL/hr at 02/10/20 0401    LOS: 2 days   Kerney Elbe, DO [facility] PAGER is on AMION  If 7PM-7AM, please contact  night-coverage [email or web address]

## 2020-02-10 NOTE — Progress Notes (Signed)
OT Cancellation Note  Patient Details Name: Christian Bennett MRN: [id number] DOB: [birth year]   Cancelled Treatment:    Reason Eval/Treat Not Completed: Fatigue/lethargy limiting ability to participate(Discussion with daughter in room in regards to letting her father sleep. Patient able to open his eyes with sternal rub but unable to maintain a functional level of alertness. Patient not following commands.)  Christian Bennett 02/10/2020, 1:07 PM

## 2020-02-10 NOTE — Consult Note (Signed)
Consultation Note Date: 02/10/2020   Patient Name: Christian Bennett  DOB: 11-May-1928  MRN: [id number]  Age / Sex: 86 y.o., male  PCP: Roetta Sessions, NP Referring Physician: Kerney Elbe, DO  Reason for Consultation: Establishing goals of care in light of dementia with acute decline with hypothermia, somnolence, and no p.o. intake   Clinical Assessment and Goals of Care: Palliative care consult received.  Chart reviewed including personal review of pertinent labs and imaging.  Discussed case with Dr. Alfredia Ferguson.  Briefly, Christian Bennett is an 86 year old male with past medical history of dementia, hypothyroidism, hypertension, coronary disease, GERD, recurrent falls, CVA, lives in memory care unit at [facility] who presented following a fall out of his wheelchair and reportedly hitting his head.  At baseline, he is alert, ambulatory and conversive and interactive with staff.  Since admission, he has been largely somnolent and work-up has included CT, antibiotics due to concern for sepsis (now discontinued), ABG and lab work.  At this point, EEG and MRI are ordered and pending.  I met today with his daughter/HC POA, Judson Roch.  I introduced palliative care as specialized medical care for people living with serious illness. It focuses on providing relief from the symptoms and stress of a serious illness. The goal is to improve quality of life for both the patient and the family.  Judson Roch reports that her father is a fun-loving, independent man who enjoyed square dancing, clogging, ballroom dancing and gardening.  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  He has 2 children and 4 grandchildren.  He grew up [employer] but is no longer part of the [employer] as he married someone not of the [employer].  He is widowed.  Sarah and I discussed clinical  course as well as wishes moving forward in regard to care plan for her father.  She is a Marine scientist and reports that she and her sister have been discussing that Christian Bennett is dying and they want to ensure that he has as comfortable and dignified to death as possible.  We discussed difference between a aggressive medical intervention path and a palliative, comfort focused care path.    Concept of hospice was discussed and differences in home, residential, and long-term care facility with hospice support were discussed.  At this point, Judson Roch reports that she and her family want to ensure that Christian Bennett is as comfortable as possible and are in agreement that he would best be served by transition to residential hospice.  Questions and concerns addressed.   PMT will continue to support holistically.  SUMMARY OF RECOMMENDATIONS   - DNR/DNI - Plan for full comfort care moving forward -Family would like to work to transition to residential hospice for end-of-life care.  Preferences for [facility] due to family location.  I have placed consult to social work to help facilitate and also called and discussed case with [facility] liaison.  Code Status/Advance Care Planning:  DNR  Symptom Management:   Pain/shortness of breath: Morphine as needed  Anxiety: Ativan as needed  Agitation: Haldol as needed  Excess secretions: Robinul as needed  Palliative Prophylaxis:   Frequent Pain Assessment  Additional Recommendations (Limitations, Scope, Preferences):  Full Comfort Care 32 Psycho-social/Spiritual:   Desire for further Chaplaincy support:no  Additional Recommendations: Grief/Bereavement Support  Prognosis:  < 2 weeks-Christian Bennett is largely unresponsive.  He appears to be actively dying and showing periods of terminal agitation.  He is not taking in any nutrition or hydration.  At this point, he is still receiving IV hydration, but this is as a very time limited measure with the  hope he will be able to be transition to residential hospice to allow more family to see him before he dies.   Discharge Planning: Hospice facility      Primary Diagnoses: Present on Admission: . Sepsis ([place]) . Hypothyroidism . Hyperlipidemia . HTN (hypertension) . CVA (cerebral vascular accident) ([place])   I have reviewed the medical record, interviewed the patient and family, and examined the patient. The following aspects are pertinent.  Past Medical History:  Diagnosis Date  . Acid reflux   . Carotid artery occlusion   . Dementia ([place])   . Hypertension   . Hypothyroidism   . Thyroid disease    Social History   Socioeconomic History  . Marital status: Widowed    Spouse name: Not on file  . Number of children: Not on file  . Years of education: Not on file  . Highest education level: Not on file  Occupational History  . Occupation: retired  Tobacco Use  . Smoking status: Former Smoker    Quit date: 1953    Years since quitting: 68.3  . Smokeless tobacco: Former Systems developer    Types: Chew  Substance and Sexual Activity  . Alcohol use: Yes    Comment: 2oz Vodka a day - family regulates daily  . Drug use: No  . Sexual activity: Not Currently    Birth control/protection: None  Other Topics Concern  . Not on file  Social History Narrative  . Not on file   Social Determinants of Health   Financial Resource Strain: Low Risk   . Difficulty of Paying Living Expenses: Not hard at all  Food Insecurity: No Food Insecurity  . Worried About Charity fundraiser in the Last Year: Never true  . Ran Out of Food in the Last Year: Never true  Transportation Needs: Unknown  . Lack of Transportation (Medical): No  . Lack of Transportation (Non-Medical): Not on file  Physical Activity: Inactive  . Days of Exercise per Week: 0 days  . Minutes of Exercise per Session: 0 min  Stress: No Stress Concern Present  . Feeling of Stress : Only a little  Social Connections: Slightly  Isolated  . Frequency of Communication with Friends and Family: Twice a week  . Frequency of Social Gatherings with Friends and Family: Once a week  . Attends Religious Services: More than 4 times per year  . Active Member of Clubs or Organizations: Yes  . Attends Archivist Meetings: 1 to 4 times per year  . Marital Status: Widowed   Family History  Problem Relation Age of Onset  . Stroke Mother   . Stroke Father    Scheduled Meds: . Chlorhexidine Gluconate Cloth  6 each Topical Daily  . erythromycin   Both Eyes Q6H  . levothyroxine  25 mcg Intravenous Daily  Continuous Infusions: . dextrose 5 % and 0.45 % NaCl with KCl 10 mEq/L 75 mL/hr at 02/10/20 0401   PRN Meds:.acetaminophen **OR** acetaminophen, antiseptic oral rinse, glycopyrrolate **OR** glycopyrrolate **OR** glycopyrrolate, haloperidol **OR** haloperidol **OR** haloperidol lactate, LORazepam **OR** LORazepam **OR** LORazepam, morphine injection, ondansetron **OR** ondansetron (ZOFRAN) IV, polyvinyl alcohol Medications Prior to Admission:  Prior to Admission medications   Medication Sig Start Date End Date Taking? Authorizing Provider  amLODipine (NORVASC) 5 MG tablet Take 1 tablet (5 mg total) by mouth daily. 11/14/19  Yes Rai, Ripudeep K, MD  atorvastatin (LIPITOR) 40 MG tablet Take 1 tablet (40 mg total) by mouth daily at 6 PM. No more rfs-->  Future Refills per long term care facility pt is entering jan 25-27, 2021 10/25/19  Yes Opalski, Deborah, DO  divalproex (DEPAKOTE) 125 MG DR tablet Take 1 tablet (125 mg total) by mouth daily. Patient taking differently: Take 125 mg by mouth 3 (three) times daily.  10/01/19  Yes McCue, Janett Billow, NP  fluticasone (FLONASE) 50 MCG/ACT nasal spray Place 2 sprays into both nostrils as needed for allergies or rhinitis.    Yes [provider]  iron polysaccharides (NIFEREX) 150 MG capsule Take 1 capsule (150 mg total) by mouth daily. 11/14/19  Yes Rai, Ripudeep K, MD    lipase/protease/amylase (CREON) 36000 UNITS CPEP capsule Take 36,000 Units by mouth 3 (three) times daily with meals.   Yes [provider]  LORazepam (ATIVAN) 0.5 MG tablet Take 0.5 mg by mouth See admin instructions. 0.14m twice daily And 0.254mevery 12 hours as needed for mood disorder   Yes [provider]  magnesium oxide (MAG-OX) 400 (241.3 Mg) MG tablet Take 1 tablet (400 mg total) by mouth daily. 07/12/19  Yes Vann, Jessica U, DO  melatonin 5 MG TABS Take 10 mg by mouth at bedtime.    Yes [provider]  omeprazole-sodium bicarbonate (ZEGERID) 40-1100 MG capsule Take 1 capsule by mouth 2 (two) times daily before a meal. Transition to once daily after 1 month 11/19/19 02/08/20 Yes PoElodia Florence MD  ondansetron (ZOFRAN) 4 MG tablet Take 4 mg by mouth daily as needed for nausea or vomiting.   Yes [provider]  QUEtiapine (SEROQUEL) 25 MG tablet Take 0.5 tablets (12.5 mg total) by mouth at bedtime. Patient taking differently: Take 25 mg by mouth at bedtime.  11/14/19  Yes Rai, Ripudeep K, MD  risperiDONE (RISPERDAL) 0.25 MG tablet Take 0.25 mg by mouth in the morning, at noon, and at bedtime.   Yes [provider]  senna (SENOKOT) 8.6 MG TABS tablet Take 1 tablet (8.6 mg total) by mouth 2 (two) times daily. 11/14/19  Yes Rai, Ripudeep K, MD  sertraline (ZOLOFT) 25 MG tablet Take 1 tablet (25 mg total) by mouth daily. No more rfs-->  Future Refills per long term care facility pt is entering jan 25-27, 2021 Patient taking differently: Take 25 mg by mouth daily.  10/25/19  Yes Opalski, DeNeoma LamingDO  Skin Protectants, Misc. (EUCERIN) cream APPLY TWICE DAILY AS NEEDED FOR RASH OR ITCHING Patient taking differently: Apply 1 application topically every 12 (twelve) hours as needed (rash or itching).  12/04/18  Yes Opalski, DeNeoma LamingDO  thyroid (NP THYROID) 60 MG tablet TAKE 1 TABLET BY MOUTH ONCE DAILY BEFORE BREAKFAST  No more rfs-->  Future Refills  per long term care facility pt is entering jan 25-27, 2021 Patient taking differently: Take 60 mg by mouth daily before breakfast.  10/25/19  Yes Opalski, Deborah, DO  triamcinolone cream (KENALOG) 0.1 % Apply 1 application topically as needed (rash).   Yes [provider]  Vitamin D, Ergocalciferol, (DRISDOL) 1.25 MG (50000 UNIT) CAPS capsule Take 1 capsule (50,000 Units total) by mouth every 7 (seven) days. No more rfs-->  Future Refills per long term care facility pt is entering jan 25-27, 2021 Patient taking differently: Take 50,000 Units by mouth every 7 (seven) days.  10/25/19  Yes Opalski, Neoma Laming, DO   Allergies  Allergen Reactions  . Doxycycline Hives  . Eggs Or Egg-Derived Products     UNSPECIFIED REACTION  >> "Sick"   Review of Systems  Unable to obtain  Physical Exam  General: Opens eyes but no meaningful response.  Picking of blankets noted.  Appears restless  HEENT: No bruits, no goiter, no JVD Heart: Regular rate and rhythm. No murmur appreciated. Lungs: Decreased air movement, scattered coarse, ?  early Cheyne-Stokes Abdomen: Soft, nontender, nondistended, positive bowel sounds.  Ext: No significant edema Skin: Warm and dry  Vital Signs: BP (!) 158/63 (BP Location: Left Arm)   Pulse 67   Temp 98.5 F (36.9 C) (Axillary)   Resp 16   Ht '5\' 10"'  (1.778 m)   Wt 69.4 kg   SpO2 98%   BMI 21.95 kg/m  Pain Scale: PAINAD   Pain Score: 0-No pain   SpO2: SpO2: 98 % O2 Device:SpO2: 98 % O2 Flow Rate: .   IO: Intake/output summary:   Intake/Output Summary (Last 24 hours) at 02/10/2020 1404 Last data filed at 02/10/2020 0940 Gross per 24 hour  Intake 825.28 ml  Output 2375 ml  Net -1549.72 ml    LBM: Last BM Date: (UTA) Baseline Weight: Weight: 77.1 kg Most recent weight: Weight: 69.4 kg     Palliative Assessment/Data:   Flowsheet Rows     Most Recent Value  Intake Tab  Referral Department  Hospitalist  Unit at Time of Referral  Med/Surg Unit    Palliative Care Primary Diagnosis  Neurology  Date Notified  02/09/20  Palliative Care Type  New Palliative care  Reason for referral  Clarify Goals of Care  Date of Admission  02/07/20  Date first seen by Palliative Care  02/10/20  # of days Palliative referral response time  1 Day(s)  # of days IP prior to Palliative referral  2  Clinical Assessment  Palliative Performance Scale Score  10%  Psychosocial & Spiritual Assessment  Palliative Care Outcomes  Patient/Family meeting held?  Yes  Who was at the meeting?  daughter/HCPOA  Palliative Care Outcomes  Clarified goals of care, Counseled regarding hospice, Changed to focus on comfort      Time In: 1150 Time Out: 1305 Time Total: 75 Greater than 50%  of this time was spent counseling and coordinating care related to the above assessment and plan.  Signed by: Micheline Rough, MD   Please contact Palliative Medicine Team phone at [phone] for questions and concerns.  For individual provider: See Shea Evans

## 2020-02-10 NOTE — Progress Notes (Signed)
Discharge report called to Clarene Critchley at [facility]. [facility] to transport pt.Eulas Post, RN

## 2020-02-13 LAB — CULTURE, BLOOD (ROUTINE X 2)
Culture: NO GROWTH
Culture: NO GROWTH
Special Requests: ADEQUATE
Special Requests: ADEQUATE

## 2020-02-20 ENCOUNTER — Telehealth: Payer: Self-pay | Admitting: Adult Health

## 2020-02-20 NOTE — Telephone Encounter (Signed)
Noted, card to sign.

## 2020-02-20 NOTE — Telephone Encounter (Signed)
Thank you  for update

## 2020-02-20 NOTE — Telephone Encounter (Signed)
Pt's daughter Tim Lair called to inform us that the pt has passed away on Mar 09, 2020.

## 2020-02-27 DIAGNOSIS — R269 Unspecified abnormalities of gait and mobility: Secondary | ICD-10-CM | POA: Diagnosis not present

## 2020-02-27 DIAGNOSIS — I69398 Other sequelae of cerebral infarction: Secondary | ICD-10-CM | POA: Diagnosis not present

## 2020-02-27 DIAGNOSIS — D62 Acute posthemorrhagic anemia: Secondary | ICD-10-CM | POA: Diagnosis not present

## 2020-03-04 ENCOUNTER — Telehealth: Payer: Self-pay | Admitting: Vascular Surgery

## 2020-03-04 DEATH — deceased

## 2020-06-12 ENCOUNTER — Ambulatory Visit: Payer: Medicare Other | Admitting: Adult Health

## 2020-11-14 IMAGING — MR MR MRA NECK WO/W CM
6 series · 45 of 48 positions shown · IV contrast (Gadavist)
Comparison: None.

CLINICAL DATA: Left facial droop

EXAM:
MRA NECK WITHOUT AND WITH CONTRAST
TECHNIQUE: Multiplanar and multiecho pulse sequences of the neck were obtained
without and with intravenous contrast. Angiographic images of the
neck were obtained using MRA technique without and with intravenous
contrast.
CONTRAST:  7mL GADAVIST GADOBUTROL 1 MMOL/ML IV SOLN

[Series 6: tof_fl3d_tra_iso · axial · 0.6mm · 0.52mm/px · z∈[-172,-92]mm · 9 of 133 slices shown]
[im 1/133]
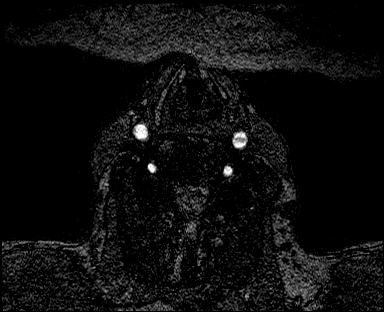
[im 17/133]
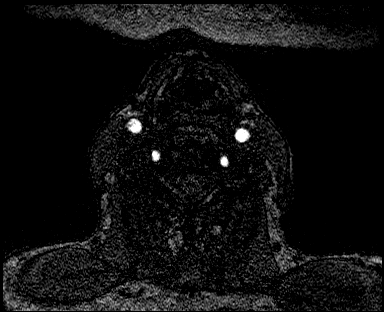
[im 34/133]
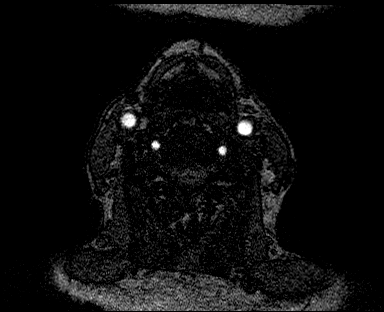
[im 50/133]
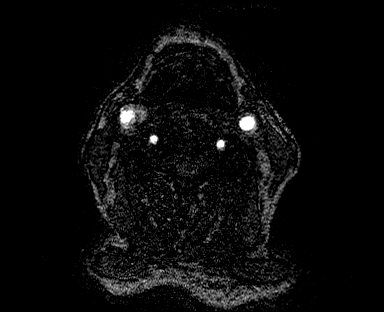
[im 67/133]
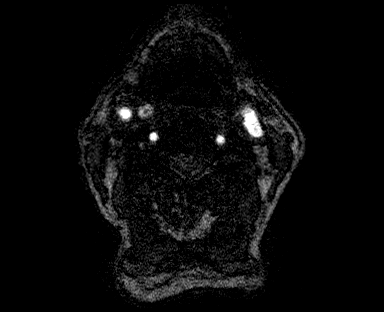
[im 83/133]
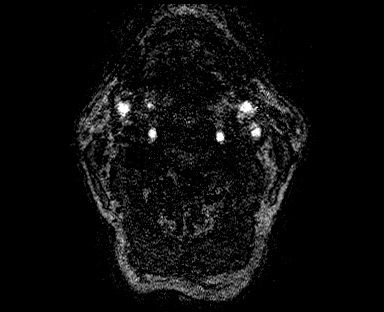
[im 100/133]
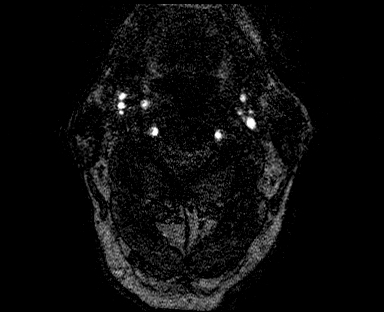
[im 116/133]
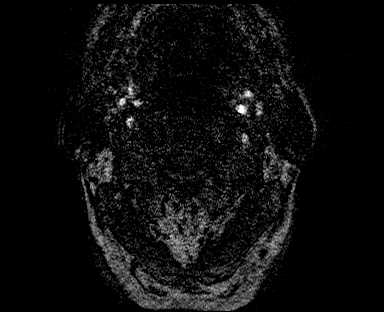
[im 133/133]
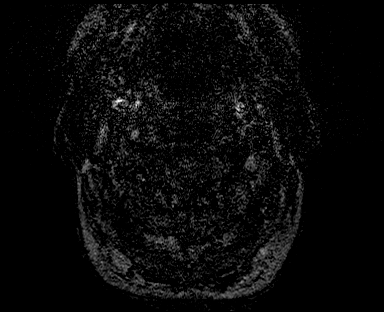

[Series 9: angio_fl3d_cor_pre_ttc=3.0s · coronal · 0.9mm · 0.99mm/px · 7 of 96 slices shown]
[im 1/96]
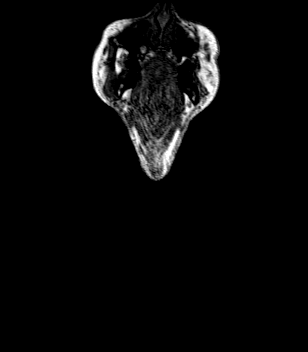
[im 16/96]
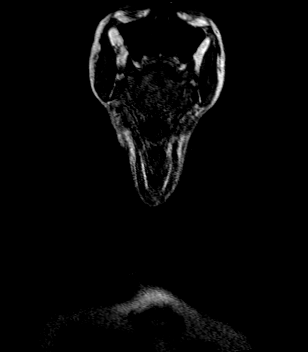
[im 32/96]
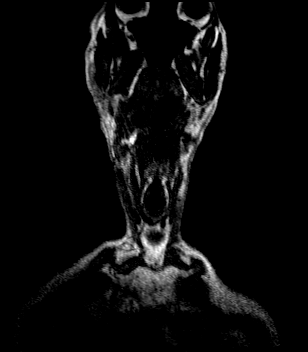
[im 48/96]
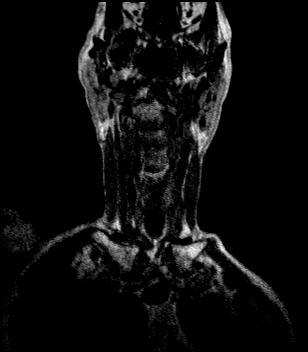
[im 64/96]
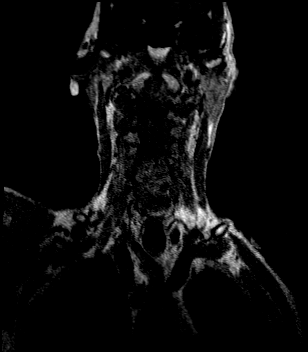
[im 80/96]
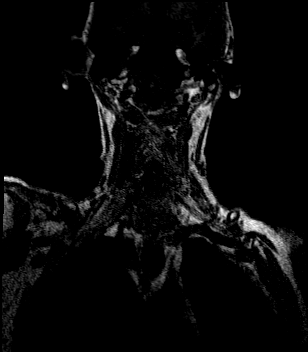
[im 96/96]
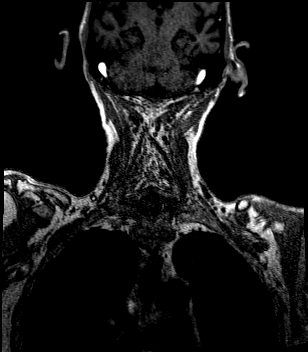

[Series 11: angio_fl3d_cor_post_ttc=3.0s · coronal · 0.9mm · 0.99mm/px · 8 of 96 slices shown (1 of 2)]
[im 1/96]
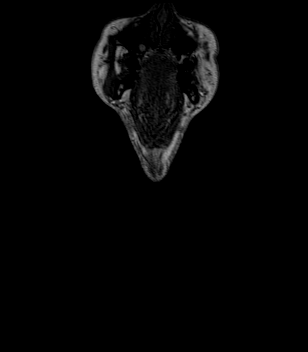
[im 14/96]
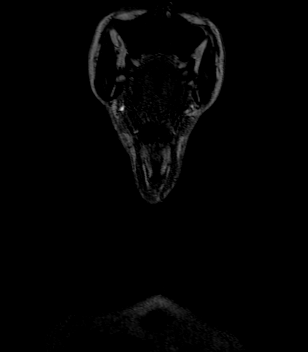
[im 28/96]
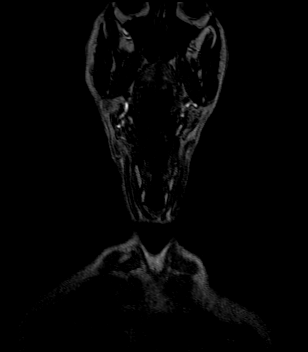
[im 41/96]
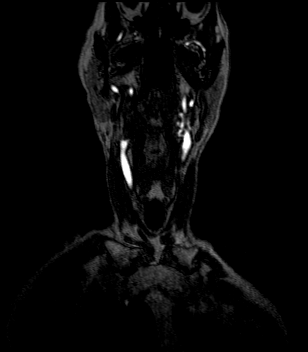
[im 55/96]
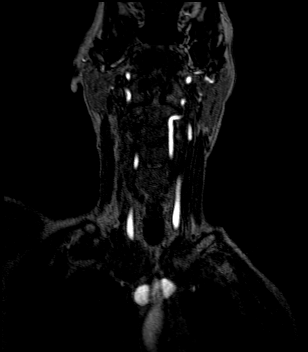
[im 68/96]
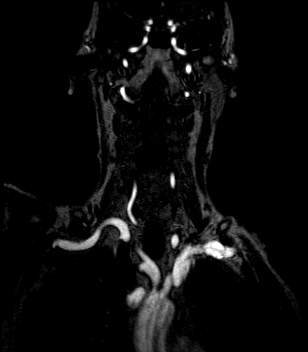
[im 82/96]
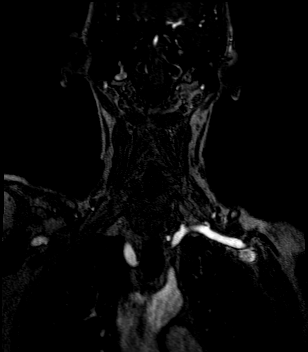
[im 96/96]
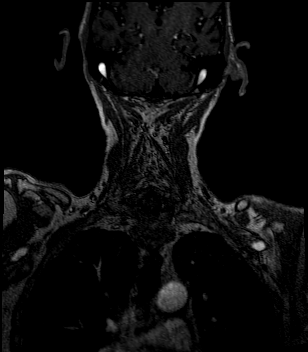

[Series 13: angio_fl3d_cor_post_ttc=3.0s_moco-adv_sub · coronal · 0.9mm · 0.99mm/px · 8 of 96 slices shown (1 of 2)]
[im 1/96]
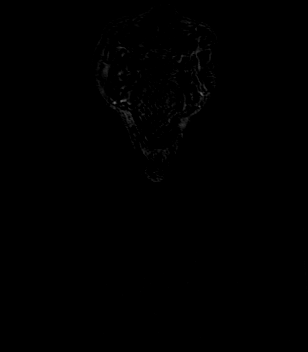
[im 14/96]
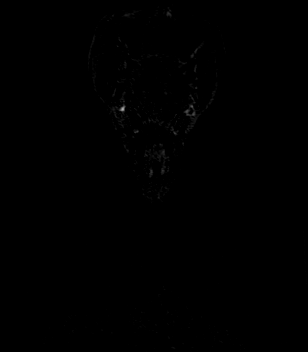
[im 28/96]
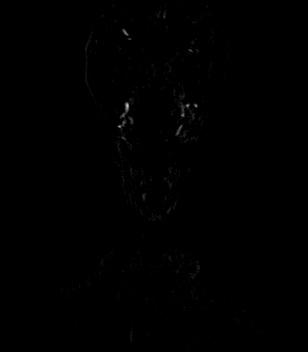
[im 41/96]
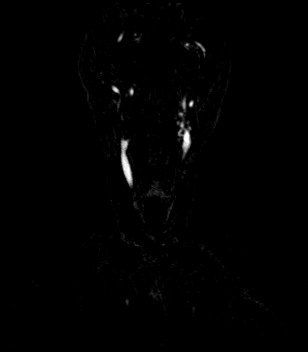
[im 55/96]
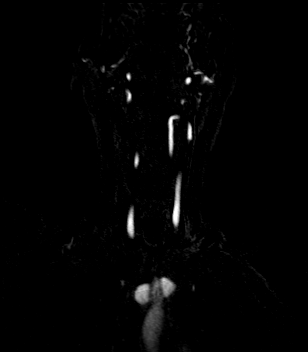
[im 68/96]
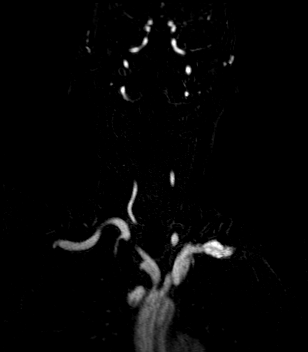
[im 82/96]
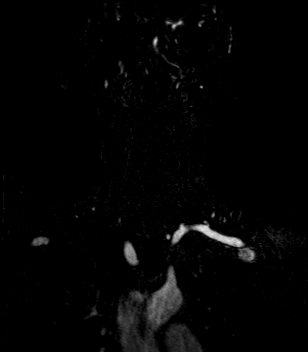
[im 96/96]
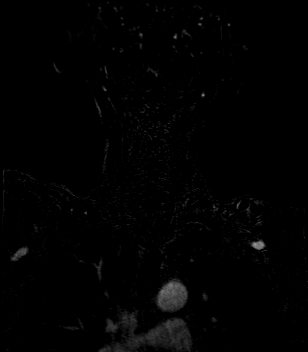

[Series 15: angio_fl3d_cor_post_ttc=3.0s · coronal · 0.9mm · 0.99mm/px · 8 of 96 slices shown (2 of 2)]
[im 1/96]
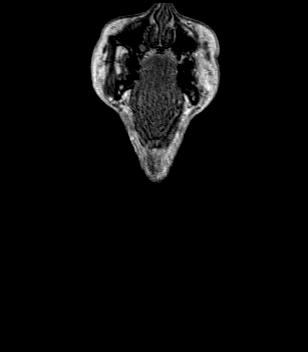
[im 14/96]
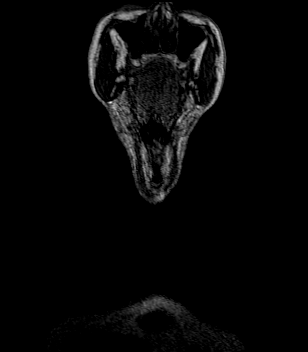
[im 28/96]
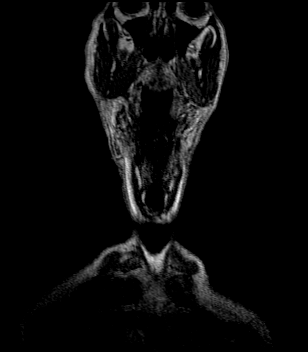
[im 41/96]
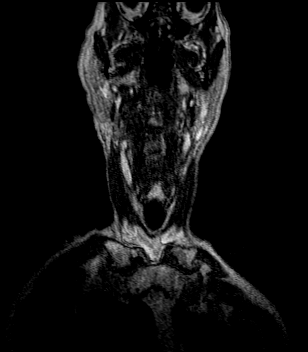
[im 55/96]
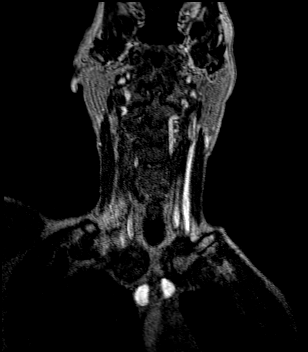
[im 68/96]
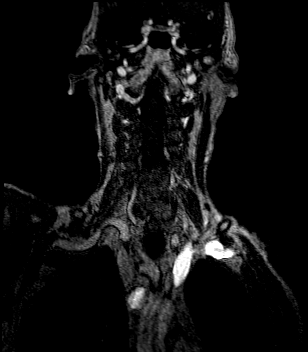
[im 82/96]
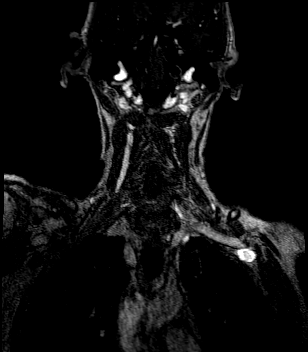
[im 96/96]
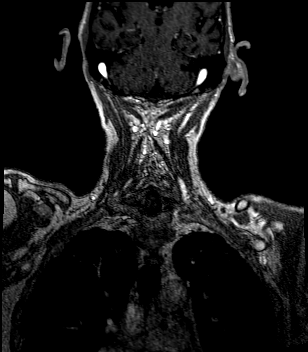

[Series 17: angio_fl3d_cor_post_ttc=3.0s_moco-adv_sub · coronal · 0.9mm · 0.99mm/px · 5 of 96 slices shown (2 of 2)]
[im 1/96]
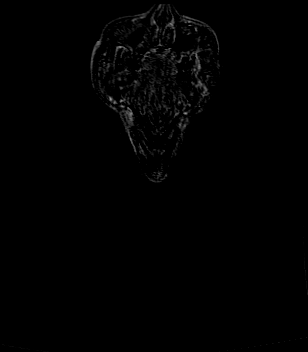
[im 14/96]
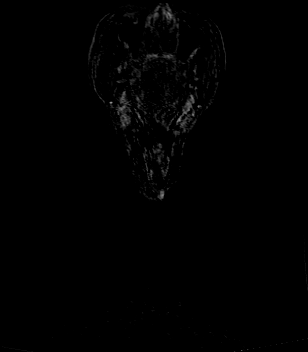
[im 28/96]
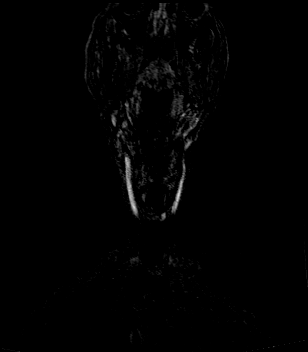
[im 41/96]
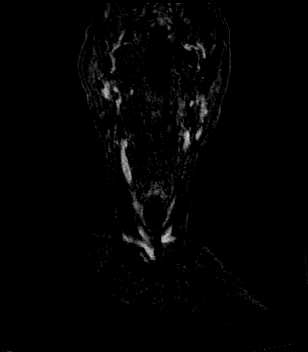
[im 55/96]
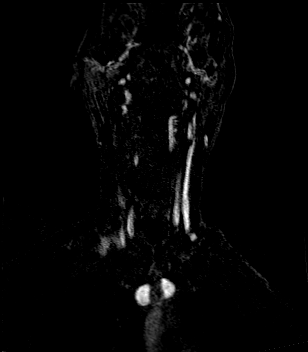

[45 of 48 positions shown; findings below may reference images not displayed]

FINDINGS: Aortic arch: Normal 3 vessel aortic branching pattern. The
visualized subclavian arteries are normal.

Right carotid system: There is severe stenosis or short segment
occlusion of the proximal right ICA with loss of contrast
enhancement and flow related enhancement. There is normal
enhancement of the distal ICA.

Left carotid system: Normal course and caliber without stenosis or
evidence of dissection.

Vertebral arteries: Codominant. Vertebral artery origins are normal.
Vertebral arteries are normal in course and caliber to the
vertebrobasilar confluence without stenosis or evidence of
dissection.
IMPRESSION: 1. Severe stenosis versus short segment occlusion of the proximal
right ICA.
2. No other hemodynamically significant stenosis of the carotid or
vertebral arteries.
